# Patient Record
Sex: Female | Born: 1950 | Race: White | Hispanic: No | Smoking: Never smoker
Health system: Southern US, Community
[De-identification: ages and names within clinical notes are randomized; demographics above are authoritative.]

## PROBLEM LIST (undated history)

## (undated) DIAGNOSIS — J45909 Unspecified asthma, uncomplicated: Secondary | ICD-10-CM

## (undated) DIAGNOSIS — M069 Rheumatoid arthritis, unspecified: Secondary | ICD-10-CM

## (undated) DIAGNOSIS — G4733 Obstructive sleep apnea (adult) (pediatric): Secondary | ICD-10-CM

## (undated) DIAGNOSIS — H409 Unspecified glaucoma: Secondary | ICD-10-CM

## (undated) DIAGNOSIS — M797 Fibromyalgia: Secondary | ICD-10-CM

## (undated) DIAGNOSIS — F419 Anxiety disorder, unspecified: Secondary | ICD-10-CM

## (undated) DIAGNOSIS — I10 Essential (primary) hypertension: Secondary | ICD-10-CM

## (undated) HISTORY — PX: CHOLECYSTECTOMY: SHX55

## (undated) HISTORY — PX: BACK SURGERY: SHX140

## (undated) HISTORY — PX: CATARACT EXTRACTION: SUR2

## (undated) HISTORY — PX: KNEE ARTHROSCOPY: SUR90

## (undated) HISTORY — PX: OTHER SURGICAL HISTORY: SHX169

## (undated) HISTORY — PX: CERVICAL LAMINECTOMY: SHX94

## (undated) HISTORY — PX: TUBAL LIGATION: SHX77

## (undated) HISTORY — PX: ECTOPIC PREGNANCY SURGERY: SHX613

---

## 1998-10-20 ENCOUNTER — Encounter: Payer: Self-pay | Admitting: Neurological Surgery

## 1998-10-22 ENCOUNTER — Encounter: Payer: Self-pay | Admitting: Neurological Surgery

## 1998-10-22 ENCOUNTER — Inpatient Hospital Stay (HOSPITAL_COMMUNITY): Admission: RE | Admit: 1998-10-22 | Discharge: 1998-10-23 | Payer: Self-pay | Admitting: Neurological Surgery

## 2001-07-26 ENCOUNTER — Encounter: Payer: Self-pay | Admitting: Internal Medicine

## 2001-07-26 ENCOUNTER — Ambulatory Visit (HOSPITAL_COMMUNITY): Admission: RE | Admit: 2001-07-26 | Discharge: 2001-07-26 | Payer: Self-pay

## 2001-08-03 ENCOUNTER — Ambulatory Visit (HOSPITAL_COMMUNITY): Admission: RE | Admit: 2001-08-03 | Discharge: 2001-08-03 | Payer: Self-pay | Admitting: Family Medicine

## 2001-08-03 ENCOUNTER — Encounter: Payer: Self-pay | Admitting: Internal Medicine

## 2001-08-27 ENCOUNTER — Encounter (HOSPITAL_COMMUNITY): Admission: RE | Admit: 2001-08-27 | Discharge: 2001-09-26 | Payer: Self-pay | Admitting: Orthopedic Surgery

## 2002-12-10 ENCOUNTER — Ambulatory Visit (HOSPITAL_COMMUNITY): Admission: RE | Admit: 2002-12-10 | Discharge: 2002-12-10 | Payer: Self-pay | Admitting: Internal Medicine

## 2003-06-13 ENCOUNTER — Encounter (HOSPITAL_COMMUNITY): Admission: RE | Admit: 2003-06-13 | Discharge: 2003-07-13 | Payer: Self-pay | Admitting: Orthopedic Surgery

## 2004-03-03 ENCOUNTER — Encounter (HOSPITAL_COMMUNITY): Admission: RE | Admit: 2004-03-03 | Discharge: 2004-03-03 | Payer: Self-pay | Admitting: Internal Medicine

## 2004-05-11 ENCOUNTER — Encounter
Admission: RE | Admit: 2004-05-11 | Discharge: 2004-08-09 | Payer: Self-pay | Admitting: Physical Medicine and Rehabilitation

## 2004-11-26 ENCOUNTER — Ambulatory Visit (HOSPITAL_COMMUNITY): Admission: RE | Admit: 2004-11-26 | Discharge: 2004-11-26 | Payer: Self-pay | Admitting: Family Medicine

## 2005-05-25 ENCOUNTER — Ambulatory Visit (HOSPITAL_COMMUNITY): Admission: RE | Admit: 2005-05-25 | Discharge: 2005-05-25 | Payer: Self-pay | Admitting: Internal Medicine

## 2006-08-20 ENCOUNTER — Ambulatory Visit (HOSPITAL_COMMUNITY): Admission: RE | Admit: 2006-08-20 | Discharge: 2006-08-20 | Payer: Self-pay | Admitting: Internal Medicine

## 2007-02-04 ENCOUNTER — Emergency Department (HOSPITAL_COMMUNITY): Admission: EM | Admit: 2007-02-04 | Discharge: 2007-02-04 | Payer: Self-pay | Admitting: Emergency Medicine

## 2008-08-15 ENCOUNTER — Ambulatory Visit (HOSPITAL_COMMUNITY): Admission: RE | Admit: 2008-08-15 | Discharge: 2008-08-15 | Payer: Self-pay | Admitting: Internal Medicine

## 2008-10-13 ENCOUNTER — Ambulatory Visit (HOSPITAL_COMMUNITY): Admission: RE | Admit: 2008-10-13 | Discharge: 2008-10-13 | Payer: Self-pay | Admitting: Ophthalmology

## 2009-03-28 ENCOUNTER — Encounter: Admission: RE | Admit: 2009-03-28 | Discharge: 2009-03-28 | Payer: Self-pay | Admitting: Internal Medicine

## 2009-07-10 ENCOUNTER — Ambulatory Visit (HOSPITAL_COMMUNITY): Admission: RE | Admit: 2009-07-10 | Discharge: 2009-07-10 | Payer: Self-pay | Admitting: Surgery

## 2010-03-07 ENCOUNTER — Encounter: Payer: Self-pay | Admitting: Internal Medicine

## 2010-03-08 ENCOUNTER — Encounter: Payer: Self-pay | Admitting: Internal Medicine

## 2010-03-17 ENCOUNTER — Encounter: Payer: Self-pay | Admitting: Orthopaedic Surgery

## 2010-05-22 LAB — BASIC METABOLIC PANEL
BUN: 6 mg/dL (ref 6–23)
CO2: 27 mEq/L (ref 19–32)
Calcium: 9.2 mg/dL (ref 8.4–10.5)
Chloride: 106 mEq/L (ref 96–112)
Creatinine, Ser: 0.63 mg/dL (ref 0.4–1.2)
GFR calc Af Amer: >60 mL/min (ref >60)

## 2010-06-01 ENCOUNTER — Other Ambulatory Visit: Payer: Self-pay | Admitting: Orthopaedic Surgery

## 2010-06-01 DIAGNOSIS — M545 Low back pain: Secondary | ICD-10-CM

## 2010-06-04 ENCOUNTER — Ambulatory Visit
Admission: RE | Admit: 2010-06-04 | Discharge: 2010-06-04 | Disposition: A | Discharge status: Home / Self Care | Payer: Medicare Other | Source: Ambulatory Visit | Attending: Orthopaedic Surgery | Admitting: Orthopaedic Surgery

## 2010-06-04 DIAGNOSIS — M545 Low back pain: Secondary | ICD-10-CM

## 2010-06-11 ENCOUNTER — Encounter (HOSPITAL_COMMUNITY)
Admission: RE | Admit: 2010-06-11 | Discharge: 2010-06-11 | Disposition: A | Discharge status: Home / Self Care | Payer: Medicare Other | Source: Ambulatory Visit | Attending: Orthopaedic Surgery | Admitting: Orthopaedic Surgery

## 2010-06-11 ENCOUNTER — Ambulatory Visit (HOSPITAL_COMMUNITY)
Admission: RE | Admit: 2010-06-11 | Discharge: 2010-06-11 | Disposition: A | Discharge status: Home / Self Care | Payer: Medicare Other | Source: Ambulatory Visit | Attending: Orthopaedic Surgery | Admitting: Orthopaedic Surgery

## 2010-06-11 ENCOUNTER — Other Ambulatory Visit (HOSPITAL_COMMUNITY): Payer: Self-pay | Admitting: Orthopaedic Surgery

## 2010-06-11 DIAGNOSIS — Z01812 Encounter for preprocedural laboratory examination: Secondary | ICD-10-CM | POA: Insufficient documentation

## 2010-06-11 DIAGNOSIS — M48061 Spinal stenosis, lumbar region without neurogenic claudication: Secondary | ICD-10-CM | POA: Insufficient documentation

## 2010-06-11 DIAGNOSIS — Z01810 Encounter for preprocedural cardiovascular examination: Secondary | ICD-10-CM | POA: Insufficient documentation

## 2010-06-11 DIAGNOSIS — Z01818 Encounter for other preprocedural examination: Secondary | ICD-10-CM | POA: Insufficient documentation

## 2010-06-11 LAB — CBC
HCT: 41 % (ref 36.0–46.0)
Hemoglobin: 14 g/dL (ref 12.0–15.0)
MCH: 30.8 pg (ref 26.0–34.0)
MCHC: 34.1 g/dL (ref 30.0–36.0)
RDW: 13.3 % (ref 11.5–15.5)

## 2010-06-11 LAB — URINALYSIS, ROUTINE W REFLEX MICROSCOPIC
Bilirubin Urine: NEGATIVE
Glucose, UA: NEGATIVE mg/dL
Hgb urine dipstick: NEGATIVE
Ketones, ur: NEGATIVE mg/dL
Protein, ur: NEGATIVE mg/dL
Urobilinogen, UA: 0.2 mg/dL (ref 0.0–1.0)

## 2010-06-11 LAB — COMPREHENSIVE METABOLIC PANEL
AST: 18 U/L (ref 0–37)
BUN: 6 mg/dL (ref 6–23)
CO2: 27 mEq/L (ref 19–32)
Chloride: 103 mEq/L (ref 96–112)
Creatinine, Ser: 0.6 mg/dL (ref 0.4–1.2)
GFR calc non Af Amer: >60 mL/min (ref >60)
Glucose, Bld: 97 mg/dL (ref 70–99)
Total Bilirubin: 0.4 mg/dL (ref 0.3–1.2)

## 2010-06-11 LAB — SURGICAL PCR SCREEN: Staphylococcus aureus: NEGATIVE

## 2010-06-14 ENCOUNTER — Ambulatory Visit (HOSPITAL_COMMUNITY)
Admission: RE | Admit: 2010-06-14 | Discharge: 2010-06-15 | Disposition: A | Discharge status: Home / Self Care | Payer: Medicare Other | Source: Ambulatory Visit | Attending: Orthopaedic Surgery | Admitting: Orthopaedic Surgery

## 2010-06-14 ENCOUNTER — Ambulatory Visit (HOSPITAL_COMMUNITY): Payer: Medicare Other

## 2010-06-14 DIAGNOSIS — J45909 Unspecified asthma, uncomplicated: Secondary | ICD-10-CM | POA: Insufficient documentation

## 2010-06-14 DIAGNOSIS — IMO0001 Reserved for inherently not codable concepts without codable children: Secondary | ICD-10-CM | POA: Insufficient documentation

## 2010-06-14 DIAGNOSIS — M5126 Other intervertebral disc displacement, lumbar region: Secondary | ICD-10-CM | POA: Insufficient documentation

## 2010-06-14 DIAGNOSIS — M48061 Spinal stenosis, lumbar region without neurogenic claudication: Secondary | ICD-10-CM | POA: Insufficient documentation

## 2010-06-14 DIAGNOSIS — E669 Obesity, unspecified: Secondary | ICD-10-CM | POA: Insufficient documentation

## 2010-06-14 DIAGNOSIS — I10 Essential (primary) hypertension: Secondary | ICD-10-CM | POA: Insufficient documentation

## 2010-07-15 NOTE — Op Note (Signed)
NAME:  [name] [name], [name]                ACCOUNT NO.:  [id number]  MEDICAL RECORD NO.:  [id number]           PATIENT TYPE:  I  LOCATION:  5015                         FACILITY:  [facility]  PHYSICIAN:  Temprance Wyre C. Ophelia Charter, M.D.    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  PROCEDURE NOTE:  L2-3 and L3-4 decompression, L2-3 microdiskectomy.  ANESTHESIA:  GOT plus Marcaine local.  ESTIMATED BLOOD LOSS:  700 mL.  A 60 year old female with progressive increased pain with the progression since 2007 with severe stenosis at L2-3 and neurogenic claudication symptoms.  MRI scan showed 5-mm canal at the 2-3 level and moderate-to-severe stenosis of the level below L3-4.  SURGEON:  Rielle Schlauch C. Ophelia Charter, MD  ASSISTANT:  Wende Neighbors, PA, present for the entire case and medically necessary.  DRAINS:  One Hemovac.  After induction of general anesthesia orotracheal intubation, the patient placed prone on chest rolls on the flat [place] table.  Back was prepped with DuraPrep.  The head was raised slightly with the patient's blood pressure being more than adequate.  Back was prepped with DuraPrep.  The area was squared with towels, sterile skin marker, Betadine, Steri-Strips, laminectomy sheets and drapes.  Spinal needle localization showed that the lower needle was little bit below the 3-4 level.  Upper level was directly over the disk at 2-3.  Midline incision was made based on spinal needle localization.  Subperiosteal dissection on the lamina.  Deep self-retaining retractors were placed.  The patient had pulsatile arterial bleeders in numerous areas with venous oozing. Slow  meticulous hemostasis was performed and combination of bone wax where bone was removed.  Thrombin-soaked Gelfoam and patties were used. Decompression was started proximally in spinal.  Lateral lumbar x-ray was taken with Kocher placed on top of the bony decompression.  This was slightly high and the lamina was thinned in L2 removing bone until the ventral aspect the lamina was thinned.  Thick chunks of ligament were left.  Bone was removed using 2- and 3-mm Kerrisons going out laterally in line with the fall way of the dura on the on the right and left side. Bone was thinned next level down for the 3-4 decompression and there were all large overhanging spurs off the facets and thick chunks of ligament.  Gelfoam was used in the gutters and then continued to be oozing throughout the case despite attempts at hemostasis.  Bone was removal.  Pedicles were palpated, and central decompression was performed at the 3-4 level.  At 2-3, there was disk herniation with some portion of the fragment extruded caudally.  Due to the tightness, initially a Therapist, nutritional was used for [employer] or [employer] #4. Annulus was incised and immediately disk came out through the defect and was suctioned up with 8- or 9-mm sucker tip.  Hockey stick was not able  to reach down into the area.  Small ball-tip right-angle probe was used as a dural separator.  Nerve root was used to tease some disk fragments and suction them up until finally 9-degree hockey stick could be passed anterior to the dura without pressure.  No spurs were removed.  Nerve root was free.  No fragment out the foramina.  Some remaining spurs were removed from the gutter running up each side.  No areas remaining compression proximally.  Decompression was performed at both 2-3 and 3-4 level and the annulus was incised on the disk on the right side. Hemovac was placed through in-and-out technique through separate stab incision left above the fascia.   Fascia was loosely approximated with 0 Vicryl interrupted, 2-0 Vicryl in the subcutaneous tissue, and then skin staple closure in order to allow for any egress of fluid to prevent hematoma formation and extradural compression.  Postop dressing and ABDs were applied and tape.  The patient tolerated the procedure well, was transferred to recovery room in stable condition.     [name] [name] [name] C. Ophelia Charter, M.D.     MCY/MEDQ  D:  06/14/2010  T:  06/15/2010  Job:  [id number]  Electronically Signed by Annell Greening M.D. on 07/15/2010 06:26:41 PM

## 2010-11-19 LAB — COMPREHENSIVE METABOLIC PANEL
AST: 14
Albumin: 2.7 — ABNORMAL LOW
Calcium: 7.9 — ABNORMAL LOW
Chloride: 106
Creatinine, Ser: 0.56
GFR calc Af Amer: >60
Sodium: 139
Total Bilirubin: 0.4

## 2010-11-19 LAB — CBC
MCV: 93.4
Platelets: 329
WBC: 10.9 — ABNORMAL HIGH

## 2010-11-19 LAB — DIFFERENTIAL
Basophils Absolute: 0
Lymphocytes Relative: 11 — ABNORMAL LOW
Lymphs Abs: 1.2
Neutro Abs: 9 — ABNORMAL HIGH

## 2010-12-21 ENCOUNTER — Other Ambulatory Visit (HOSPITAL_COMMUNITY): Payer: Self-pay | Admitting: Internal Medicine

## 2010-12-21 ENCOUNTER — Other Ambulatory Visit: Payer: Self-pay | Admitting: Internal Medicine

## 2010-12-21 DIAGNOSIS — M25559 Pain in unspecified hip: Secondary | ICD-10-CM

## 2010-12-28 ENCOUNTER — Other Ambulatory Visit: Payer: Medicare Other

## 2010-12-30 ENCOUNTER — Other Ambulatory Visit: Payer: Medicare Other

## 2011-01-05 ENCOUNTER — Other Ambulatory Visit: Payer: Medicare Other

## 2011-01-10 ENCOUNTER — Other Ambulatory Visit: Payer: Medicare Other

## 2011-01-18 ENCOUNTER — Other Ambulatory Visit: Payer: Medicare Other

## 2011-01-30 ENCOUNTER — Encounter: Payer: Self-pay | Admitting: Emergency Medicine

## 2011-01-30 ENCOUNTER — Emergency Department (HOSPITAL_COMMUNITY)
Admission: EM | Admit: 2011-01-30 | Discharge: 2011-01-30 | Disposition: A | Discharge status: Home / Self Care | Payer: Medicare Other | Attending: Emergency Medicine | Admitting: Emergency Medicine

## 2011-01-30 DIAGNOSIS — M545 Low back pain, unspecified: Secondary | ICD-10-CM | POA: Insufficient documentation

## 2011-01-30 DIAGNOSIS — M25559 Pain in unspecified hip: Secondary | ICD-10-CM | POA: Insufficient documentation

## 2011-01-30 DIAGNOSIS — G8929 Other chronic pain: Secondary | ICD-10-CM | POA: Insufficient documentation

## 2011-01-30 DIAGNOSIS — M069 Rheumatoid arthritis, unspecified: Secondary | ICD-10-CM | POA: Insufficient documentation

## 2011-01-30 DIAGNOSIS — IMO0001 Reserved for inherently not codable concepts without codable children: Secondary | ICD-10-CM | POA: Insufficient documentation

## 2011-01-30 DIAGNOSIS — M5431 Sciatica, right side: Secondary | ICD-10-CM

## 2011-01-30 DIAGNOSIS — Z79899 Other long term (current) drug therapy: Secondary | ICD-10-CM | POA: Insufficient documentation

## 2011-01-30 DIAGNOSIS — I10 Essential (primary) hypertension: Secondary | ICD-10-CM | POA: Insufficient documentation

## 2011-01-30 HISTORY — DX: Fibromyalgia: M79.7

## 2011-01-30 HISTORY — DX: Obstructive sleep apnea (adult) (pediatric): G47.33

## 2011-01-30 HISTORY — DX: Essential (primary) hypertension: I10

## 2011-01-30 HISTORY — DX: Rheumatoid arthritis, unspecified: M06.9

## 2011-01-30 MED ORDER — NAPROXEN 500 MG PO TABS: 500.0000 mg | ORAL_TABLET | Freq: Two times a day (BID) | ORAL | Status: DC

## 2011-01-30 MED ORDER — HYDROMORPHONE HCL PF 2 MG/ML IJ SOLN
2.0000 mg | Freq: Once | INTRAMUSCULAR | Status: AC
  Administered 2011-01-30: 2 mg via INTRAMUSCULAR
  Filled 2011-01-30: qty 1

## 2011-01-30 MED ORDER — PREDNISONE 50 MG PO TABS: 50.0000 mg | ORAL_TABLET | Freq: Every day | ORAL | Status: AC

## 2011-01-30 MED ORDER — OXYCODONE-ACETAMINOPHEN 5-325 MG PO TABS: 2.0000 | ORAL_TABLET | ORAL | Status: AC | PRN

## 2011-01-30 NOTE — ED Notes (Signed)
Patient with c/o lower back pain, neck pain, and bilateral hip pain. Patient with history of back surgery in April. Patient did some work yesterday and woke up today with pain.

## 2011-01-30 NOTE — ED Provider Notes (Signed)
History    Scribed for Donnetta Hutching, MD, the patient was seen in room APAH1/APAH1. This chart was scribed by Katha Cabal.   CSN: [id number] Arrival date & time: 01/30/2011  6:32 PM   First MD Initiated Contact with Patient 01/30/11 1933      Chief Complaint  Patient presents with  . Back Pain  . Hip Pain    (Consider location/radiation/quality/duration/timing/severity/associated sxs/prior treatment) Patient is a 60 y.o. female presenting with back pain and hip pain. The history is provided by the patient. No language interpreter was used.  Back Pain  This is a chronic problem. The current episode started 12 to 24 hours ago. The problem occurs constantly. The problem has not changed since onset.The pain is present in the lumbar spine and gluteal region. The quality of the pain is described as aching. The pain is moderate. Exacerbated by: walking  She has tried nothing for the symptoms.  Hip Pain This is a chronic problem. The problem occurs constantly. The problem has not changed since onset.The symptoms are aggravated by walking.   Dr. Ophelia Charter performed back surgery in April.  Patient reports right buttocks pain.  Patient received shot from Dr. Ophelia Charter for pain about a month and a half ago with mild relief.  Patient states she took nothing for pain.    Past Medical History  Diagnosis Date  . Fibromyalgia   . Rheumatoid arthritis   . Hypertension   . OSA (obstructive sleep apnea)   . Ectopic pregnancy     Past Surgical History  Procedure Date  . Back surgery   . Tubal ligation   . Ectopic pregnancy surgery   . Vericose vein surgery   . Knee arthroscopy   . Cataract extraction   . Cholecystectomy     No family history on file.  History  Substance Use Topics  . Smoking status: Former Games developer  . Smokeless tobacco: Not on file  . Alcohol Use: No    OB History    Grav Para Term Preterm Abortions TAB SAB Ect Mult Living                  Review of Systems    Musculoskeletal: Positive for back pain.  All other systems reviewed and are negative.    Allergies  Review of patient's allergies indicates no known allergies.  Home Medications   Current Outpatient Rx  Name Route Sig Dispense Refill  . NAPROXEN 500 MG PO TABS Oral Take 1 tablet (500 mg total) by mouth 2 (two) times daily. 20 tablet 0  . OXYCODONE-ACETAMINOPHEN 5-325 MG PO TABS Oral Take 2 tablets by mouth every 4 (four) hours as needed for pain. 20 tablet 0  . PREDNISONE 50 MG PO TABS Oral Take 1 tablet (50 mg total) by mouth daily. 7 tablet 1    BP 142/87  Pulse 79  Temp(Src) 97.9 F (36.6 C) (Oral)  Resp 18  Ht 5' 3.5" (1.613 m)  Wt 190 lb (86.183 kg)  BMI 33.13 kg/m2  SpO2 97%  Physical Exam  Nursing note and vitals reviewed. Constitutional: She is oriented to person, place, and time. She appears well-developed and well-nourished. No distress.  HENT:  Head: Normocephalic and atraumatic.  Eyes: Conjunctivae and EOM are normal.  Neck: Neck supple.  Cardiovascular: Normal rate and regular rhythm.   No murmur heard. Pulmonary/Chest: Effort normal. No respiratory distress.  Abdominal: Soft. There is no tenderness.  Musculoskeletal: Normal range of motion. She exhibits no edema.  Right buttocks pain   Neurological: She is alert and oriented to person, place, and time. No sensory deficit.  Skin: Skin is warm and dry. No rash noted.  Psychiatric: She has a normal mood and affect. Her behavior is normal.    ED Course  Procedures (including critical care time)   DIAGNOSTIC STUDIES: Oxygen Saturation is 97% on room air, normal by my interpretation.     COORDINATION OF CARE: 8:11 PM  Physical exam complete.  Plan to discharge patient home.  Advised patient to follow up with Dr. Ophelia Charter.  Patient agrees with plan.      LABS / RADIOLOGY:   Labs Reviewed - No data to display No results found.   1. Sciatica of right side       MDM  Status post back  surgery April 30 by Dr Ophelia Charter.    Now with right sciatic pain. No bowel or bladder incontinence. Ambulatory. Will followup with orthopedic doctor this week       I personally performed the services described in this documentation, which was scribed in my presence. The recorded information has been reviewed and considered.    Donnetta Hutching, MD 01/30/11 2034

## 2011-01-30 NOTE — ED Notes (Signed)
Pt is alert and oriented x 4 with respirations even and unlabored.  NAD at this time.  Discharge instructions and Rx x 3 reviewed with pt and pt verbalized understanding.  Pt ambulated to POV and husband to transport pt home.

## 2011-02-16 DIAGNOSIS — Z79899 Other long term (current) drug therapy: Secondary | ICD-10-CM | POA: Diagnosis not present

## 2011-02-18 ENCOUNTER — Ambulatory Visit
Admission: RE | Admit: 2011-02-18 | Discharge: 2011-02-18 | Disposition: A | Discharge status: Home / Self Care | Payer: Medicare Other | Source: Ambulatory Visit | Attending: Internal Medicine | Admitting: Internal Medicine

## 2011-02-18 DIAGNOSIS — M25559 Pain in unspecified hip: Secondary | ICD-10-CM | POA: Diagnosis not present

## 2011-02-18 DIAGNOSIS — M25459 Effusion, unspecified hip: Secondary | ICD-10-CM | POA: Diagnosis not present

## 2011-02-22 DIAGNOSIS — Z6841 Body Mass Index (BMI) 40.0 and over, adult: Secondary | ICD-10-CM | POA: Diagnosis not present

## 2011-02-22 DIAGNOSIS — G4700 Insomnia, unspecified: Secondary | ICD-10-CM | POA: Diagnosis not present

## 2011-02-22 DIAGNOSIS — I10 Essential (primary) hypertension: Secondary | ICD-10-CM | POA: Diagnosis not present

## 2011-02-22 DIAGNOSIS — E669 Obesity, unspecified: Secondary | ICD-10-CM | POA: Diagnosis not present

## 2011-02-22 DIAGNOSIS — M25559 Pain in unspecified hip: Secondary | ICD-10-CM | POA: Diagnosis not present

## 2011-02-22 DIAGNOSIS — M169 Osteoarthritis of hip, unspecified: Secondary | ICD-10-CM | POA: Diagnosis not present

## 2011-02-22 DIAGNOSIS — J019 Acute sinusitis, unspecified: Secondary | ICD-10-CM | POA: Diagnosis not present

## 2011-03-08 DIAGNOSIS — Z79899 Other long term (current) drug therapy: Secondary | ICD-10-CM | POA: Diagnosis not present

## 2011-03-14 ENCOUNTER — Ambulatory Visit (HOSPITAL_COMMUNITY)
Admission: RE | Admit: 2011-03-14 | Discharge: 2011-03-14 | Disposition: A | Discharge status: Home / Self Care | Payer: Medicare Other | Source: Ambulatory Visit | Attending: Internal Medicine | Admitting: Internal Medicine

## 2011-03-14 ENCOUNTER — Other Ambulatory Visit (HOSPITAL_COMMUNITY): Payer: Self-pay | Admitting: Internal Medicine

## 2011-03-14 DIAGNOSIS — R609 Edema, unspecified: Secondary | ICD-10-CM | POA: Diagnosis not present

## 2011-03-14 DIAGNOSIS — M79609 Pain in unspecified limb: Secondary | ICD-10-CM | POA: Diagnosis not present

## 2011-03-14 DIAGNOSIS — I82819 Embolism and thrombosis of superficial veins of unspecified lower extremities: Secondary | ICD-10-CM | POA: Insufficient documentation

## 2011-03-14 DIAGNOSIS — M7989 Other specified soft tissue disorders: Secondary | ICD-10-CM | POA: Insufficient documentation

## 2011-03-21 DIAGNOSIS — Z79899 Other long term (current) drug therapy: Secondary | ICD-10-CM | POA: Diagnosis not present

## 2011-03-30 DIAGNOSIS — Z79899 Other long term (current) drug therapy: Secondary | ICD-10-CM | POA: Diagnosis not present

## 2011-03-31 DIAGNOSIS — M169 Osteoarthritis of hip, unspecified: Secondary | ICD-10-CM | POA: Diagnosis not present

## 2011-03-31 DIAGNOSIS — M25559 Pain in unspecified hip: Secondary | ICD-10-CM | POA: Diagnosis not present

## 2011-04-06 DIAGNOSIS — Z79899 Other long term (current) drug therapy: Secondary | ICD-10-CM | POA: Diagnosis not present

## 2011-05-12 DIAGNOSIS — M169 Osteoarthritis of hip, unspecified: Secondary | ICD-10-CM | POA: Diagnosis not present

## 2011-06-23 DIAGNOSIS — M169 Osteoarthritis of hip, unspecified: Secondary | ICD-10-CM | POA: Diagnosis not present

## 2011-06-23 DIAGNOSIS — M1610 Unilateral primary osteoarthritis, unspecified hip: Secondary | ICD-10-CM | POA: Diagnosis not present

## 2011-06-23 DIAGNOSIS — I10 Essential (primary) hypertension: Secondary | ICD-10-CM | POA: Diagnosis not present

## 2011-06-23 DIAGNOSIS — M25559 Pain in unspecified hip: Secondary | ICD-10-CM | POA: Diagnosis not present

## 2011-08-05 DIAGNOSIS — M76899 Other specified enthesopathies of unspecified lower limb, excluding foot: Secondary | ICD-10-CM | POA: Diagnosis not present

## 2011-09-14 DIAGNOSIS — M169 Osteoarthritis of hip, unspecified: Secondary | ICD-10-CM | POA: Diagnosis not present

## 2011-09-14 DIAGNOSIS — M25559 Pain in unspecified hip: Secondary | ICD-10-CM | POA: Diagnosis not present

## 2011-11-08 DIAGNOSIS — M25559 Pain in unspecified hip: Secondary | ICD-10-CM | POA: Diagnosis not present

## 2011-11-08 DIAGNOSIS — M169 Osteoarthritis of hip, unspecified: Secondary | ICD-10-CM | POA: Diagnosis not present

## 2011-12-28 DIAGNOSIS — H538 Other visual disturbances: Secondary | ICD-10-CM | POA: Diagnosis not present

## 2011-12-28 DIAGNOSIS — H2510 Age-related nuclear cataract, unspecified eye: Secondary | ICD-10-CM | POA: Diagnosis not present

## 2011-12-29 DIAGNOSIS — M25559 Pain in unspecified hip: Secondary | ICD-10-CM | POA: Diagnosis not present

## 2011-12-29 DIAGNOSIS — M545 Low back pain: Secondary | ICD-10-CM | POA: Diagnosis not present

## 2011-12-29 DIAGNOSIS — M169 Osteoarthritis of hip, unspecified: Secondary | ICD-10-CM | POA: Diagnosis not present

## 2011-12-29 DIAGNOSIS — M76899 Other specified enthesopathies of unspecified lower limb, excluding foot: Secondary | ICD-10-CM | POA: Diagnosis not present

## 2011-12-29 DIAGNOSIS — M25569 Pain in unspecified knee: Secondary | ICD-10-CM | POA: Diagnosis not present

## 2012-01-02 ENCOUNTER — Encounter (HOSPITAL_COMMUNITY): Payer: Self-pay | Admitting: Pharmacy Technician

## 2012-01-09 NOTE — Patient Instructions (Addendum)
Your procedure is scheduled on: 01/16/2012  Report to [facility] at  615       AM.  Call this number if you have problems the morning of surgery: [phone]   Do not eat food or drink liquids :After Midnight.      Take these medicines the morning of surgery with A SIP OF WATER: proventil,fioricet, cardiazem,allegra   Do not wear jewelry, make-up or nail polish.  Do not wear lotions, powders, or perfumes. You may wear deodorant.  Do not shave 48 hours prior to surgery.  Do not bring valuables to the hospital.  Contacts, dentures or bridgework may not be worn into surgery.  Leave suitcase in the car. After surgery it may be brought to your room.  For patients admitted to the hospital, checkout time is 11:00 AM the day of discharge.   Patients discharged the day of surgery will not be allowed to drive home.  :     Please read over the following fact sheets that you were given: Coughing and Deep Breathing, Surgical Site Infection Prevention, Anesthesia Post-op Instructions and Care and Recovery After Surgery    Cataract A cataract is a clouding of the lens of the eye. When a lens becomes cloudy, vision is reduced based on the degree and nature of the clouding. Many cataracts reduce vision to some degree. Some cataracts make people more near-sighted as they develop. Other cataracts increase glare. Cataracts that are ignored and become worse can sometimes look white. The white color can be seen through the pupil. CAUSES   Aging. However, cataracts may occur at any age, even in newborns.   Certain drugs.   Trauma to the eye.   Certain diseases such as diabetes.   Specific eye diseases such as chronic inflammation inside the eye or a sudden attack of a rare form of glaucoma.   Inherited or acquired medical problems.  SYMPTOMS   Gradual, progressive drop in vision in the affected eye.   Severe, rapid visual loss. This most often happens when trauma is the cause.  DIAGNOSIS  To detect  a cataract, an eye doctor examines the lens. Cataracts are best diagnosed with an exam of the eyes with the pupils enlarged (dilated) by drops.  TREATMENT  For an early cataract, vision may improve by using different eyeglasses or stronger lighting. If that does not help your vision, surgery is the only effective treatment. A cataract needs to be surgically removed when vision loss interferes with your everyday activities, such as driving, reading, or watching TV. A cataract may also have to be removed if it prevents examination or treatment of another eye problem. Surgery removes the cloudy lens and usually replaces it with a substitute lens (intraocular lens, IOL).  At a time when both you and your doctor agree, the cataract will be surgically removed. If you have cataracts in both eyes, only one is usually removed at a time. This allows the operated eye to heal and be out of danger from any possible problems after surgery (such as infection or poor wound healing). In rare cases, a cataract may be doing damage to your eye. In these cases, your caregiver may advise surgical removal right away. The vast majority of people who have cataract surgery have better vision afterward. HOME CARE INSTRUCTIONS  If you are not planning surgery, you may be asked to do the following:  Use different eyeglasses.   Use stronger or brighter lighting.   Ask your eye doctor  about reducing your medicine dose or changing medicines if it is thought that a medicine caused your cataract. Changing medicines does not make the cataract go away on its own.   Become familiar with your surroundings. Poor vision can lead to injury. Avoid bumping into things on the affected side. You are at a higher risk for tripping or falling.   Exercise extreme care when driving or operating machinery.   Wear sunglasses if you are sensitive to bright light or experiencing problems with glare.  SEEK IMMEDIATE MEDICAL CARE IF:   You have a  worsening or sudden vision loss.   You notice redness, swelling, or increasing pain in the eye.   You have a fever.  Document Released: 01/31/2005 Document Revised: 01/20/2011 Document Reviewed: 09/24/2010 [facility] Patient Information 2012 [place], [place].PATIENT INSTRUCTIONS POST-ANESTHESIA  IMMEDIATELY FOLLOWING SURGERY:  Do not drive or operate machinery for the first twenty four hours after surgery.  Do not make any important decisions for twenty four hours after surgery or while taking narcotic pain medications or sedatives.  If you develop intractable nausea and vomiting or a severe headache please notify your doctor immediately.  FOLLOW-UP:  Please make an appointment with your surgeon as instructed. You do not need to follow up with anesthesia unless specifically instructed to do so.  WOUND CARE INSTRUCTIONS (if applicable):  Keep a dry clean dressing on the anesthesia/puncture wound site if there is drainage.  Once the wound has quit draining you may leave it open to air.  Generally you should leave the bandage intact for twenty four hours unless there is drainage.  If the epidural site drains for more than 36-48 hours please call the anesthesia department.  QUESTIONS?:  Please feel free to call your physician or the hospital operator if you have any questions, and they will be happy to assist you.

## 2012-01-10 ENCOUNTER — Other Ambulatory Visit: Payer: Self-pay

## 2012-01-10 ENCOUNTER — Encounter (HOSPITAL_COMMUNITY): Payer: Self-pay

## 2012-01-10 ENCOUNTER — Encounter (HOSPITAL_COMMUNITY)
Admission: RE | Admit: 2012-01-10 | Discharge: 2012-01-10 | Disposition: A | Discharge status: Home / Self Care | Payer: Medicare Other | Source: Ambulatory Visit | Attending: Ophthalmology | Admitting: Ophthalmology

## 2012-01-10 DIAGNOSIS — J45909 Unspecified asthma, uncomplicated: Secondary | ICD-10-CM | POA: Diagnosis not present

## 2012-01-10 DIAGNOSIS — Z01812 Encounter for preprocedural laboratory examination: Secondary | ICD-10-CM | POA: Diagnosis not present

## 2012-01-10 DIAGNOSIS — I10 Essential (primary) hypertension: Secondary | ICD-10-CM | POA: Diagnosis not present

## 2012-01-10 DIAGNOSIS — Z01810 Encounter for preprocedural cardiovascular examination: Secondary | ICD-10-CM | POA: Diagnosis not present

## 2012-01-10 DIAGNOSIS — H2510 Age-related nuclear cataract, unspecified eye: Secondary | ICD-10-CM | POA: Diagnosis not present

## 2012-01-10 HISTORY — DX: Anxiety disorder, unspecified: F41.9

## 2012-01-10 HISTORY — DX: Unspecified glaucoma: H40.9

## 2012-01-10 HISTORY — DX: Unspecified asthma, uncomplicated: J45.909

## 2012-01-10 LAB — HEMOGLOBIN AND HEMATOCRIT, BLOOD
HCT: 35.4 % — ABNORMAL LOW (ref 36.0–46.0)
Hemoglobin: 11.1 g/dL — ABNORMAL LOW (ref 12.0–15.0)

## 2012-01-10 LAB — BASIC METABOLIC PANEL
CO2: 30 mEq/L (ref 19–32)
Chloride: 101 mEq/L (ref 96–112)
Glucose, Bld: 102 mg/dL — ABNORMAL HIGH (ref 70–99)
Sodium: 143 mEq/L (ref 135–145)

## 2012-01-10 MED ORDER — CYCLOPENTOLATE-PHENYLEPHRINE 0.2-1 % OP SOLN: 1.0000 [drp] | OPHTHALMIC | Status: DC

## 2012-01-10 MED ORDER — LIDOCAINE HCL 3.5 % OP GEL: 1.0000 "application " | Freq: Once | OPHTHALMIC | Status: DC

## 2012-01-10 MED ORDER — CYCLOPENTOLATE-PHENYLEPHRINE 0.2-1 % OP SOLN
OPHTHALMIC | Status: AC
  Filled 2012-01-10: qty 2

## 2012-01-10 MED ORDER — PHENYLEPHRINE HCL 2.5 % OP SOLN: 1.0000 [drp] | OPHTHALMIC | Status: DC

## 2012-01-10 MED ORDER — TETRACAINE HCL 0.5 % OP SOLN: 1.0000 [drp] | OPHTHALMIC | Status: DC

## 2012-01-16 ENCOUNTER — Encounter (HOSPITAL_COMMUNITY): Payer: Self-pay | Admitting: Anesthesiology

## 2012-01-16 ENCOUNTER — Ambulatory Visit (HOSPITAL_COMMUNITY)
Admission: RE | Admit: 2012-01-16 | Discharge: 2012-01-16 | Disposition: A | Discharge status: Home / Self Care | Payer: Medicare Other | Source: Ambulatory Visit | Attending: Ophthalmology | Admitting: Ophthalmology

## 2012-01-16 ENCOUNTER — Encounter (HOSPITAL_COMMUNITY): Payer: Self-pay | Admitting: *Deleted

## 2012-01-16 ENCOUNTER — Encounter (HOSPITAL_COMMUNITY)
Admission: RE | Disposition: A | Discharge status: Home / Self Care | Payer: Self-pay | Source: Ambulatory Visit | Attending: Ophthalmology

## 2012-01-16 ENCOUNTER — Ambulatory Visit (HOSPITAL_COMMUNITY): Payer: Medicare Other | Admitting: Anesthesiology

## 2012-01-16 DIAGNOSIS — I10 Essential (primary) hypertension: Secondary | ICD-10-CM | POA: Insufficient documentation

## 2012-01-16 DIAGNOSIS — Z01810 Encounter for preprocedural cardiovascular examination: Secondary | ICD-10-CM | POA: Insufficient documentation

## 2012-01-16 DIAGNOSIS — H538 Other visual disturbances: Secondary | ICD-10-CM | POA: Diagnosis not present

## 2012-01-16 DIAGNOSIS — Z01812 Encounter for preprocedural laboratory examination: Secondary | ICD-10-CM | POA: Diagnosis not present

## 2012-01-16 DIAGNOSIS — H2510 Age-related nuclear cataract, unspecified eye: Secondary | ICD-10-CM | POA: Diagnosis not present

## 2012-01-16 DIAGNOSIS — H269 Unspecified cataract: Secondary | ICD-10-CM | POA: Diagnosis not present

## 2012-01-16 DIAGNOSIS — J45909 Unspecified asthma, uncomplicated: Secondary | ICD-10-CM | POA: Diagnosis not present

## 2012-01-16 HISTORY — PX: CATARACT EXTRACTION W/PHACO: SHX586

## 2012-01-16 SURGERY — PHACOEMULSIFICATION, CATARACT, WITH IOL INSERTION
Anesthesia: Monitor Anesthesia Care | Site: Eye | Laterality: Right | Wound class: Clean

## 2012-01-16 MED ORDER — LIDOCAINE 3.5 % OP GEL OPTIME - NO CHARGE
OPHTHALMIC | Status: DC | PRN
  Administered 2012-01-16: 1 [drp] via OPHTHALMIC

## 2012-01-16 MED ORDER — LIDOCAINE HCL 3.5 % OP GEL: 1.0000 "application " | Freq: Once | OPHTHALMIC | Status: DC

## 2012-01-16 MED ORDER — KETOROLAC TROMETHAMINE 0.5 % OP SOLN
1.0000 [drp] | Freq: Once | OPHTHALMIC | Status: AC
  Administered 2012-01-16: 1 [drp] via OPHTHALMIC

## 2012-01-16 MED ORDER — TETRACAINE HCL 0.5 % OP SOLN: 1.0000 [drp] | Freq: Once | OPHTHALMIC | Status: DC

## 2012-01-16 MED ORDER — PROPOFOL 10 MG/ML IV EMUL
INTRAVENOUS | Status: AC
  Filled 2012-01-16: qty 20

## 2012-01-16 MED ORDER — MIDAZOLAM HCL 2 MG/2ML IJ SOLN
INTRAMUSCULAR | Status: AC
  Filled 2012-01-16: qty 2

## 2012-01-16 MED ORDER — MIDAZOLAM HCL 2 MG/2ML IJ SOLN
INTRAMUSCULAR | Status: AC
  Filled 2012-01-16: qty 4

## 2012-01-16 MED ORDER — GATIFLOXACIN 0.5 % OP SOLN
OPHTHALMIC | Status: AC
  Filled 2012-01-16: qty 2.5

## 2012-01-16 MED ORDER — GATIFLOXACIN 0.5 % OP SOLN OPTIME - NO CHARGE
OPHTHALMIC | Status: DC | PRN
  Administered 2012-01-16: 2 [drp] via OPHTHALMIC

## 2012-01-16 MED ORDER — KETOROLAC TROMETHAMINE 0.5 % OP SOLN
OPHTHALMIC | Status: AC
  Filled 2012-01-16: qty 5

## 2012-01-16 MED ORDER — GATIFLOXACIN 0.5 % OP SOLN
1.0000 [drp] | Freq: Once | OPHTHALMIC | Status: AC
  Administered 2012-01-16: 1 [drp] via OPHTHALMIC

## 2012-01-16 MED ORDER — EPINEPHRINE HCL 1 MG/ML IJ SOLN
INTRAOCULAR | Status: DC | PRN
  Administered 2012-01-16: 08:00:00

## 2012-01-16 MED ORDER — MIDAZOLAM HCL 2 MG/2ML IJ SOLN
1.0000 mg | INTRAMUSCULAR | Status: DC | PRN
  Administered 2012-01-16 (×2): 2 mg via INTRAVENOUS

## 2012-01-16 MED ORDER — LIDOCAINE HCL 3.5 % OP GEL
OPHTHALMIC | Status: AC
  Filled 2012-01-16: qty 5

## 2012-01-16 MED ORDER — NA HYALUR & NA CHOND-NA HYALUR 0.55-0.5 ML IO KIT
PACK | INTRAOCULAR | Status: DC | PRN
  Administered 2012-01-16: 1 via OPHTHALMIC

## 2012-01-16 MED ORDER — CYCLOPENTOLATE-PHENYLEPHRINE 0.2-1 % OP SOLN
1.0000 [drp] | Freq: Once | OPHTHALMIC | Status: AC
  Administered 2012-01-16: 1 [drp] via OPHTHALMIC

## 2012-01-16 MED ORDER — PROPOFOL 10 MG/ML IV BOLUS
INTRAVENOUS | Status: DC | PRN
  Administered 2012-01-16 (×6): 10 mg via INTRAVENOUS

## 2012-01-16 MED ORDER — MIDAZOLAM HCL 5 MG/5ML IJ SOLN
INTRAMUSCULAR | Status: DC | PRN
  Administered 2012-01-16: 2 mg via INTRAVENOUS

## 2012-01-16 MED ORDER — TETRACAINE 0.5 % OP SOLN OPTIME - NO CHARGE
OPHTHALMIC | Status: DC | PRN
  Administered 2012-01-16: 2 [drp] via OPHTHALMIC

## 2012-01-16 MED ORDER — EPINEPHRINE HCL 1 MG/ML IJ SOLN
INTRAMUSCULAR | Status: AC
  Filled 2012-01-16: qty 1

## 2012-01-16 MED ORDER — TETRACAINE HCL 0.5 % OP SOLN
OPHTHALMIC | Status: AC
  Filled 2012-01-16: qty 2

## 2012-01-16 MED ORDER — BSS IO SOLN
INTRAOCULAR | Status: DC | PRN
  Administered 2012-01-16: 15 mL via INTRAOCULAR

## 2012-01-16 MED ORDER — LIDOCAINE HCL (PF) 1 % IJ SOLN
INTRAMUSCULAR | Status: AC
  Filled 2012-01-16: qty 5

## 2012-01-16 MED ORDER — LIDOCAINE HCL 1 % IJ SOLN
INTRAMUSCULAR | Status: DC | PRN
  Administered 2012-01-16: 20 mg via INTRADERMAL

## 2012-01-16 MED ORDER — LACTATED RINGERS IV SOLN
INTRAVENOUS | Status: DC
  Administered 2012-01-16: 1000 mL via INTRAVENOUS

## 2012-01-16 MED ORDER — CYCLOPENTOLATE-PHENYLEPHRINE 0.2-1 % OP SOLN
OPHTHALMIC | Status: AC
  Filled 2012-01-16: qty 2

## 2012-01-16 SURGICAL SUPPLY — 29 items
CAPSULAR TENSION RING-AMO (OPHTHALMIC RELATED) IMPLANT
CLOTH BEACON ORANGE TIMEOUT ST (SAFETY) ×1 IMPLANT
GLOVE BIO SURGEON STRL SZ7.5 (GLOVE) IMPLANT
GLOVE BIOGEL M 6.5 STRL (GLOVE) IMPLANT
GLOVE BIOGEL PI IND STRL 6.5 (GLOVE) IMPLANT
GLOVE BIOGEL PI IND STRL 7.0 (GLOVE) IMPLANT
GLOVE BIOGEL PI INDICATOR 6.5 (GLOVE) ×2
GLOVE BIOGEL PI INDICATOR 7.0 (GLOVE)
GLOVE ECLIPSE 6.5 STRL STRAW (GLOVE) IMPLANT
GLOVE ECLIPSE 7.5 STRL STRAW (GLOVE) IMPLANT
GLOVE EXAM NITRILE LRG STRL (GLOVE) IMPLANT
GLOVE EXAM NITRILE MD LF STRL (GLOVE) ×1 IMPLANT
GLOVE SKINSENSE NS SZ6.5 (GLOVE)
GLOVE SKINSENSE NS SZ7.0 (GLOVE)
GLOVE SKINSENSE STRL SZ6.5 (GLOVE) IMPLANT
GLOVE SKINSENSE STRL SZ7.0 (GLOVE) IMPLANT
GOWN SRG XL XLNG 56XLVL 4 (GOWN DISPOSABLE) IMPLANT
GOWN STRL NON-REIN XL XLG LVL4 (GOWN DISPOSABLE) ×2
INST SET CATARACT ~~LOC~~ (KITS) ×2 IMPLANT
KIT VITRECTOMY (OPHTHALMIC RELATED) IMPLANT
PAD ARMBOARD 7.5X6 YLW CONV (MISCELLANEOUS) ×1 IMPLANT
PROC W NO LENS (INTRAOCULAR LENS)
PROC W SPEC LENS (INTRAOCULAR LENS)
PROCESS W NO LENS (INTRAOCULAR LENS) IMPLANT
PROCESS W SPEC LENS (INTRAOCULAR LENS) IMPLANT
RING MALYGIN (MISCELLANEOUS) IMPLANT
SIGHTPATH CAT PROC W REG LENS (Ophthalmic Related) ×2 IMPLANT
VISCOELASTIC ADDITIONAL (OPHTHALMIC RELATED) IMPLANT
WATER STERILE IRR 250ML POUR (IV SOLUTION) ×1 IMPLANT

## 2012-01-16 NOTE — Anesthesia Procedure Notes (Signed)
Procedure Name: MAC Date/Time: 01/16/2012 7:44 AM Performed by: Franco Nones Pre-anesthesia Checklist: Patient identified, Emergency Drugs available, Suction available, Timeout performed and Patient being monitored Patient Re-evaluated:Patient Re-evaluated prior to inductionOxygen Delivery Method: Nasal Cannula

## 2012-01-16 NOTE — Anesthesia Postprocedure Evaluation (Signed)
  Anesthesia Post-op Note  Patient: [name] [name]  Procedure(s) Performed: Procedure(s) (LRB): CATARACT EXTRACTION PHACO AND INTRAOCULAR LENS PLACEMENT (IOC) (Right)  Patient Location:  Short Stay  Anesthesia Type: MAC  Level of Consciousness: awake  Airway and Oxygen Therapy: Patient Spontanous Breathing  Post-op Pain: none  Post-op Assessment: Post-op Vital signs reviewed, Patient's Cardiovascular Status Stable, Respiratory Function Stable, Patent Airway, No signs of Nausea or vomiting and Pain level controlled  Post-op Vital Signs: Reviewed and stable  Complications: No apparent anesthesia complications

## 2012-01-16 NOTE — H&P (Signed)
I have reviewed the pre printed H&P, the patient was re-examined, and I have identified no significant interval changes in the patient's medical condition.  There is no change in the plan of care since the history and physical of record. 

## 2012-01-16 NOTE — Anesthesia Preprocedure Evaluation (Signed)
Anesthesia Evaluation  Patient identified by MRN, date of birth, ID band Patient awake    Reviewed: Allergy & Precautions, H&P , NPO status , Patient's Chart, lab work & pertinent test results  History of Anesthesia Complications Negative for: history of anesthetic complications  Airway Mallampati: II      Dental   Pulmonary asthma , sleep apnea ,  breath sounds clear to auscultation        Cardiovascular hypertension, Pt. on medications Rhythm:Regular     Neuro/Psych Anxiety    GI/Hepatic negative GI ROS,   Endo/Other    Renal/GU      Musculoskeletal   Abdominal   Peds  Hematology   Anesthesia Other Findings   Reproductive/Obstetrics                           Anesthesia Physical Anesthesia Plan  ASA: III  Anesthesia Plan: MAC   Post-op Pain Management:    Induction: Intravenous  Airway Management Planned: Nasal Cannula  Additional Equipment:   Intra-op Plan:   Post-operative Plan:   Informed Consent: I have reviewed the patients History and Physical, chart, labs and discussed the procedure including the risks, benefits and alternatives for the proposed anesthesia with the patient or authorized representative who has indicated his/her understanding and acceptance.     Plan Discussed with:   Anesthesia Plan Comments:         Anesthesia Quick Evaluation

## 2012-01-16 NOTE — Transfer of Care (Signed)
Immediate Anesthesia Transfer of Care Note  Patient: [name] [name]  Procedure(s) Performed: Procedure(s) (LRB): CATARACT EXTRACTION PHACO AND INTRAOCULAR LENS PLACEMENT (IOC) (Right)  Patient Location: Shortstay  Anesthesia Type: MAC  Level of Consciousness: awake  Airway & Oxygen Therapy: Patient Spontanous Breathing   Post-op Assessment: Report given to PACU RN, Post -op Vital signs reviewed and stable and Patient moving all extremities  Post vital signs: Reviewed and stable  Complications: No apparent anesthesia complications

## 2012-01-16 NOTE — Op Note (Signed)
See scanned note done today 

## 2012-01-16 NOTE — Brief Op Note (Signed)
01/16/2012  9:20 AM  PATIENT:  [name] [name]  61 y.o. female  PRE-OPERATIVE DIAGNOSIS:  nuclear cataract right eye  POST-OPERATIVE DIAGNOSIS:  nuclear cataract right eye  PROCEDURE:  Procedure(s): CATARACT EXTRACTION PHACO AND INTRAOCULAR LENS PLACEMENT (IOC)  SURGEON:  Surgeon(s): Susa Simmonds, MD  ASSISTANTS:  Laurena Bering, CST   ANESTHESIA STAFF: Franco Nones, CRNA - CRNA Laurene Footman, MD - Anesthesiologist  ANESTHESIA:   topical and MAC  REQUESTED LENS POWER: 13.0  LENS IMPLANT INFORMATION:  [employer] [id number]  S/n [id number]  Exp  01/2015  CUMULATIVE DISSIPATED ENERGY:14.05  INDICATIONS:see H&P  OP FINDINGS:moderately dense cataract OD  COMPLICATIONS:None  DICTATION #: see scanned op note done today  PLAN OF CARE: see H&P  PATIENT DISPOSITION:  Short Stay

## 2012-01-17 ENCOUNTER — Encounter (HOSPITAL_COMMUNITY): Payer: Self-pay | Admitting: Ophthalmology

## 2012-04-06 DIAGNOSIS — M76899 Other specified enthesopathies of unspecified lower limb, excluding foot: Secondary | ICD-10-CM | POA: Diagnosis not present

## 2012-06-18 ENCOUNTER — Other Ambulatory Visit (HOSPITAL_COMMUNITY): Payer: Self-pay | Admitting: Internal Medicine

## 2012-06-18 DIAGNOSIS — L02219 Cutaneous abscess of trunk, unspecified: Secondary | ICD-10-CM | POA: Diagnosis not present

## 2012-06-18 DIAGNOSIS — Z6841 Body Mass Index (BMI) 40.0 and over, adult: Secondary | ICD-10-CM | POA: Diagnosis not present

## 2012-06-18 DIAGNOSIS — I10 Essential (primary) hypertension: Secondary | ICD-10-CM | POA: Diagnosis not present

## 2012-06-18 DIAGNOSIS — G43909 Migraine, unspecified, not intractable, without status migrainosus: Secondary | ICD-10-CM | POA: Diagnosis not present

## 2012-06-18 DIAGNOSIS — L03319 Cellulitis of trunk, unspecified: Secondary | ICD-10-CM | POA: Diagnosis not present

## 2012-06-18 DIAGNOSIS — Z0000 Encounter for general adult medical examination without abnormal findings: Secondary | ICD-10-CM

## 2012-06-28 ENCOUNTER — Telehealth: Payer: Self-pay

## 2012-06-29 ENCOUNTER — Ambulatory Visit (HOSPITAL_COMMUNITY): Payer: Medicare Other

## 2012-07-05 NOTE — Telephone Encounter (Signed)
Pt was referred by Dr. Sherwood Gambler for screening colonoscopy. OV with Gerrit Halls, NP on 08/09/2012 at 2:30 PM. ( Pt requested this far out).

## 2012-08-09 ENCOUNTER — Ambulatory Visit: Payer: Medicare Other | Admitting: Gastroenterology

## 2012-08-27 ENCOUNTER — Ambulatory Visit: Payer: Medicare Other | Admitting: Gastroenterology

## 2012-08-27 ENCOUNTER — Telehealth: Payer: Self-pay | Admitting: Gastroenterology

## 2012-08-27 NOTE — Telephone Encounter (Signed)
Please send note to reschedule.  

## 2012-08-27 NOTE — Telephone Encounter (Signed)
Pt was a no show

## 2012-08-29 ENCOUNTER — Encounter: Payer: Self-pay | Admitting: General Practice

## 2012-08-29 NOTE — Telephone Encounter (Signed)
Letter mailed

## 2012-11-23 DIAGNOSIS — Z23 Encounter for immunization: Secondary | ICD-10-CM | POA: Diagnosis not present

## 2012-12-28 ENCOUNTER — Telehealth (HOSPITAL_COMMUNITY): Payer: Self-pay | Admitting: Dietician

## 2012-12-28 NOTE — Telephone Encounter (Signed)
Called back at 1010. Registered to attend DM class on 01/15/13 at 1000.

## 2012-12-28 NOTE — Telephone Encounter (Signed)
Received voicemail left on 12/27/12 at 1059. 

## 2013-01-02 ENCOUNTER — Telehealth (HOSPITAL_COMMUNITY): Payer: Self-pay | Admitting: Dietician

## 2013-01-02 NOTE — Telephone Encounter (Signed)
Received voicemail left at 1101. Desires to reschedule DM class appointment.

## 2013-01-03 NOTE — Telephone Encounter (Signed)
Called at 1006. Scheduled for 02/19/13 DM Class at 1000.

## 2013-01-08 ENCOUNTER — Emergency Department (HOSPITAL_COMMUNITY)
Admission: EM | Admit: 2013-01-08 | Discharge: 2013-01-08 | Disposition: A | Discharge status: Home / Self Care | Payer: Medicare Other | Attending: Emergency Medicine | Admitting: Emergency Medicine

## 2013-01-08 ENCOUNTER — Emergency Department (HOSPITAL_COMMUNITY): Payer: Medicare Other

## 2013-01-08 ENCOUNTER — Encounter (HOSPITAL_COMMUNITY): Payer: Self-pay | Admitting: Emergency Medicine

## 2013-01-08 DIAGNOSIS — R05 Cough: Secondary | ICD-10-CM | POA: Diagnosis not present

## 2013-01-08 DIAGNOSIS — Z9981 Dependence on supplemental oxygen: Secondary | ICD-10-CM | POA: Diagnosis not present

## 2013-01-08 DIAGNOSIS — Z79899 Other long term (current) drug therapy: Secondary | ICD-10-CM | POA: Insufficient documentation

## 2013-01-08 DIAGNOSIS — J45901 Unspecified asthma with (acute) exacerbation: Secondary | ICD-10-CM | POA: Diagnosis not present

## 2013-01-08 DIAGNOSIS — I10 Essential (primary) hypertension: Secondary | ICD-10-CM | POA: Insufficient documentation

## 2013-01-08 DIAGNOSIS — J209 Acute bronchitis, unspecified: Secondary | ICD-10-CM | POA: Diagnosis not present

## 2013-01-08 DIAGNOSIS — G4733 Obstructive sleep apnea (adult) (pediatric): Secondary | ICD-10-CM | POA: Diagnosis not present

## 2013-01-08 DIAGNOSIS — F411 Generalized anxiety disorder: Secondary | ICD-10-CM | POA: Insufficient documentation

## 2013-01-08 DIAGNOSIS — Z8739 Personal history of other diseases of the musculoskeletal system and connective tissue: Secondary | ICD-10-CM | POA: Insufficient documentation

## 2013-01-08 DIAGNOSIS — R062 Wheezing: Secondary | ICD-10-CM | POA: Diagnosis not present

## 2013-01-08 MED ORDER — PREDNISONE 50 MG PO TABS
60.0000 mg | ORAL_TABLET | Freq: Once | ORAL | Status: AC
  Administered 2013-01-08: 60 mg via ORAL
  Filled 2013-01-08 (×2): qty 1

## 2013-01-08 MED ORDER — PREDNISONE 10 MG PO TABS: ORAL_TABLET | ORAL | Status: DC

## 2013-01-08 MED ORDER — HYDROCOD POLST-CHLORPHEN POLST 10-8 MG/5ML PO LQCR: 5.0000 mL | Freq: Two times a day (BID) | ORAL | Status: DC

## 2013-01-08 MED ORDER — BENZONATATE 200 MG PO CAPS: 200.0000 mg | ORAL_CAPSULE | Freq: Three times a day (TID) | ORAL | Status: DC | PRN

## 2013-01-08 MED ORDER — AZITHROMYCIN 250 MG PO TABS: ORAL_TABLET | ORAL | Status: DC

## 2013-01-08 MED ORDER — ALBUTEROL SULFATE HFA 108 (90 BASE) MCG/ACT IN AERS
2.0000 | INHALATION_SPRAY | Freq: Once | RESPIRATORY_TRACT | Status: AC
  Administered 2013-01-08: 2 via RESPIRATORY_TRACT
  Filled 2013-01-08: qty 6.7

## 2013-01-08 NOTE — ED Notes (Signed)
Pt c/o nonproductive cough x 3 days and low grade fever.  Denies pain or SOB.

## 2013-01-08 NOTE — ED Provider Notes (Signed)
CSN: [id number]     Arrival date & time 01/08/13  1306 History   First MD Initiated Contact with Patient 01/08/13 1448     Chief Complaint  Patient presents with  . Cough   (Consider location/radiation/quality/duration/timing/severity/associated sxs/prior Treatment) HPI Comments: TASHANNA DOLIN is a 62 y.o. Female with a history significant for asthma presenting with a 3 day history of nonproductive cough, wheezing, hoarse voice, subjective fever, nasal congestion with thick  Yellow discharge and mild sore throat, which she blames on the cough.  She takes albuterol tablets tid prn wheezing but states they never seem to help with her symptoms.  She denies shortness of breath and chest pain, also has had on nausea, vomiting or abdominal pain.       The history is provided by the patient.    Past Medical History  Diagnosis Date  . Fibromyalgia   . Rheumatoid arthritis(714.0)   . Hypertension   . Ectopic pregnancy   . Glaucoma   . Anxiety   . Asthma   . OSA (obstructive sleep apnea)     o2 at 2.5 liters at night   Past Surgical History  Procedure Laterality Date  . Back surgery    . Tubal ligation    . Ectopic pregnancy surgery    . Vericose vein surgery    . Knee arthroscopy    . Cataract extraction    . Cholecystectomy    . Cervical laminectomy      with plating  . Cataract extraction w/phaco  01/16/2012    Procedure: CATARACT EXTRACTION PHACO AND INTRAOCULAR LENS PLACEMENT (IOC);  Surgeon: Susa Simmonds, MD;  Location: [facility];  Service: Ophthalmology;  Laterality: Right;  CDE: 14.05   No family history on file. History  Substance Use Topics  . Smoking status: Never Smoker   . Smokeless tobacco: Not on file  . Alcohol Use: No   OB History   Grav Para Term Preterm Abortions TAB SAB Ect Mult Living                 Review of Systems  Constitutional: Negative for fever.  HENT: Positive for rhinorrhea. Negative for congestion, postnasal drip and sore throat.    Eyes: Negative.   Respiratory: Positive for cough and wheezing. Negative for chest tightness and shortness of breath.   Cardiovascular: Negative for chest pain.  Gastrointestinal: Negative for nausea and abdominal pain.  Genitourinary: Negative.   Musculoskeletal: Negative for arthralgias, joint swelling and neck pain.  Skin: Negative.  Negative for rash and wound.  Neurological: Negative for dizziness, weakness, light-headedness, numbness and headaches.  Psychiatric/Behavioral: Negative.     Allergies  Review of patient's allergies indicates no known allergies.  Home Medications   Current Outpatient Rx  Name  Route  Sig  Dispense  Refill  . albuterol (PROVENTIL) 2 MG tablet   Oral   Take 2 mg by mouth 3 (three) times daily.         Marland Kitchen azithromycin (ZITHROMAX) 250 MG tablet      Take 2 tablets by mouth on day one followed by one tablet daily for 4 days.   6 tablet   0   . benzonatate (TESSALON) 200 MG capsule   Oral   Take 1 capsule (200 mg total) by mouth 3 (three) times daily as needed for cough.   30 capsule   0   . buprenorphine-naloxone (SUBOXONE) 8-2 MG SUBL   Sublingual   Place 1 tablet under the  tongue 4 (four) times daily as needed. Pain.         . butalbital-acetaminophen-caffeine (FIORICET, ESGIC) 50-325-40 MG per tablet   Oral   Take 1 tablet by mouth 4 (four) times daily.         Marland Kitchen diltiazem (CARDIZEM) 120 MG tablet   Oral   Take 180 mg by mouth daily.         . fexofenadine (ALLEGRA) 180 MG tablet   Oral   Take 180 mg by mouth daily.         . Menthol-Methyl Salicylate (GNP MUSCLE RUB EX)   Apply externally   Apply 1 application topically daily as needed. Pain.         . predniSONE (DELTASONE) 10 MG tablet      6, 5, 4, 3, 2 then 1 tablet by mouth daily for 6 days total.   21 tablet   0   . Tetrahydrozoline HCl (VISINE OP)   Ophthalmic   Apply 1 drop to eye every morning.          BP 129/71  Pulse 99  Temp(Src) 99.9 F  (37.7 C) (Oral)  Resp 24  Ht 5\' 3"  (1.6 m)  Wt 190 lb (86.183 kg)  BMI 33.67 kg/m2  SpO2 90% Physical Exam  Nursing note and vitals reviewed. Constitutional: She appears well-developed and well-nourished.  HENT:  Head: Normocephalic and atraumatic.  Mouth/Throat: Oropharynx is clear and moist.  Eyes: Conjunctivae are normal.  Neck: Normal range of motion.  Cardiovascular: Normal rate, regular rhythm, normal heart sounds and intact distal pulses.   Pulmonary/Chest: Effort normal. She has wheezes in the right lower field, the left upper field, the left middle field and the left lower field. She has no rhonchi. She has no rales.  Abdominal: Soft. Bowel sounds are normal. There is no tenderness.  Musculoskeletal: Normal range of motion. She exhibits no edema.  Neurological: She is alert.  Skin: Skin is warm and dry.  Psychiatric: She has a normal mood and affect.    ED Course  Procedures (including critical care time) Labs Review Labs Reviewed - No data to display Imaging Review Dg Chest 2 View  01/08/2013   CLINICAL DATA:  Cough and congestion.  EXAM: CHEST  2 VIEW  COMPARISON:  PA and lateral chest 06/11/2010.  FINDINGS: Mild subsegmental atelectasis is seen in the lung bases. Lungs are otherwise clear. Heart size is upper normal. No pneumothorax or pleural fluid.  IMPRESSION: No acute disease.   Electronically Signed   By: Drusilla Kanner M.D.   On: 01/08/2013 13:25    EKG Interpretation   None       MDM   1. Bronchitis with bronchospasm    Pt was given prednisone and a dose with an albuterol MDI with significant improvement in wheezing and improved lung aeration.  She felt a better after receiving this medicine.  She was prescribed a six-day prednisone taper, given that albuterol MDI and also placed on Zithromax.  She was encouraged rest, increase fluid intake, prescribed Tessalon to help her with a coughing.  Follow up with her PCP or return here for worsen  symptoms.    Burgess Amor, PA-C 01/08/13 1600

## 2013-01-09 NOTE — ED Provider Notes (Signed)
Medical screening examination/treatment/procedure(s) were performed by non-physician practitioner and as supervising physician I was immediately available for consultation/collaboration.  EKG Interpretation   None         Altus Zaino M Barbarita Hutmacher, DO 01/09/13 2156 

## 2013-03-07 DIAGNOSIS — M545 Low back pain, unspecified: Secondary | ICD-10-CM | POA: Diagnosis not present

## 2013-03-07 DIAGNOSIS — M47817 Spondylosis without myelopathy or radiculopathy, lumbosacral region: Secondary | ICD-10-CM | POA: Diagnosis not present

## 2013-03-07 DIAGNOSIS — M76899 Other specified enthesopathies of unspecified lower limb, excluding foot: Secondary | ICD-10-CM | POA: Diagnosis not present

## 2013-04-25 DIAGNOSIS — G43909 Migraine, unspecified, not intractable, without status migrainosus: Secondary | ICD-10-CM | POA: Diagnosis not present

## 2013-04-25 DIAGNOSIS — I10 Essential (primary) hypertension: Secondary | ICD-10-CM | POA: Diagnosis not present

## 2013-04-25 DIAGNOSIS — Z6841 Body Mass Index (BMI) 40.0 and over, adult: Secondary | ICD-10-CM | POA: Diagnosis not present

## 2013-04-25 DIAGNOSIS — K219 Gastro-esophageal reflux disease without esophagitis: Secondary | ICD-10-CM | POA: Diagnosis not present

## 2013-12-02 DIAGNOSIS — Z23 Encounter for immunization: Secondary | ICD-10-CM | POA: Diagnosis not present

## 2013-12-18 DIAGNOSIS — I10 Essential (primary) hypertension: Secondary | ICD-10-CM | POA: Diagnosis not present

## 2013-12-18 DIAGNOSIS — Z23 Encounter for immunization: Secondary | ICD-10-CM | POA: Diagnosis not present

## 2013-12-18 DIAGNOSIS — K219 Gastro-esophageal reflux disease without esophagitis: Secondary | ICD-10-CM | POA: Diagnosis not present

## 2013-12-18 DIAGNOSIS — Z6841 Body Mass Index (BMI) 40.0 and over, adult: Secondary | ICD-10-CM | POA: Diagnosis not present

## 2013-12-18 DIAGNOSIS — J0100 Acute maxillary sinusitis, unspecified: Secondary | ICD-10-CM | POA: Diagnosis not present

## 2013-12-18 DIAGNOSIS — G43909 Migraine, unspecified, not intractable, without status migrainosus: Secondary | ICD-10-CM | POA: Diagnosis not present

## 2014-03-28 DIAGNOSIS — Z6841 Body Mass Index (BMI) 40.0 and over, adult: Secondary | ICD-10-CM | POA: Diagnosis not present

## 2014-03-28 DIAGNOSIS — I10 Essential (primary) hypertension: Secondary | ICD-10-CM | POA: Diagnosis not present

## 2014-03-28 DIAGNOSIS — G894 Chronic pain syndrome: Secondary | ICD-10-CM | POA: Diagnosis not present

## 2014-04-27 ENCOUNTER — Emergency Department (HOSPITAL_COMMUNITY)
Admission: EM | Admit: 2014-04-27 | Discharge: 2014-04-27 | Discharge status: Absent without leave | Payer: BC Managed Care – PPO

## 2014-08-28 DIAGNOSIS — G43709 Chronic migraine without aura, not intractable, without status migrainosus: Secondary | ICD-10-CM | POA: Diagnosis not present

## 2014-08-28 DIAGNOSIS — K219 Gastro-esophageal reflux disease without esophagitis: Secondary | ICD-10-CM | POA: Diagnosis not present

## 2014-08-28 DIAGNOSIS — J0100 Acute maxillary sinusitis, unspecified: Secondary | ICD-10-CM | POA: Diagnosis not present

## 2014-08-28 DIAGNOSIS — Z6841 Body Mass Index (BMI) 40.0 and over, adult: Secondary | ICD-10-CM | POA: Diagnosis not present

## 2014-08-28 DIAGNOSIS — Z1389 Encounter for screening for other disorder: Secondary | ICD-10-CM | POA: Diagnosis not present

## 2014-08-28 DIAGNOSIS — I10 Essential (primary) hypertension: Secondary | ICD-10-CM | POA: Diagnosis not present

## 2014-12-09 DIAGNOSIS — Z23 Encounter for immunization: Secondary | ICD-10-CM | POA: Diagnosis not present

## 2015-04-27 DIAGNOSIS — E669 Obesity, unspecified: Secondary | ICD-10-CM | POA: Diagnosis not present

## 2015-04-27 DIAGNOSIS — F419 Anxiety disorder, unspecified: Secondary | ICD-10-CM | POA: Diagnosis not present

## 2015-04-27 DIAGNOSIS — K219 Gastro-esophageal reflux disease without esophagitis: Secondary | ICD-10-CM | POA: Diagnosis not present

## 2015-04-27 DIAGNOSIS — Z1389 Encounter for screening for other disorder: Secondary | ICD-10-CM | POA: Diagnosis not present

## 2015-04-27 DIAGNOSIS — I10 Essential (primary) hypertension: Secondary | ICD-10-CM | POA: Diagnosis not present

## 2015-04-27 DIAGNOSIS — G894 Chronic pain syndrome: Secondary | ICD-10-CM | POA: Diagnosis not present

## 2015-04-27 DIAGNOSIS — Z6841 Body Mass Index (BMI) 40.0 and over, adult: Secondary | ICD-10-CM | POA: Diagnosis not present

## 2015-04-27 DIAGNOSIS — J209 Acute bronchitis, unspecified: Secondary | ICD-10-CM | POA: Diagnosis not present

## 2015-04-27 DIAGNOSIS — J019 Acute sinusitis, unspecified: Secondary | ICD-10-CM | POA: Diagnosis not present

## 2015-04-28 ENCOUNTER — Other Ambulatory Visit (HOSPITAL_COMMUNITY): Payer: Self-pay | Admitting: Internal Medicine

## 2015-04-28 DIAGNOSIS — Z13820 Encounter for screening for osteoporosis: Secondary | ICD-10-CM

## 2015-05-07 ENCOUNTER — Other Ambulatory Visit (HOSPITAL_COMMUNITY): Payer: BC Managed Care – PPO

## 2015-06-03 ENCOUNTER — Other Ambulatory Visit (HOSPITAL_COMMUNITY): Payer: BC Managed Care – PPO

## 2015-06-25 DIAGNOSIS — J069 Acute upper respiratory infection, unspecified: Secondary | ICD-10-CM | POA: Diagnosis not present

## 2015-06-25 DIAGNOSIS — Z1389 Encounter for screening for other disorder: Secondary | ICD-10-CM | POA: Diagnosis not present

## 2015-06-25 DIAGNOSIS — Z6841 Body Mass Index (BMI) 40.0 and over, adult: Secondary | ICD-10-CM | POA: Diagnosis not present

## 2015-11-04 DIAGNOSIS — K219 Gastro-esophageal reflux disease without esophagitis: Secondary | ICD-10-CM | POA: Diagnosis not present

## 2015-11-04 DIAGNOSIS — I10 Essential (primary) hypertension: Secondary | ICD-10-CM | POA: Diagnosis not present

## 2015-11-04 DIAGNOSIS — Z6841 Body Mass Index (BMI) 40.0 and over, adult: Secondary | ICD-10-CM | POA: Diagnosis not present

## 2015-11-04 DIAGNOSIS — G894 Chronic pain syndrome: Secondary | ICD-10-CM | POA: Diagnosis not present

## 2015-11-04 DIAGNOSIS — E669 Obesity, unspecified: Secondary | ICD-10-CM | POA: Diagnosis not present

## 2015-11-04 DIAGNOSIS — F419 Anxiety disorder, unspecified: Secondary | ICD-10-CM | POA: Diagnosis not present

## 2015-11-29 DIAGNOSIS — Z23 Encounter for immunization: Secondary | ICD-10-CM | POA: Diagnosis not present

## 2016-05-31 DIAGNOSIS — Z1389 Encounter for screening for other disorder: Secondary | ICD-10-CM | POA: Diagnosis not present

## 2016-05-31 DIAGNOSIS — G43909 Migraine, unspecified, not intractable, without status migrainosus: Secondary | ICD-10-CM | POA: Diagnosis not present

## 2016-05-31 DIAGNOSIS — Z6841 Body Mass Index (BMI) 40.0 and over, adult: Secondary | ICD-10-CM | POA: Diagnosis not present

## 2016-05-31 DIAGNOSIS — J4520 Mild intermittent asthma, uncomplicated: Secondary | ICD-10-CM | POA: Diagnosis not present

## 2016-07-15 DIAGNOSIS — M5412 Radiculopathy, cervical region: Secondary | ICD-10-CM | POA: Diagnosis not present

## 2016-07-15 DIAGNOSIS — E669 Obesity, unspecified: Secondary | ICD-10-CM | POA: Diagnosis not present

## 2016-07-15 DIAGNOSIS — G43909 Migraine, unspecified, not intractable, without status migrainosus: Secondary | ICD-10-CM | POA: Diagnosis not present

## 2016-07-15 DIAGNOSIS — M1991 Primary osteoarthritis, unspecified site: Secondary | ICD-10-CM | POA: Diagnosis not present

## 2016-07-15 DIAGNOSIS — Z1389 Encounter for screening for other disorder: Secondary | ICD-10-CM | POA: Diagnosis not present

## 2016-07-15 DIAGNOSIS — M2550 Pain in unspecified joint: Secondary | ICD-10-CM | POA: Diagnosis not present

## 2016-07-15 DIAGNOSIS — J329 Chronic sinusitis, unspecified: Secondary | ICD-10-CM | POA: Diagnosis not present

## 2016-07-15 DIAGNOSIS — Z6841 Body Mass Index (BMI) 40.0 and over, adult: Secondary | ICD-10-CM | POA: Diagnosis not present

## 2016-12-12 DIAGNOSIS — Z23 Encounter for immunization: Secondary | ICD-10-CM | POA: Diagnosis not present

## 2016-12-31 ENCOUNTER — Inpatient Hospital Stay (HOSPITAL_COMMUNITY)
Admission: EM | Admit: 2016-12-31 | Discharge: 2017-01-02 | DRG: 871 | Disposition: A | Discharge status: Home / Self Care | Payer: Medicare Other | Attending: Internal Medicine | Admitting: Internal Medicine

## 2016-12-31 ENCOUNTER — Emergency Department (HOSPITAL_COMMUNITY): Payer: Medicare Other

## 2016-12-31 ENCOUNTER — Other Ambulatory Visit: Payer: Self-pay

## 2016-12-31 ENCOUNTER — Encounter (HOSPITAL_COMMUNITY): Payer: Self-pay | Admitting: Emergency Medicine

## 2016-12-31 DIAGNOSIS — R4182 Altered mental status, unspecified: Secondary | ICD-10-CM | POA: Diagnosis not present

## 2016-12-31 DIAGNOSIS — Y939 Activity, unspecified: Secondary | ICD-10-CM

## 2016-12-31 DIAGNOSIS — F4489 Other dissociative and conversion disorders: Secondary | ICD-10-CM | POA: Diagnosis not present

## 2016-12-31 DIAGNOSIS — Z961 Presence of intraocular lens: Secondary | ICD-10-CM | POA: Diagnosis present

## 2016-12-31 DIAGNOSIS — A419 Sepsis, unspecified organism: Principal | ICD-10-CM | POA: Diagnosis present

## 2016-12-31 DIAGNOSIS — M069 Rheumatoid arthritis, unspecified: Secondary | ICD-10-CM | POA: Diagnosis present

## 2016-12-31 DIAGNOSIS — Z6841 Body Mass Index (BMI) 40.0 and over, adult: Secondary | ICD-10-CM

## 2016-12-31 DIAGNOSIS — Z9841 Cataract extraction status, right eye: Secondary | ICD-10-CM

## 2016-12-31 DIAGNOSIS — H409 Unspecified glaucoma: Secondary | ICD-10-CM | POA: Diagnosis present

## 2016-12-31 DIAGNOSIS — Z79891 Long term (current) use of opiate analgesic: Secondary | ICD-10-CM

## 2016-12-31 DIAGNOSIS — W5503XA Scratched by cat, initial encounter: Secondary | ICD-10-CM

## 2016-12-31 DIAGNOSIS — F419 Anxiety disorder, unspecified: Secondary | ICD-10-CM | POA: Diagnosis present

## 2016-12-31 DIAGNOSIS — G8929 Other chronic pain: Secondary | ICD-10-CM

## 2016-12-31 DIAGNOSIS — J9811 Atelectasis: Secondary | ICD-10-CM | POA: Diagnosis present

## 2016-12-31 DIAGNOSIS — I119 Hypertensive heart disease without heart failure: Secondary | ICD-10-CM | POA: Diagnosis present

## 2016-12-31 DIAGNOSIS — I6789 Other cerebrovascular disease: Secondary | ICD-10-CM | POA: Diagnosis not present

## 2016-12-31 DIAGNOSIS — L03115 Cellulitis of right lower limb: Secondary | ICD-10-CM | POA: Diagnosis present

## 2016-12-31 DIAGNOSIS — G92 Toxic encephalopathy: Secondary | ICD-10-CM | POA: Diagnosis not present

## 2016-12-31 DIAGNOSIS — E876 Hypokalemia: Secondary | ICD-10-CM | POA: Diagnosis not present

## 2016-12-31 DIAGNOSIS — Y92019 Unspecified place in single-family (private) house as the place of occurrence of the external cause: Secondary | ICD-10-CM

## 2016-12-31 DIAGNOSIS — R404 Transient alteration of awareness: Secondary | ICD-10-CM | POA: Diagnosis not present

## 2016-12-31 DIAGNOSIS — G4733 Obstructive sleep apnea (adult) (pediatric): Secondary | ICD-10-CM | POA: Diagnosis present

## 2016-12-31 DIAGNOSIS — M797 Fibromyalgia: Secondary | ICD-10-CM | POA: Diagnosis present

## 2016-12-31 DIAGNOSIS — I482 Chronic atrial fibrillation: Secondary | ICD-10-CM | POA: Diagnosis not present

## 2016-12-31 DIAGNOSIS — J45909 Unspecified asthma, uncomplicated: Secondary | ICD-10-CM | POA: Diagnosis present

## 2016-12-31 LAB — COMPREHENSIVE METABOLIC PANEL
ALT: 16 U/L (ref 14–54)
AST: 21 U/L (ref 15–41)
Albumin: 4 g/dL (ref 3.5–5.0)
Alkaline Phosphatase: 136 U/L — ABNORMAL HIGH (ref 38–126)
Anion gap: 9 (ref 5–15)
BILIRUBIN TOTAL: 0.8 mg/dL (ref 0.3–1.2)
BUN: 9 mg/dL (ref 6–20)
CHLORIDE: 99 mmol/L — AB (ref 101–111)
CO2: 29 mmol/L (ref 22–32)
Calcium: 8.9 mg/dL (ref 8.9–10.3)
Creatinine, Ser: 0.62 mg/dL (ref 0.44–1.00)
GFR calc non Af Amer: >60 mL/min (ref >60)
Glucose, Bld: 138 mg/dL — ABNORMAL HIGH (ref 65–99)
POTASSIUM: 2.9 mmol/L — AB (ref 3.5–5.1)
Sodium: 137 mmol/L (ref 135–145)
TOTAL PROTEIN: 7.5 g/dL (ref 6.5–8.1)

## 2016-12-31 LAB — CBC WITH DIFFERENTIAL/PLATELET
BASOS ABS: 0 10*3/uL (ref 0.0–0.1)
BASOS PCT: 0 %
EOS PCT: 0 %
Eosinophils Absolute: 0 10*3/uL (ref 0.0–0.7)
HEMATOCRIT: 39.8 % (ref 36.0–46.0)
Hemoglobin: 12.8 g/dL (ref 12.0–15.0)
LYMPHS PCT: 5 %
Lymphs Abs: 0.6 10*3/uL — ABNORMAL LOW (ref 0.7–4.0)
MCH: 28.9 pg (ref 26.0–34.0)
MCHC: 32.2 g/dL (ref 30.0–36.0)
MCV: 89.8 fL (ref 78.0–100.0)
Monocytes Absolute: 0.5 10*3/uL (ref 0.1–1.0)
Monocytes Relative: 4 %
NEUTROS ABS: 10.7 10*3/uL — AB (ref 1.7–7.7)
Neutrophils Relative %: 91 %
PLATELETS: 168 10*3/uL (ref 150–400)
RBC: 4.43 MIL/uL (ref 3.87–5.11)
RDW: 13.4 % (ref 11.5–15.5)
WBC: 11.9 10*3/uL — AB (ref 4.0–10.5)

## 2016-12-31 LAB — URINALYSIS, ROUTINE W REFLEX MICROSCOPIC
Bacteria, UA: NONE SEEN
Bilirubin Urine: NEGATIVE
Glucose, UA: NEGATIVE mg/dL
Hgb urine dipstick: NEGATIVE
Ketones, ur: NEGATIVE mg/dL
Leukocytes, UA: NEGATIVE
Nitrite: NEGATIVE
Protein, ur: 30 mg/dL — AB
SPECIFIC GRAVITY, URINE: 1.013 (ref 1.005–1.030)
pH: 8 (ref 5.0–8.0)

## 2016-12-31 LAB — PROTIME-INR
INR: 0.9
Prothrombin Time: 12.1 seconds (ref 11.4–15.2)

## 2016-12-31 LAB — I-STAT CG4 LACTIC ACID, ED
LACTIC ACID, VENOUS: 2.23 mmol/L — AB (ref 0.5–1.9)
LACTIC ACID, VENOUS: 1.76 mmol/L (ref 0.5–1.9)

## 2016-12-31 MED ORDER — ACETAMINOPHEN 500 MG PO TABS
ORAL_TABLET | ORAL | Status: AC
  Filled 2016-12-31: qty 1

## 2016-12-31 MED ORDER — POTASSIUM CHLORIDE 10 MEQ/100ML IV SOLN
10.0000 meq | Freq: Once | INTRAVENOUS | Status: AC
  Administered 2016-12-31: 10 meq via INTRAVENOUS
  Filled 2016-12-31: qty 100

## 2016-12-31 MED ORDER — DILTIAZEM HCL 60 MG PO TABS
180.0000 mg | ORAL_TABLET | Freq: Every day | ORAL | Status: DC
  Administered 2016-12-31 – 2017-01-02 (×3): 180 mg via ORAL
  Filled 2016-12-31 (×2): qty 6
  Filled 2016-12-31: qty 3

## 2016-12-31 MED ORDER — ONDANSETRON HCL 4 MG/2ML IJ SOLN
4.0000 mg | Freq: Four times a day (QID) | INTRAMUSCULAR | Status: DC | PRN
  Administered 2017-01-02: 4 mg via INTRAVENOUS
  Filled 2016-12-31: qty 2

## 2016-12-31 MED ORDER — SODIUM CHLORIDE 0.9 % IV SOLN
INTRAVENOUS | Status: DC
  Administered 2016-12-31 – 2017-01-02 (×3): via INTRAVENOUS

## 2016-12-31 MED ORDER — SALINE SPRAY 0.65 % NA SOLN
1.0000 | NASAL | Status: DC | PRN
  Administered 2017-01-01: 1 via NASAL
  Filled 2016-12-31: qty 44

## 2016-12-31 MED ORDER — POTASSIUM CHLORIDE 10 MEQ/100ML IV SOLN: 10.0000 meq | INTRAVENOUS | Status: DC

## 2016-12-31 MED ORDER — VANCOMYCIN HCL IN DEXTROSE 1-5 GM/200ML-% IV SOLN
1000.0000 mg | Freq: Once | INTRAVENOUS | Status: DC
Start: 2016-12-31 — End: 2016-12-31
  Filled 2016-12-31: qty 200

## 2016-12-31 MED ORDER — KETOROLAC TROMETHAMINE 30 MG/ML IJ SOLN
30.0000 mg | Freq: Once | INTRAMUSCULAR | Status: AC
  Administered 2016-12-31: 30 mg via INTRAVENOUS
  Filled 2016-12-31: qty 1

## 2016-12-31 MED ORDER — ALBUTEROL SULFATE 2 MG PO TABS
2.0000 mg | ORAL_TABLET | Freq: Three times a day (TID) | ORAL | Status: DC
  Administered 2016-12-31 – 2017-01-02 (×5): 2 mg via ORAL
  Filled 2016-12-31 (×13): qty 1

## 2016-12-31 MED ORDER — ACETAMINOPHEN 650 MG RE SUPP: 650.0000 mg | Freq: Four times a day (QID) | RECTAL | Status: DC | PRN

## 2016-12-31 MED ORDER — ENOXAPARIN SODIUM 40 MG/0.4ML ~~LOC~~ SOLN
40.0000 mg | SUBCUTANEOUS | Status: DC
  Administered 2016-12-31 – 2017-01-01 (×2): 40 mg via SUBCUTANEOUS
  Filled 2016-12-31 (×2): qty 0.4

## 2016-12-31 MED ORDER — PIPERACILLIN-TAZOBACTAM 3.375 G IVPB 30 MIN
3.3750 g | Freq: Once | INTRAVENOUS | Status: AC
Start: 2016-12-31 — End: 2016-12-31
  Administered 2016-12-31: 3.375 g via INTRAVENOUS
  Filled 2016-12-31: qty 50

## 2016-12-31 MED ORDER — ONDANSETRON HCL 4 MG PO TABS: 4.0000 mg | ORAL_TABLET | Freq: Four times a day (QID) | ORAL | Status: DC | PRN

## 2016-12-31 MED ORDER — SODIUM CHLORIDE 0.9 % IV BOLUS (SEPSIS)
1000.0000 mL | Freq: Once | INTRAVENOUS | Status: AC
Start: 2016-12-31 — End: 2016-12-31
  Administered 2016-12-31: 1000 mL via INTRAVENOUS

## 2016-12-31 MED ORDER — METHOCARBAMOL 500 MG PO TABS
500.0000 mg | ORAL_TABLET | Freq: Every day | ORAL | Status: DC
  Administered 2016-12-31 – 2017-01-02 (×3): 500 mg via ORAL
  Filled 2016-12-31 (×3): qty 1

## 2016-12-31 MED ORDER — TETRAHYDROZOLINE HCL 0.05 % OP SOLN
1.0000 [drp] | Freq: Every morning | OPHTHALMIC | Status: DC
  Administered 2017-01-01 – 2017-01-02 (×2): 1 [drp] via OPHTHALMIC
  Filled 2016-12-31: qty 15

## 2016-12-31 MED ORDER — SODIUM CHLORIDE 0.9 % IV BOLUS (SEPSIS)
1000.0000 mL | Freq: Once | INTRAVENOUS | Status: AC
  Administered 2016-12-31: 1000 mL via INTRAVENOUS

## 2016-12-31 MED ORDER — VANCOMYCIN HCL 10 G IV SOLR
1250.0000 mg | INTRAVENOUS | Status: DC
  Administered 2017-01-01: 1250 mg via INTRAVENOUS
  Filled 2016-12-31 (×2): qty 1250

## 2016-12-31 MED ORDER — PIPERACILLIN-TAZOBACTAM 3.375 G IVPB
3.3750 g | Freq: Three times a day (TID) | INTRAVENOUS | Status: DC
  Administered 2016-12-31 – 2017-01-02 (×6): 3.375 g via INTRAVENOUS
  Filled 2016-12-31 (×6): qty 50

## 2016-12-31 MED ORDER — ACETAMINOPHEN 500 MG PO TABS
1000.0000 mg | ORAL_TABLET | Freq: Once | ORAL | Status: AC
  Administered 2016-12-31: 1000 mg via ORAL

## 2016-12-31 MED ORDER — VANCOMYCIN HCL IN DEXTROSE 1-5 GM/200ML-% IV SOLN
1000.0000 mg | INTRAVENOUS | Status: AC
  Administered 2016-12-31 (×2): 1000 mg via INTRAVENOUS
  Filled 2016-12-31: qty 200

## 2016-12-31 MED ORDER — BUPRENORPHINE HCL-NALOXONE HCL 8-2 MG SL SUBL
1.0000 | SUBLINGUAL_TABLET | Freq: Three times a day (TID) | SUBLINGUAL | Status: DC
  Administered 2016-12-31 – 2017-01-02 (×6): 1 via SUBLINGUAL
  Filled 2016-12-31 (×6): qty 1

## 2016-12-31 MED ORDER — ALPRAZOLAM 0.5 MG PO TABS: 0.5000 mg | ORAL_TABLET | Freq: Four times a day (QID) | ORAL | Status: DC | PRN

## 2016-12-31 MED ORDER — ACETAMINOPHEN 650 MG RE SUPP
RECTAL | Status: AC
Start: 2016-12-31 — End: 2016-12-31
  Administered 2016-12-31: 650 mg
  Filled 2016-12-31: qty 1

## 2016-12-31 MED ORDER — ACETAMINOPHEN 325 MG PO TABS
650.0000 mg | ORAL_TABLET | Freq: Four times a day (QID) | ORAL | Status: DC | PRN
  Administered 2016-12-31 – 2017-01-01 (×3): 650 mg via ORAL
  Filled 2016-12-31 (×3): qty 2

## 2016-12-31 MED ORDER — POTASSIUM CHLORIDE 10 MEQ/100ML IV SOLN
10.0000 meq | INTRAVENOUS | Status: AC
  Administered 2016-12-31 (×3): 10 meq via INTRAVENOUS
  Filled 2016-12-31 (×3): qty 100

## 2016-12-31 NOTE — ED Notes (Signed)
Pt awake and alert talking on phone

## 2016-12-31 NOTE — Plan of Care (Signed)
  Skin Integrity: Risk for impaired skin integrity will decrease 12/31/2016 2359 - Progressing by Pamala Hurry, RN  Patient receiving antibiotic for cellulitis. Will continue to monitor RLE

## 2016-12-31 NOTE — H&P (Signed)
[facility] H&P    Patient Demographics:    [name] [name], is a 66 y.o. female  MRN: [id number]  DOB - [birth year]  Admit Date - 12/31/2016  Referring MD/NP/PA: Dr. Gustavus Messing  Outpatient Primary MD for the patient is [name], MD  Patient coming from: Home  Chief Complaint  Patient presents with  . Altered Mental Status      HPI:    [name] [name]  is a 66 y.o. female,.. With history of asthma, fibromyalgia, hypertension, glaucoma was brought to hospital for altered mental status.  As per family patient was bit by her cat on the right leg 2 days ago and yesterday she has been complaining of right lower extremity pain.  This morning patient was confused and was mumbling not able to answer questions appropriately.  She was brought to ED and was found to have temperature 105.  Lactic acid was elevated 2.23.  Patient was started on sepsis protocol and started on vancomycin and Zosyn per pharmacy. She was given IV fluid bolus, though her blood pressure remained elevated in the hospital. Patient's mental status has significantly improved and she is back to her baseline. She is answering all questions appropriately at this time. UA is clear, chest x-ray shows no pneumonia.  She denies chest pain or shortness of breath. No nausea, vomiting or diarrhea. She does complain of right lower extremity pain She denies dysuria urgency or frequency of urination. She denies abdominal pain No previous history of stroke or seizures. Denies history of diabetes mellitus    Review of systems:      All other systems reviewed and are negative.   With Past History of the following :    Past Medical History:  Diagnosis Date  . Anxiety   . Asthma   . Ectopic pregnancy   . Fibromyalgia   . Glaucoma   . Hypertension   . OSA (obstructive sleep apnea)    o2 at 2.5 liters at night  . Rheumatoid arthritis(714.0)        Past Surgical History:  Procedure Laterality Date  . BACK SURGERY    . CATARACT EXTRACTION    . CATARACT EXTRACTION PHACO AND INTRAOCULAR LENS PLACEMENT (IOC) Right 01/16/2012   Performed by Susa Simmonds, MD at [facility]  . CERVICAL LAMINECTOMY     with plating  . CHOLECYSTECTOMY    . ECTOPIC PREGNANCY SURGERY    . KNEE ARTHROSCOPY    . TUBAL LIGATION    . vericose vein surgery        Social History:      Social History   Tobacco Use  . Smoking status: Never Smoker  Substance Use Topics  . Alcohol use: No       Family History :   Not pertinent   Home Medications:   Prior to Admission medications   Medication Sig Start Date End Date Taking? Authorizing Provider  albuterol (PROVENTIL) 2 MG tablet Take 2 mg by mouth 3 (three) times daily.   Yes [provider]  ALPRAZolam Prudy Feeler) 0.5  MG tablet Take 0.5 mg 4 (four) times daily as needed by mouth for anxiety. 12/29/16  Yes [provider]  buprenorphine-naloxone (SUBOXONE) 8-2 MG SUBL Place 1 tablet under the tongue 4 (four) times daily as needed. Pain.   Yes [provider]  diltiazem (CARDIZEM) 120 MG tablet Take 180 mg by mouth daily.   Yes [provider]  fexofenadine (ALLEGRA) 180 MG tablet Take 180 mg by mouth daily.   Yes [provider]  methocarbamol (ROBAXIN) 500 MG tablet Take 500 mg daily by mouth. 12/04/16  Yes [provider]  Tetrahydrozoline HCl (VISINE OP) Apply 1 drop to eye every morning.   Yes [provider]  azithromycin (ZITHROMAX) 250 MG tablet Take 2 tablets by mouth on day one followed by one tablet daily for 4 days. Patient not taking: Reported on 12/31/2016 01/08/13   Burgess Amor, PA-C  benzonatate (TESSALON) 200 MG capsule Take 1 capsule (200 mg total) by mouth 3 (three) times daily as needed for cough. Patient not taking: Reported on 12/31/2016 01/08/13   Burgess Amor, PA-C  butalbital-acetaminophen-caffeine (FIORICET, ESGIC)  50-325-40 MG per tablet Take 1 tablet by mouth 4 (four) times daily.    [provider]  Menthol-Methyl Salicylate (GNP MUSCLE RUB EX) Apply 1 application topically daily as needed. Pain.    [provider]  predniSONE (DELTASONE) 10 MG tablet 6, 5, 4, 3, 2 then 1 tablet by mouth daily for 6 days total. Patient not taking: Reported on 12/31/2016 01/08/13   Burgess Amor, PA-C     Allergies:    No Known Allergies   Physical Exam:   Vitals  Blood pressure 116/65, pulse (!) 104, temperature (!) 102.7 F (39.3 C), resp. rate 17, height 5\' 5"  (1.651 m), weight 98.9 kg (218 lb), SpO2 98 %.  1.  General: Appears in no acute distress  2. Psychiatric:  Intact judgement and  insight, awake alert, oriented x 3.  3. Neurologic: No focal neurological deficits, all cranial nerves intact.Strength 5/5 all 4 extremities, sensation intact all 4 extremities, plantars down going.  4. Eyes :  anicteric sclerae, moist conjunctivae with no lid lag. PERRLA.  5. ENMT:  Oropharynx clear with moist mucous membranes and good dentition  6. Neck:  supple, no cervical lymphadenopathy appriciated, No thyromegaly  7. Respiratory : Normal respiratory effort, good air movement bilaterally,clear to  auscultation bilaterally  8. Cardiovascular : RRR, no gallops, rubs or murmurs, no leg edema  9. Gastrointestinal:  Positive bowel sounds, abdomen soft, non-tender to palpation,no hepatosplenomegaly, no rigidity or guarding       10. Skin:  Right lower extremity is erythematous, warm to touch.  Tender to palpation  11.Musculoskeletal:  Good muscle tone,  joints appear normal , no effusions,  normal range of motion    Data Review:    CBC Recent Labs  Lab 12/31/16 1051  WBC 11.9*  HGB 12.8  HCT 39.8  PLT 168  MCV 89.8  MCH 28.9  MCHC 32.2  RDW 13.4  LYMPHSABS 0.6*  MONOABS 0.5  EOSABS 0.0  BASOSABS 0.0    ------------------------------------------------------------------------------------------------------------------  Chemistries  Recent Labs  Lab 12/31/16 1051  NA 137  K 2.9*  CL 99*  CO2 29  GLUCOSE 138*  BUN 9  CREATININE 0.62  CALCIUM 8.9  AST 21  ALT 16  ALKPHOS 136*  BILITOT 0.8   ------------------------------------------------------------------------------------------------------------------  ------------------------------------------------------------------------------------------------------------------ GFR: Estimated Creatinine Clearance: 79.5 mL/min (by C-G formula based on SCr of 0.62 mg/dL). Liver  Function Tests: Recent Labs  Lab 12/31/16 1051  AST 21  ALT 16  ALKPHOS 136*  BILITOT 0.8  PROT 7.5  ALBUMIN 4.0   No results for input(s): LIPASE, AMYLASE in the last 168 hours. No results for input(s): AMMONIA in the last 168 hours. Coagulation Profile: Recent Labs  Lab 12/31/16 1051  INR 0.90    --------------------------------------------------------------------------------------------------------------- Urine analysis:    Component Value Date/Time   COLORURINE YELLOW 12/31/2016 1051   APPEARANCEUR CLEAR 12/31/2016 1051   LABSPEC 1.013 12/31/2016 1051   PHURINE 8.0 12/31/2016 1051   GLUCOSEU NEGATIVE 12/31/2016 1051   HGBUR NEGATIVE 12/31/2016 1051   BILIRUBINUR NEGATIVE 12/31/2016 1051   KETONESUR NEGATIVE 12/31/2016 1051   PROTEINUR 30 (A) 12/31/2016 1051   UROBILINOGEN 0.2 06/11/2010 1149   NITRITE NEGATIVE 12/31/2016 1051   LEUKOCYTESUR NEGATIVE 12/31/2016 1051      Imaging Results:    Ct Head Wo Contrast  Result Date: 12/31/2016 CLINICAL DATA:  Altered mental status. EXAM: CT HEAD WITHOUT CONTRAST TECHNIQUE: Contiguous axial images were obtained from the base of the skull through the vertex without intravenous contrast. COMPARISON:  CT sinuses dated August 15, 2008. FINDINGS: Brain: No evidence of acute infarction, hemorrhage,  hydrocephalus, extra-axial collection or mass lesion/mass effect. Vascular: No hyperdense vessel or unexpected calcification. Skull: Normal. Negative for fracture or focal lesion. Sinuses/Orbits: No acute finding. Other: None. IMPRESSION: 1.  No acute intracranial abnormality. Electronically Signed   By: Obie Dredge M.D.   On: 12/31/2016 15:27   Dg Chest Port 1 View  Result Date: 12/31/2016 CLINICAL DATA:  Code sepsis EXAM: PORTABLE CHEST 1 VIEW COMPARISON:  01/08/2013 FINDINGS: Cardiomegaly with vascular congestion. Bibasilar atelectasis. Mild interstitial prominence could reflect early interstitial edema. No effusions or acute bony abnormality. IMPRESSION: Cardiomegaly, question mild interstitial edema. Bibasilar atelectasis. Electronically Signed   By: Charlett Nose M.D.   On: 12/31/2016 11:51      Assessment & Plan:    Active Problems:   Sepsis (HCC)   1. Sepsis due to right lower extremity cellulitis-secondary to cat bite, continue vancomycin and Zosyn.  Blood cultures x2 have been obtained.  Once patient's condition improves she can be switched to oral antibiotics and discharged home. 2. Hypokalemia-potassium is 2.9, she received 1 dose of IV KCl 10 mEq in the ED.  We will start 10 mg IV KCl x3.  Follow BMP in a.m. 3. History of opioid addiction-continue Suboxone 4. Hypertension-blood pressure is stable, continue Cardizem 5. Anxiety-continue Xanax as needed   DVT Prophylaxis-   Lovenox  AM Labs Ordered, also please review Full Orders  Family Communication: Admission, patients condition and plan of care including tests being ordered have been discussed with the patient and her husband and son at bedside who indicate understanding and agree with the plan and Code Status.  Code Status: Full code   Admission status:  observation   Time spent in minutes : 60 minutes   Meredeth Ide M.D on 12/31/2016 at 3:47 PM  Between 7am to 7pm - Pager - [phone]. After 7pm go to  [email or web address] - password [facility]  [facility] - Office  [phone]

## 2016-12-31 NOTE — Progress Notes (Signed)
Pharmacy Antibiotic Note  [name] [name] is a 66 y.o. female admitted on 12/31/2016 with sepsis/ cellulitis.  Pharmacy has been consulted for Vancomycin and zosyn dosing.  Plan: Vancomycin 2000mg  loading dose, then 1250mg  IV every 12 hours.  Goal trough 15-20 mcg/mL.  Zosyn 3.375g IV q8h, EID over 4 hours F/U cxs and clinical progress Monitor V/S, labs and levels as indicated  Height: 5\' 5"  (165.1 cm) Weight: 218 lb (98.9 kg) IBW/kg (Calculated) : 57  Temp (24hrs), Avg:103.6 F (39.8 C), Min:101.7 F (38.7 C), Max:105.8 F (41 C)  Recent Labs  Lab 12/31/16 1051 12/31/16 1107 12/31/16 1602  WBC 11.9*  --   --   CREATININE 0.62  --   --   LATICACIDVEN  --  2.23* 1.76    Estimated Creatinine Clearance: 79.5 mL/min (by C-G formula based on SCr of 0.62 mg/dL).    No Known Allergies  Antimicrobials this admission: Vancomycin 11/17 >>  Zosyn 11/17 >>   Dose adjustments this admission: N/A  Microbiology results: 11/17 BCx: pending 11/17 UCx: pending  Thank you for allowing pharmacy to be a part of this patient's care.  12/17, BS 12/17, 12/17 Clinical Pharmacist Pager [phone] 12/31/2016 5:24 PM

## 2016-12-31 NOTE — ED Notes (Signed)
Pt is more awake and alert than earlier, answering questions appropriately.

## 2016-12-31 NOTE — ED Triage Notes (Signed)
Pt's husband called ems for pt being altered since last night.  Pt states her name, but otherwise incomprehensible speech.

## 2016-12-31 NOTE — ED Provider Notes (Addendum)
[facility] EMERGENCY DEPARTMENT Provider Note   CSN: [id number] Arrival date & time: 12/31/16  1035     History   Chief Complaint Chief Complaint  Patient presents with  . Altered Mental Status    HPI [name] [name] is a 66 y.o. female.  Patient brought in by EMS.  Patient husband called EMS for patient having altered mental status started last night was worse today.  Patient able to state her name but otherwise speech is incomprehensible.  Patient noted to have significant fever and tachycardia upon arrival.  Temp was 105.8 heart rate was 123.  Respiratory rate was 42.  Oxygen saturations were 93%.  Patient apparently was fine earlier in the day.  Patient met sepsis criteria upon arrival.  Sepsis protocol ordered.      Past Medical History:  Diagnosis Date  . Anxiety   . Asthma   . Ectopic pregnancy   . Fibromyalgia   . Glaucoma   . Hypertension   . OSA (obstructive sleep apnea)    o2 at 2.5 liters at night  . Rheumatoid arthritis(714.0)     There are no active problems to display for this patient.   Past Surgical History:  Procedure Laterality Date  . BACK SURGERY    . CATARACT EXTRACTION    . CATARACT EXTRACTION PHACO AND INTRAOCULAR LENS PLACEMENT ([place]) Right 01/16/2012   Performed by Williams Che, MD at [facility]  . CERVICAL LAMINECTOMY     with plating  . CHOLECYSTECTOMY    . ECTOPIC PREGNANCY SURGERY    . KNEE ARTHROSCOPY    . TUBAL LIGATION    . vericose vein surgery      OB History    No data available       Home Medications    Prior to Admission medications   Medication Sig Start Date End Date Taking? Authorizing Provider  albuterol (PROVENTIL) 2 MG tablet Take 2 mg by mouth 3 (three) times daily.   Yes [provider]  ALPRAZolam Duanne Moron) 0.5 MG tablet Take 0.5 mg 4 (four) times daily as needed by mouth for anxiety. 12/29/16  Yes [provider]  buprenorphine-naloxone (SUBOXONE) 8-2 MG SUBL Place 1 tablet under the  tongue 4 (four) times daily as needed. Pain.   Yes [provider]  diltiazem (CARDIZEM) 120 MG tablet Take 180 mg by mouth daily.   Yes [provider]  fexofenadine (ALLEGRA) 180 MG tablet Take 180 mg by mouth daily.   Yes [provider]  methocarbamol (ROBAXIN) 500 MG tablet Take 500 mg daily by mouth. 12/04/16  Yes [provider]  Tetrahydrozoline HCl (VISINE OP) Apply 1 drop to eye every morning.   Yes [provider]  azithromycin (ZITHROMAX) 250 MG tablet Take 2 tablets by mouth on day one followed by one tablet daily for 4 days. Patient not taking: Reported on 12/31/2016 01/08/13   Evalee Jefferson, PA-C  benzonatate (TESSALON) 200 MG capsule Take 1 capsule (200 mg total) by mouth 3 (three) times daily as needed for cough. Patient not taking: Reported on 12/31/2016 01/08/13   Evalee Jefferson, PA-C  butalbital-acetaminophen-caffeine (FIORICET, ESGIC) 50-325-40 MG per tablet Take 1 tablet by mouth 4 (four) times daily.    [provider]  Menthol-Methyl Salicylate (GNP MUSCLE RUB EX) Apply 1 application topically daily as needed. Pain.    [provider]  predniSONE (DELTASONE) 10 MG tablet 6, 5, 4, 3, 2 then 1 tablet by mouth daily for 6 days  total. Patient not taking: Reported on 12/31/2016 01/08/13   Evalee Jefferson, PA-C    Family History History reviewed. No pertinent family history.  Social History Social History   Tobacco Use  . Smoking status: Never Smoker  Substance Use Topics  . Alcohol use: No  . Drug use: No     Allergies   Patient has no known allergies.   Review of Systems Review of Systems  Unable to perform ROS: Mental status change     Physical Exam Updated Vital Signs BP 125/70   Pulse (!) 112   Temp (!) 103.3 F (39.6 C)   Resp (!) 25   Ht 1.651 m ('5\' 5"'$ )   Wt 98.9 kg (218 lb)   SpO2 91%   BMI 36.28 kg/m   Physical Exam  Constitutional: She appears well-developed and well-nourished. She  appears distressed.  HENT:  Head: Normocephalic and atraumatic.  Patient would not open mouth to assess mucous membranes but they appear dry.  Eyes: Conjunctivae are normal. Pupils are equal, round, and reactive to light.  Neck: Neck supple.  Cardiovascular: Regular rhythm and normal heart sounds.  Tachycardic  Pulmonary/Chest: Effort normal and breath sounds normal. No respiratory distress.  Abdominal: Soft. Bowel sounds are normal. There is no tenderness.  Musculoskeletal: Normal range of motion. She exhibits edema.  Neurological:  Patient with altered mental status.  Is awake minimal verbalization will not follow commands well.  Skin: Skin is warm.  Skin very hot to touch.  Nursing note and vitals reviewed.    ED Treatments / Results  Labs (all labs ordered are listed, but only abnormal results are displayed) Labs Reviewed  COMPREHENSIVE METABOLIC PANEL - Abnormal; Notable for the following components:      Result Value   Potassium 2.9 (*)    Chloride 99 (*)    Glucose, Bld 138 (*)    Alkaline Phosphatase 136 (*)    All other components within normal limits  CBC WITH DIFFERENTIAL/PLATELET - Abnormal; Notable for the following components:   WBC 11.9 (*)    Neutro Abs 10.7 (*)    Lymphs Abs 0.6 (*)    All other components within normal limits  URINALYSIS, ROUTINE W REFLEX MICROSCOPIC - Abnormal; Notable for the following components:   Protein, ur 30 (*)    Squamous Epithelial / LPF 0-5 (*)    All other components within normal limits  I-STAT CG4 LACTIC ACID, ED - Abnormal; Notable for the following components:   Lactic Acid, Venous 2.23 (*)    All other components within normal limits  CULTURE, BLOOD (ROUTINE X 2)  CULTURE, BLOOD (ROUTINE X 2)  URINE CULTURE  PROTIME-INR  I-STAT CG4 LACTIC ACID, ED  I-STAT CG4 LACTIC ACID, ED  I-STAT CG4 LACTIC ACID, ED    EKG  EKG Interpretation None       Radiology Dg Chest Port 1 View  Result Date: 12/31/2016 CLINICAL  DATA:  Code sepsis EXAM: PORTABLE CHEST 1 VIEW COMPARISON:  01/08/2013 FINDINGS: Cardiomegaly with vascular congestion. Bibasilar atelectasis. Mild interstitial prominence could reflect early interstitial edema. No effusions or acute bony abnormality. IMPRESSION: Cardiomegaly, question mild interstitial edema. Bibasilar atelectasis. Electronically Signed   By: Rolm Baptise M.D.   On: 12/31/2016 11:51    Procedures Procedures (including critical care time)  CRITICAL CARE Performed by: Fredia Sorrow Total critical care time: 60 minutes Critical care time was exclusive of separately billable procedures and treating other patients. Critical care was necessary to treat or prevent imminent  or life-threatening deterioration. Critical care was time spent personally by me on the following activities: development of treatment plan with patient and/or surrogate as well as nursing, discussions with consultants, evaluation of patient's response to treatment, examination of patient, obtaining history from patient or surrogate, ordering and performing treatments and interventions, ordering and review of laboratory studies, ordering and review of radiographic studies, pulse oximetry and re-evaluation of patient's condition.   Medications Ordered in ED Medications  acetaminophen (TYLENOL) 650 MG suppository (650 mg  Given 12/31/16 1118)  sodium chloride 0.9 % bolus 1,000 mL (0 mLs Intravenous Stopped 12/31/16 1304)    And  sodium chloride 0.9 % bolus 1,000 mL (0 mLs Intravenous Stopped 12/31/16 1304)    And  sodium chloride 0.9 % bolus 1,000 mL (0 mLs Intravenous Stopped 12/31/16 1442)  piperacillin-tazobactam (ZOSYN) IVPB 3.375 g (0 g Intravenous Stopped 12/31/16 1158)  vancomycin (VANCOCIN) IVPB 1000 mg/200 mL premix (0 mg Intravenous Stopped 12/31/16 1441)  potassium chloride 10 mEq in 100 mL IVPB (10 mEq Intravenous New Bag/Given 12/31/16 1230)  potassium chloride 10 mEq in 100 mL IVPB (0 mEq  Intravenous Stopped 12/31/16 1442)  ketorolac (TORADOL) 30 MG/ML injection 30 mg (30 mg Intravenous Given 12/31/16 1335)  acetaminophen (TYLENOL) tablet 1,000 mg (1,000 mg Oral Given 12/31/16 1450)     Initial Impression / Assessment and Plan / ED Course  I have reviewed the triage vital signs and the nursing notes.  Pertinent labs & imaging results that were available during my care of the patient were reviewed by me and considered in my medical decision making (see chart for details).     Sepsis protocol was ordered broad-spectrum antibiotics received.  Patient put on cooling blanket.  For the hyperthermia.  Temperature slowly came down but does not come back down to normal.  With antibiotics and IV hydration.  Patient received 30/kg full fluid challenge.  Patient's mental status started to improve.  Head CT is pending but patient's mental status improved significantly she will now follow commands.  She is much more animated and alert.  Workup for the source of the infection is not clear.  Lactic acid was elevated at 2.23.  Potassium was low at 2.9.  Patient received 10 mCi of potassium IV x2 for this.  White blood cell count was elevated at 11.9.  No other significant lab abnormalities.  Chest x-ray negative for pneumonia.  Head CT pending as stated above.  Urinalysis negative for urinary tract infection.  Exact source of the fever is not clear.  Patient did receive a cat scratch to her right leg in the past few days.  Possible source.  Patient never hypotensive.  Tachycardia persists but is improved.  Fever somewhat improved but not resolved.  Final Clinical Impressions(s) / ED Diagnoses   Final diagnoses:  Sepsis, due to unspecified organism [facility])  Hypokalemia    ED Discharge Orders    None       Fredia Sorrow, MD 12/31/16 1517    Fredia Sorrow, MD 12/31/16 1518  Addendum:  Patient now mental status is almost back to normal patient talking in sentences.  Patient  following all commands.  Patient skin is also less red he can now appreciate the area where we thought there was a cat scratch on the back of her right leg.  There is now erythema to the front part of the leg and some to the back.  And also family member stated it must of been a bite because  the cat does not have its front claws.  So they are assuming the cat bit her.  This visit a potential source of the infection.  But patient doing much much better.    Fredia Sorrow, MD 12/31/16 [phone]

## 2017-01-01 ENCOUNTER — Encounter (HOSPITAL_COMMUNITY): Payer: Self-pay | Admitting: Emergency Medicine

## 2017-01-01 DIAGNOSIS — Z79891 Long term (current) use of opiate analgesic: Secondary | ICD-10-CM | POA: Diagnosis not present

## 2017-01-01 DIAGNOSIS — J45909 Unspecified asthma, uncomplicated: Secondary | ICD-10-CM | POA: Diagnosis not present

## 2017-01-01 DIAGNOSIS — Z961 Presence of intraocular lens: Secondary | ICD-10-CM | POA: Diagnosis present

## 2017-01-01 DIAGNOSIS — I119 Hypertensive heart disease without heart failure: Secondary | ICD-10-CM | POA: Diagnosis present

## 2017-01-01 DIAGNOSIS — F419 Anxiety disorder, unspecified: Secondary | ICD-10-CM | POA: Diagnosis present

## 2017-01-01 DIAGNOSIS — W5503XA Scratched by cat, initial encounter: Secondary | ICD-10-CM | POA: Diagnosis not present

## 2017-01-01 DIAGNOSIS — M069 Rheumatoid arthritis, unspecified: Secondary | ICD-10-CM | POA: Diagnosis present

## 2017-01-01 DIAGNOSIS — Z9841 Cataract extraction status, right eye: Secondary | ICD-10-CM | POA: Diagnosis not present

## 2017-01-01 DIAGNOSIS — Y92019 Unspecified place in single-family (private) house as the place of occurrence of the external cause: Secondary | ICD-10-CM | POA: Diagnosis not present

## 2017-01-01 DIAGNOSIS — L03115 Cellulitis of right lower limb: Secondary | ICD-10-CM

## 2017-01-01 DIAGNOSIS — G8929 Other chronic pain: Secondary | ICD-10-CM

## 2017-01-01 DIAGNOSIS — E876 Hypokalemia: Secondary | ICD-10-CM | POA: Diagnosis not present

## 2017-01-01 DIAGNOSIS — J9811 Atelectasis: Secondary | ICD-10-CM | POA: Diagnosis present

## 2017-01-01 DIAGNOSIS — Y939 Activity, unspecified: Secondary | ICD-10-CM | POA: Diagnosis not present

## 2017-01-01 DIAGNOSIS — G92 Toxic encephalopathy: Secondary | ICD-10-CM | POA: Diagnosis present

## 2017-01-01 DIAGNOSIS — M797 Fibromyalgia: Secondary | ICD-10-CM | POA: Diagnosis not present

## 2017-01-01 DIAGNOSIS — A419 Sepsis, unspecified organism: Secondary | ICD-10-CM | POA: Diagnosis not present

## 2017-01-01 DIAGNOSIS — G4733 Obstructive sleep apnea (adult) (pediatric): Secondary | ICD-10-CM | POA: Diagnosis present

## 2017-01-01 DIAGNOSIS — G894 Chronic pain syndrome: Secondary | ICD-10-CM | POA: Diagnosis not present

## 2017-01-01 DIAGNOSIS — Z6841 Body Mass Index (BMI) 40.0 and over, adult: Secondary | ICD-10-CM | POA: Diagnosis not present

## 2017-01-01 DIAGNOSIS — H409 Unspecified glaucoma: Secondary | ICD-10-CM | POA: Diagnosis present

## 2017-01-01 LAB — COMPREHENSIVE METABOLIC PANEL
ALBUMIN: 3.1 g/dL — AB (ref 3.5–5.0)
ALK PHOS: 116 U/L (ref 38–126)
ALT: 15 U/L (ref 14–54)
AST: 23 U/L (ref 15–41)
Anion gap: 7 (ref 5–15)
BILIRUBIN TOTAL: 0.7 mg/dL (ref 0.3–1.2)
BUN: 13 mg/dL (ref 6–20)
CALCIUM: 8.1 mg/dL — AB (ref 8.9–10.3)
CO2: 29 mmol/L (ref 22–32)
CREATININE: 0.6 mg/dL (ref 0.44–1.00)
Chloride: 105 mmol/L (ref 101–111)
GFR calc Af Amer: >60 mL/min (ref >60)
GLUCOSE: 123 mg/dL — AB (ref 65–99)
POTASSIUM: 3.5 mmol/L (ref 3.5–5.1)
Sodium: 141 mmol/L (ref 135–145)
TOTAL PROTEIN: 6.3 g/dL — AB (ref 6.5–8.1)

## 2017-01-01 LAB — CBC
HEMATOCRIT: 34.5 % — AB (ref 36.0–46.0)
HEMOGLOBIN: 10.9 g/dL — AB (ref 12.0–15.0)
MCH: 28.5 pg (ref 26.0–34.0)
MCHC: 31.6 g/dL (ref 30.0–36.0)
MCV: 90.3 fL (ref 78.0–100.0)
Platelets: 153 10*3/uL (ref 150–400)
RBC: 3.82 MIL/uL — AB (ref 3.87–5.11)
RDW: 13.6 % (ref 11.5–15.5)
WBC: 14.4 10*3/uL — AB (ref 4.0–10.5)

## 2017-01-01 LAB — MRSA PCR SCREENING: MRSA BY PCR: NEGATIVE

## 2017-01-01 MED ORDER — BUTALBITAL-APAP-CAFFEINE 50-325-40 MG PO TABS
1.0000 | ORAL_TABLET | ORAL | Status: DC | PRN
  Administered 2017-01-01 – 2017-01-02 (×2): 1 via ORAL
  Filled 2017-01-01 (×2): qty 1

## 2017-01-01 MED ORDER — POTASSIUM CHLORIDE CRYS ER 20 MEQ PO TBCR
40.0000 meq | EXTENDED_RELEASE_TABLET | Freq: Once | ORAL | Status: AC
  Administered 2017-01-01: 40 meq via ORAL
  Filled 2017-01-01: qty 2

## 2017-01-01 MED ORDER — IBUPROFEN 400 MG PO TABS: 400.0000 mg | ORAL_TABLET | ORAL | Status: DC | PRN

## 2017-01-01 MED ORDER — LORATADINE 10 MG PO TABS
10.0000 mg | ORAL_TABLET | Freq: Every day | ORAL | Status: DC
  Administered 2017-01-01 – 2017-01-02 (×2): 10 mg via ORAL
  Filled 2017-01-01 (×2): qty 1

## 2017-01-01 NOTE — Progress Notes (Addendum)
PROGRESS NOTE    ANALYAH STUTTS   TOI:[id number]  DOB: 1950-02-22  DOA: 12/31/2016 PCP: [name], MD   Brief Narrative:  Blanch Media 66 y/o F with asthma, fibromyalgia, HTN, glaucoma on chronic narcotics for pain who was accidentally scracted by her cat a couple of days ago and comes in for redness and swelling of the right leg. She was found to be confused with a temp of 105 at home. Lactic acid was 2.23 in the ER and she was admitted for cellulitis. She was given Vanc and Zosyn for sepsis and by the time she was seen by the admitting doctor, her mental status had improved to normal.    Subjective: No complaints to me today other than feeling she was stubborn to wait too long to come to the hospital. Leg appears to be unchanged.  ROS: no complaints of nausea, vomiting, constipation diarrhea, cough, dyspnea or dysuria. No other complaints.   Assessment & Plan:   Principal Problem:   Cellulitis of right leg/ severe sepsis   - cont Zosyn-  - running a constant fever today- will place on PRN Ibuprofen - WBC up but Lactic acid improved - blood cultures pending  Active Problems:  Acute encephalopathy - due to fever and sepsis- better today  Hypokalemia - replacing     Chronic pain/   Fibromyalgia - she states she is not an addict- she was previously placed on narcotics for fibromyalgia with symptoms of diffuse pain and became dependant (per patient, she does not always use Suboxone QID but uses it PRN - cont Robaxin daily - uses Fioricet only for headaches    Asthma - stable - cont home meds> albuterol tabs and allegra  anxiety - cont Xanax   DVT prophylaxis: Lovenox Code Status: Full code Family Communication:  Disposition Plan: transfer to med/surg Consultants:   none Procedures:   none Antimicrobials:  Anti-infectives (From admission, onward)   Start     Dose/Rate Route Frequency Ordered Stop   12/31/16 2300  vancomycin (VANCOCIN) 1,250 mg in  sodium chloride 0.9 % 250 mL IVPB  Status:  Discontinued     1,250 mg 166.7 mL/hr over 90 Minutes Intravenous Every 24 hours 12/31/16 1728 01/01/17 0820   12/31/16 1800  piperacillin-tazobactam (ZOSYN) IVPB 3.375 g     3.375 g 12.5 mL/hr over 240 Minutes Intravenous Every 8 hours 12/31/16 1728     12/31/16 1145  vancomycin (VANCOCIN) IVPB 1000 mg/200 mL premix     1,000 mg 200 mL/hr over 60 Minutes Intravenous Every 1 hr x 2 12/31/16 1135 12/31/16 1441   12/31/16 1130  piperacillin-tazobactam (ZOSYN) IVPB 3.375 g     3.375 g 100 mL/hr over 30 Minutes Intravenous  Once 12/31/16 1126 12/31/16 1158   12/31/16 1130  vancomycin (VANCOCIN) IVPB 1000 mg/200 mL premix  Status:  Discontinued     1,000 mg 200 mL/hr over 60 Minutes Intravenous  Once 12/31/16 1126 12/31/16 1135       Objective: Vitals:   01/01/17 1100 01/01/17 1200 01/01/17 1300 01/01/17 1400  BP: 115/71 103/75 112/69 112/70  Pulse: 90 88 80 79  Resp: (!) 22 18 17 19   Temp: (!) 101 F (38.3 C) (!) 101.2 F (38.4 C) (!) 100.9 F (38.3 C) (!) 100.7 F (38.2 C)  TempSrc:      SpO2: 90% 95% 94% 95%  Weight:      Height:        Intake/Output Summary (Last 24 hours) at  01/01/2017 1428 Last data filed at 01/01/2017 1426 Gross per 24 hour  Intake 2900 ml  Output 4900 ml  Net -2000 ml   Filed Weights   12/31/16 1048  Weight: 98.9 kg (218 lb)    Examination: General exam: Appears comfortable  HEENT: PERRLA, oral mucosa moist, no sclera icterus or thrush Respiratory system: Clear to auscultation. Respiratory effort normal. Cardiovascular system: S1 & S2 heard, RRR.  No murmurs  Gastrointestinal system: Abdomen soft, non-tender, nondistended. Normal bowel sound. No organomegaly Central nervous system: Alert and oriented. No focal neurological deficits. Extremities: No cyanosis, clubbing or edema Skin: erythema, warmth and tenderness of right leg noted Psychiatry:  Mood & affect appropriate.     Data Reviewed: I  have personally reviewed following labs and imaging studies  CBC: Recent Labs  Lab 12/31/16 1051 01/01/17 0514  WBC 11.9* 14.4*  NEUTROABS 10.7*  --   HGB 12.8 10.9*  HCT 39.8 34.5*  MCV 89.8 90.3  PLT 168 153   Basic Metabolic Panel: Recent Labs  Lab 12/31/16 1051 01/01/17 0514  NA 137 141  K 2.9* 3.5  CL 99* 105  CO2 29 29  GLUCOSE 138* 123*  BUN 9 13  CREATININE 0.62 0.60  CALCIUM 8.9 8.1*   GFR: Estimated Creatinine Clearance: 79.5 mL/min (by C-G formula based on SCr of 0.6 mg/dL). Liver Function Tests: Recent Labs  Lab 12/31/16 1051 01/01/17 0514  AST 21 23  ALT 16 15  ALKPHOS 136* 116  BILITOT 0.8 0.7  PROT 7.5 6.3*  ALBUMIN 4.0 3.1*   No results for input(s): LIPASE, AMYLASE in the last 168 hours. No results for input(s): AMMONIA in the last 168 hours. Coagulation Profile: Recent Labs  Lab 12/31/16 1051  INR 0.90   Cardiac Enzymes: No results for input(s): CKTOTAL, CKMB, CKMBINDEX, TROPONINI in the last 168 hours. BNP (last 3 results) No results for input(s): PROBNP in the last 8760 hours. HbA1C: No results for input(s): HGBA1C in the last 72 hours. CBG: No results for input(s): GLUCAP in the last 168 hours. Lipid Profile: No results for input(s): CHOL, HDL, LDLCALC, TRIG, CHOLHDL, LDLDIRECT in the last 72 hours. Thyroid Function Tests: No results for input(s): TSH, T4TOTAL, FREET4, T3FREE, THYROIDAB in the last 72 hours. Anemia Panel: No results for input(s): VITAMINB12, FOLATE, FERRITIN, TIBC, IRON, RETICCTPCT in the last 72 hours. Urine analysis:    Component Value Date/Time   COLORURINE YELLOW 12/31/2016 1051   APPEARANCEUR CLEAR 12/31/2016 1051   LABSPEC 1.013 12/31/2016 1051   PHURINE 8.0 12/31/2016 1051   GLUCOSEU NEGATIVE 12/31/2016 1051   HGBUR NEGATIVE 12/31/2016 1051   BILIRUBINUR NEGATIVE 12/31/2016 1051   KETONESUR NEGATIVE 12/31/2016 1051   PROTEINUR 30 (A) 12/31/2016 1051   UROBILINOGEN 0.2 06/11/2010 1149   NITRITE  NEGATIVE 12/31/2016 1051   LEUKOCYTESUR NEGATIVE 12/31/2016 1051   Sepsis Labs: @LABRCNTIP (procalcitonin:4,lacticidven:4) ) Recent Results (from the past 240 hour(s))  Culture, blood (Routine x 2)     Status: None (Preliminary result)   Collection Time: 12/31/16 10:51 AM  Result Value Ref Range Status   Specimen Description BLOOD  Final   Special Requests NONE  Final   Culture NO GROWTH < 24 HOURS  Final   Report Status PENDING  Incomplete  Culture, blood (Routine x 2)     Status: None (Preliminary result)   Collection Time: 12/31/16 11:01 AM  Result Value Ref Range Status   Specimen Description RIGHT ANTECUBITAL  Final   Special Requests   Final  BOTTLES DRAWN AEROBIC AND ANAEROBIC Blood Culture adequate volume   Culture PENDING  Incomplete   Report Status PENDING  Incomplete  MRSA PCR Screening     Status: None   Collection Time: 12/31/16  6:30 PM  Result Value Ref Range Status   MRSA by PCR NEGATIVE NEGATIVE Final    Comment:        The GeneXpert MRSA Assay (FDA approved for NASAL specimens only), is one component of a comprehensive MRSA colonization surveillance program. It is not intended to diagnose MRSA infection nor to guide or monitor treatment for MRSA infections.          Radiology Studies: Ct Head Wo Contrast  Result Date: 12/31/2016 CLINICAL DATA:  Altered mental status. EXAM: CT HEAD WITHOUT CONTRAST TECHNIQUE: Contiguous axial images were obtained from the base of the skull through the vertex without intravenous contrast. COMPARISON:  CT sinuses dated August 15, 2008. FINDINGS: Brain: No evidence of acute infarction, hemorrhage, hydrocephalus, extra-axial collection or mass lesion/mass effect. Vascular: No hyperdense vessel or unexpected calcification. Skull: Normal. Negative for fracture or focal lesion. Sinuses/Orbits: No acute finding. Other: None. IMPRESSION: 1.  No acute intracranial abnormality. Electronically Signed   By: Obie Dredge M.D.   On:  12/31/2016 15:27   Dg Chest Port 1 View  Result Date: 12/31/2016 CLINICAL DATA:  Code sepsis EXAM: PORTABLE CHEST 1 VIEW COMPARISON:  01/08/2013 FINDINGS: Cardiomegaly with vascular congestion. Bibasilar atelectasis. Mild interstitial prominence could reflect early interstitial edema. No effusions or acute bony abnormality. IMPRESSION: Cardiomegaly, question mild interstitial edema. Bibasilar atelectasis. Electronically Signed   By: Charlett Nose M.D.   On: 12/31/2016 11:51      Scheduled Meds: . albuterol  2 mg Oral TID  . buprenorphine-naloxone  1 tablet Sublingual TID  . diltiazem  180 mg Oral Daily  . enoxaparin (LOVENOX) injection  40 mg Subcutaneous Q24H  . loratadine  10 mg Oral Daily  . methocarbamol  500 mg Oral Daily  . tetrahydrozoline  1 drop Both Eyes q morning - 10a   Continuous Infusions: . sodium chloride 75 mL/hr at 01/01/17 0900  . piperacillin-tazobactam (ZOSYN)  IV 3.375 g (01/01/17 1119)     LOS: 0 days    Time spent in minutes: 40    Calvert Cantor, MD [facility] Pager: [email or web address] Password [email or web address] 01/01/2017, 2:28 PM

## 2017-01-01 NOTE — Plan of Care (Signed)
  Pain Managment: General experience of comfort will improve 12/31/2016 2359 - Progressing by Pamala Hurry, RN Note Pain controlled with medication and rest

## 2017-01-02 DIAGNOSIS — G894 Chronic pain syndrome: Secondary | ICD-10-CM

## 2017-01-02 DIAGNOSIS — A419 Sepsis, unspecified organism: Principal | ICD-10-CM

## 2017-01-02 DIAGNOSIS — M797 Fibromyalgia: Secondary | ICD-10-CM

## 2017-01-02 DIAGNOSIS — E876 Hypokalemia: Secondary | ICD-10-CM

## 2017-01-02 DIAGNOSIS — L03115 Cellulitis of right lower limb: Secondary | ICD-10-CM

## 2017-01-02 LAB — CBC
HEMATOCRIT: 32.9 % — AB (ref 36.0–46.0)
HEMOGLOBIN: 10.5 g/dL — AB (ref 12.0–15.0)
MCH: 29.2 pg (ref 26.0–34.0)
MCHC: 31.9 g/dL (ref 30.0–36.0)
MCV: 91.4 fL (ref 78.0–100.0)
Platelets: 158 10*3/uL (ref 150–400)
RBC: 3.6 MIL/uL — AB (ref 3.87–5.11)
RDW: 13.8 % (ref 11.5–15.5)
WBC: 9.7 10*3/uL (ref 4.0–10.5)

## 2017-01-02 LAB — BASIC METABOLIC PANEL
Anion gap: 8 (ref 5–15)
BUN: 10 mg/dL (ref 6–20)
CHLORIDE: 106 mmol/L (ref 101–111)
CO2: 28 mmol/L (ref 22–32)
Calcium: 8.4 mg/dL — ABNORMAL LOW (ref 8.9–10.3)
Creatinine, Ser: 0.53 mg/dL (ref 0.44–1.00)
GFR calc non Af Amer: >60 mL/min (ref >60)
Glucose, Bld: 116 mg/dL — ABNORMAL HIGH (ref 65–99)
POTASSIUM: 3.2 mmol/L — AB (ref 3.5–5.1)
SODIUM: 142 mmol/L (ref 135–145)

## 2017-01-02 LAB — URINE CULTURE: CULTURE: NO GROWTH

## 2017-01-02 LAB — MAGNESIUM: Magnesium: 1.9 mg/dL (ref 1.7–2.4)

## 2017-01-02 MED ORDER — SACCHAROMYCES BOULARDII 250 MG PO CAPS: 250.0000 mg | ORAL_CAPSULE | Freq: Two times a day (BID) | ORAL | 0 refills | Status: DC

## 2017-01-02 MED ORDER — POTASSIUM CHLORIDE CRYS ER 20 MEQ PO TBCR
40.0000 meq | EXTENDED_RELEASE_TABLET | ORAL | Status: AC
  Administered 2017-01-02 (×2): 40 meq via ORAL
  Filled 2017-01-02 (×2): qty 2

## 2017-01-02 MED ORDER — PANTOPRAZOLE SODIUM 40 MG PO TBEC: 40.0000 mg | DELAYED_RELEASE_TABLET | Freq: Every day | ORAL | 1 refills | Status: DC

## 2017-01-02 MED ORDER — AMOXICILLIN-POT CLAVULANATE 875-125 MG PO TABS: 1.0000 | ORAL_TABLET | Freq: Two times a day (BID) | ORAL | 0 refills | Status: DC

## 2017-01-02 MED ORDER — AMOXICILLIN-POT CLAVULANATE 875-125 MG PO TABS: 1.0000 | ORAL_TABLET | Freq: Two times a day (BID) | ORAL | 0 refills | Status: AC

## 2017-01-02 MED ORDER — BUTALBITAL-APAP-CAFFEINE 50-325-40 MG PO TABS: 1.0000 | ORAL_TABLET | ORAL | Status: DC | PRN

## 2017-01-02 NOTE — Discharge Summary (Signed)
Physician Discharge Summary  [name] [name] BLT:[id number] DOB: [birth year] DOA: 12/31/2016  PCP: [name], MD  Admit date: 12/31/2016 Discharge date: 01/02/2017  Time spent: 35 minutes  Recommendations for Outpatient Follow-up:  1. Repeat basic metabolic panel to follow electrolytes and renal function 2. Reassess right lower extremity to assure resolution of cellulitic process.   Discharge Diagnoses:  Principal Problem:   Cellulitis of right leg Active Problems:   Sepsis (HCC)   Chronic pain   Asthma   Fibromyalgia   Hypokalemia   Obesity, Class III, BMI 40-49.9 (morbid obesity) (HCC) Toxic metabolic encephalopathy   Discharge Condition: Stable and improved.  Patient discharged back home with instruction to follow-up with PCP in 10 days.  Diet recommendation: Heart healthy diet  Filed Weights   12/31/16 1048  Weight: 98.9 kg (218 lb)    History of present illness:  66 y/o F with asthma, fibromyalgia, HTN, glaucoma on chronic narcotics for pain who was accidentally scracted by her cat a couple of days ago and comes in for redness and swelling of the right leg. She was found to be confused with a temp of 105 at home. Lactic acid was 2.23 in the ER and she was admitted for cellulitis. She was given Vanc and Zosyn for sepsis.   Hospital Course:  1-sepsis secondary to right lower extremity cellulitis -Patient with history of been scratched by a cat with subsequent development of redness, swelling and pain on her right lower extremity. -Excellent response to IV antibiotics (patient was on Zosyn). -At discharge no longer experiencing any fever, normal WBCs and with improvement in the cellulitic process.  All sepsis features resolve prior to discharge. -Patient will go home on 8 days of Augmentin to complete antibiotic therapy. -Advised to keep her leg elevated, use cold compresses, PRN analgesics/antipyretics and to follow-up with her PCP in 10 days.  2-acute toxic  encephalopathy -Associated with infection -Resolved prior to discharge -CT head demonstrated no acute intracranial abnormalities on admission.  3-hypokalemia -repleted  4-history of asthma -Stable -Continue home medications  5-anxiety -continue xanax  6-chronic pain/fibromyalgia -continue Suboxone   7-hx of Migraine -Stable and well-controlled currently -Continue as needed Fioricet  8-morbid obesity -Body mass index is 36.28 kg/m. -low calorie diet and increase exercise discussed with patient   Procedures:  See below for x-ray report  Consultations:  None  Discharge Exam: Vitals:   01/02/17 0552 01/02/17 1335  BP: (!) 160/88 116/61  Pulse: 78 77  Resp: 16 18  Temp: 98.2 F (36.8 C) 99 F (37.2 C)  SpO2: 100% 94%    General: Afebrile, no chest pain, reports breathing is stable and at baseline.  Patient denies nausea, vomiting, abdominal pain or dysuria.  Reported improvement in the swelling and pain of her right lower extremity. Cardiovascular: S1 and S2, no rubs, no gallops, no JVD appreciated (difficult to assess secondary to body habitus). Respiratory: Good air movement bilaterally, no wheezing, no crackles. Abdomen: Soft, nontender, nondistended, positive bowel sounds Extremities: Right lower extremity still with erythema, warm sensation and swelling (even improved overall); there was no drainage or suppuration  Appreciated. Neurological: no focal motor or neurologic deficit appreciated.  Cranial nerves II through XII grossly intact.  Discharge Instructions   Discharge Instructions    Diet - low sodium heart healthy   Complete by:  As directed    Discharge instructions   Complete by:  As directed    Keep yourself well-hydrated Take medications as prescribed Arrange follow-up with PCP  in 10 days Make sure to take antibiotics around food to minimize gastrointestinal side effects. Apply cool compresses and maintain leg elevated as much as possible to  assist with swelling and improvement in blood flow.     Current Discharge Medication List    START taking these medications   Details  amoxicillin-clavulanate (AUGMENTIN) 875-125 MG tablet Take 1 tablet 2 (two) times daily for 8 days by mouth. Qty: 16 tablet, Refills: 0    pantoprazole (PROTONIX) 40 MG tablet Take 1 tablet (40 mg total) daily by mouth. Qty: 30 tablet, Refills: 1    saccharomyces boulardii (FLORASTOR) 250 MG capsule Take 1 capsule (250 mg total) 2 (two) times daily by mouth. Qty: 30 capsule, Refills: 0      CONTINUE these medications which have NOT CHANGED   Details  albuterol (PROVENTIL) 2 MG tablet Take 2 mg by mouth 3 (three) times daily.    ALPRAZolam (XANAX) 0.5 MG tablet Take 0.5 mg 4 (four) times daily as needed by mouth for anxiety.    buprenorphine-naloxone (SUBOXONE) 8-2 MG SUBL Place 1 tablet under the tongue 4 (four) times daily as needed. Pain.    diltiazem (CARDIZEM) 120 MG tablet Take 180 mg by mouth daily.    fexofenadine (ALLEGRA) 180 MG tablet Take 180 mg by mouth daily.    methocarbamol (ROBAXIN) 500 MG tablet Take 500 mg daily by mouth.    Tetrahydrozoline HCl (VISINE OP) Apply 1 drop to eye every morning.    butalbital-acetaminophen-caffeine (FIORICET, ESGIC) 50-325-40 MG per tablet Take 1 tablet by mouth 4 (four) times daily.    Menthol-Methyl Salicylate (GNP MUSCLE RUB EX) Apply 1 application topically daily as needed. Pain.       No Known Allergies Follow-up Information    [name], MD. Schedule an appointment as soon as possible for a visit in 10 day(s).   Specialty:  Internal Medicine Contact information: [address] [place] [address] [phone]           The results of significant diagnostics from this hospitalization (including imaging, microbiology, ancillary and laboratory) are listed below for reference.    Significant Diagnostic Studies: Ct Head Wo Contrast  Result Date:  12/31/2016 CLINICAL DATA:  Altered mental status. EXAM: CT HEAD WITHOUT CONTRAST TECHNIQUE: Contiguous axial images were obtained from the base of the skull through the vertex without intravenous contrast. COMPARISON:  CT sinuses dated August 15, 2008. FINDINGS: Brain: No evidence of acute infarction, hemorrhage, hydrocephalus, extra-axial collection or mass lesion/mass effect. Vascular: No hyperdense vessel or unexpected calcification. Skull: Normal. Negative for fracture or focal lesion. Sinuses/Orbits: No acute finding. Other: None. IMPRESSION: 1.  No acute intracranial abnormality. Electronically Signed   By: Obie Dredge M.D.   On: 12/31/2016 15:27   Dg Chest Port 1 View  Result Date: 12/31/2016 CLINICAL DATA:  Code sepsis EXAM: PORTABLE CHEST 1 VIEW COMPARISON:  01/08/2013 FINDINGS: Cardiomegaly with vascular congestion. Bibasilar atelectasis. Mild interstitial prominence could reflect early interstitial edema. No effusions or acute bony abnormality. IMPRESSION: Cardiomegaly, question mild interstitial edema. Bibasilar atelectasis. Electronically Signed   By: Charlett Nose M.D.   On: 12/31/2016 11:51    Microbiology: Recent Results (from the past 240 hour(s))  Culture, blood (Routine x 2)     Status: None (Preliminary result)   Collection Time: 12/31/16 10:51 AM  Result Value Ref Range Status   Specimen Description BLOOD LEFT ARM DRAWN BY RN  Final   Special Requests   Final    BOTTLES  DRAWN AEROBIC AND ANAEROBIC Blood Culture adequate volume   Culture NO GROWTH 2 DAYS  Final   Report Status PENDING  Incomplete  Culture, blood (Routine x 2)     Status: None (Preliminary result)   Collection Time: 12/31/16 11:01 AM  Result Value Ref Range Status   Specimen Description RIGHT ANTECUBITAL  Final   Special Requests   Final    BOTTLES DRAWN AEROBIC AND ANAEROBIC Blood Culture adequate volume   Culture PENDING  Incomplete   Report Status PENDING  Incomplete  Urine culture     Status: None    Collection Time: 12/31/16 11:26 AM  Result Value Ref Range Status   Specimen Description URINE, CLEAN CATCH  Final   Special Requests NONE  Final   Culture   Final    NO GROWTH Performed at [facility], [address] [place], [place] [address]    Report Status 01/02/2017 FINAL  Final  MRSA PCR Screening     Status: None   Collection Time: 12/31/16  6:30 PM  Result Value Ref Range Status   MRSA by PCR NEGATIVE NEGATIVE Final    Comment:        The GeneXpert MRSA Assay (FDA approved for NASAL specimens only), is one component of a comprehensive MRSA colonization surveillance program. It is not intended to diagnose MRSA infection nor to guide or monitor treatment for MRSA infections.      Labs: Basic Metabolic Panel: Recent Labs  Lab 12/31/16 1051 01/01/17 0514 01/02/17 0533  NA 137 141 142  K 2.9* 3.5 3.2*  CL 99* 105 106  CO2 29 29 28   GLUCOSE 138* 123* 116*  BUN 9 13 10   CREATININE 0.62 0.60 0.53  CALCIUM 8.9 8.1* 8.4*  MG  --   --  1.9   Liver Function Tests: Recent Labs  Lab 12/31/16 1051 01/01/17 0514  AST 21 23  ALT 16 15  ALKPHOS 136* 116  BILITOT 0.8 0.7  PROT 7.5 6.3*  ALBUMIN 4.0 3.1*   CBC: Recent Labs  Lab 12/31/16 1051 01/01/17 0514 01/02/17 0533  WBC 11.9* 14.4* 9.7  NEUTROABS 10.7*  --   --   HGB 12.8 10.9* 10.5*  HCT 39.8 34.5* 32.9*  MCV 89.8 90.3 91.4  PLT 168 153 158    Signed:  01/03/17 MD.  [facility] 01/02/2017, 2:42 PM

## 2017-01-02 NOTE — Care Management Note (Signed)
Case Management Note  Patient Details  Name: [name] [name] MRN: [id number] Date of Birth: December 07, 1950  Subjective/Objective:           Admitted with cellulitis from cat bite. Chart reviewed for CM needs. Pt is from home, lives with spouse. She has PCP, transportation and insurance with drug coverage. She has no HH or DME needs pta. She is on the [facility] registry and not active. This is her only admission in the last 6 months.          Action/Plan: DC home today with self care. No CM needs noted at DC.   Expected Discharge Date:  01/02/17               Expected Discharge Plan:  Home/Self Care  In-House Referral:  NA  Discharge planning Services  NA  Post Acute Care Choice:  NA Choice offered to:  NA  Status of Service:  Completed, signed off  Malcolm Metro, RN 01/02/2017, 3:15 PM

## 2017-01-02 NOTE — Progress Notes (Signed)
Pt d/c home in stable condition with spouse at bedside.  All medications reviewed.  Encouraged to f/u with pcp in two weeks.  Verbal understanding.  All questions/concerns reviewed.  D/C'd in stable condition.

## 2017-01-04 LAB — BLOOD CULTURE ID PANEL (REFLEXED)
Acinetobacter baumannii: NOT DETECTED
Candida albicans: NOT DETECTED
Candida glabrata: NOT DETECTED
Candida krusei: NOT DETECTED
Candida parapsilosis: NOT DETECTED
Candida tropicalis: NOT DETECTED
ENTEROCOCCUS SPECIES: NOT DETECTED
ESCHERICHIA COLI: NOT DETECTED
Enterobacter cloacae complex: NOT DETECTED
Enterobacteriaceae species: NOT DETECTED
HAEMOPHILUS INFLUENZAE: NOT DETECTED
Klebsiella oxytoca: NOT DETECTED
Klebsiella pneumoniae: NOT DETECTED
LISTERIA MONOCYTOGENES: NOT DETECTED
Neisseria meningitidis: NOT DETECTED
PSEUDOMONAS AERUGINOSA: NOT DETECTED
Proteus species: NOT DETECTED
SERRATIA MARCESCENS: NOT DETECTED
STAPHYLOCOCCUS AUREUS BCID: NOT DETECTED
STREPTOCOCCUS PNEUMONIAE: NOT DETECTED
STREPTOCOCCUS PYOGENES: NOT DETECTED
STREPTOCOCCUS SPECIES: NOT DETECTED
Staphylococcus species: NOT DETECTED
Streptococcus agalactiae: NOT DETECTED

## 2017-01-04 NOTE — ED Notes (Signed)
Lab called and notified Charge RN in ER of positive blood culture that was drawn on pt during admission,  Blood culture was anaerobic with gram negative rods. Dr Sharl Ma notified, no additional orders given,

## 2017-01-05 LAB — CULTURE, BLOOD (ROUTINE X 2)
Culture: NO GROWTH
Special Requests: ADEQUATE

## 2017-01-09 LAB — CULTURE, BLOOD (ROUTINE X 2): SPECIAL REQUESTS: ADEQUATE

## 2017-01-16 DIAGNOSIS — W5503XA Scratched by cat, initial encounter: Secondary | ICD-10-CM | POA: Diagnosis not present

## 2017-01-16 DIAGNOSIS — A419 Sepsis, unspecified organism: Secondary | ICD-10-CM | POA: Diagnosis not present

## 2017-01-16 DIAGNOSIS — Z6841 Body Mass Index (BMI) 40.0 and over, adult: Secondary | ICD-10-CM | POA: Diagnosis not present

## 2017-01-16 DIAGNOSIS — G92 Toxic encephalopathy: Secondary | ICD-10-CM | POA: Diagnosis not present

## 2017-01-16 DIAGNOSIS — I872 Venous insufficiency (chronic) (peripheral): Secondary | ICD-10-CM | POA: Diagnosis not present

## 2017-01-16 DIAGNOSIS — Z1389 Encounter for screening for other disorder: Secondary | ICD-10-CM | POA: Diagnosis not present

## 2017-01-16 DIAGNOSIS — I10 Essential (primary) hypertension: Secondary | ICD-10-CM | POA: Diagnosis not present

## 2017-01-16 DIAGNOSIS — R600 Localized edema: Secondary | ICD-10-CM | POA: Diagnosis not present

## 2017-01-16 DIAGNOSIS — E669 Obesity, unspecified: Secondary | ICD-10-CM | POA: Diagnosis not present

## 2017-05-26 DIAGNOSIS — M1991 Primary osteoarthritis, unspecified site: Secondary | ICD-10-CM | POA: Diagnosis not present

## 2017-05-26 DIAGNOSIS — I10 Essential (primary) hypertension: Secondary | ICD-10-CM | POA: Diagnosis not present

## 2017-05-26 DIAGNOSIS — J329 Chronic sinusitis, unspecified: Secondary | ICD-10-CM | POA: Diagnosis not present

## 2017-05-26 DIAGNOSIS — Z6841 Body Mass Index (BMI) 40.0 and over, adult: Secondary | ICD-10-CM | POA: Diagnosis not present

## 2017-09-04 DIAGNOSIS — M2011 Hallux valgus (acquired), right foot: Secondary | ICD-10-CM | POA: Diagnosis not present

## 2017-09-04 DIAGNOSIS — B070 Plantar wart: Secondary | ICD-10-CM | POA: Diagnosis not present

## 2017-09-04 DIAGNOSIS — M7741 Metatarsalgia, right foot: Secondary | ICD-10-CM | POA: Diagnosis not present

## 2017-09-04 DIAGNOSIS — M2012 Hallux valgus (acquired), left foot: Secondary | ICD-10-CM | POA: Diagnosis not present

## 2017-09-04 DIAGNOSIS — M7742 Metatarsalgia, left foot: Secondary | ICD-10-CM | POA: Diagnosis not present

## 2017-09-04 DIAGNOSIS — R52 Pain, unspecified: Secondary | ICD-10-CM | POA: Diagnosis not present

## 2017-11-08 DIAGNOSIS — Z6841 Body Mass Index (BMI) 40.0 and over, adult: Secondary | ICD-10-CM | POA: Diagnosis not present

## 2017-11-08 DIAGNOSIS — M1991 Primary osteoarthritis, unspecified site: Secondary | ICD-10-CM | POA: Diagnosis not present

## 2017-11-08 DIAGNOSIS — Z1389 Encounter for screening for other disorder: Secondary | ICD-10-CM | POA: Diagnosis not present

## 2017-11-08 DIAGNOSIS — I10 Essential (primary) hypertension: Secondary | ICD-10-CM | POA: Diagnosis not present

## 2018-04-09 DIAGNOSIS — J9801 Acute bronchospasm: Secondary | ICD-10-CM | POA: Diagnosis not present

## 2018-04-09 DIAGNOSIS — Z1389 Encounter for screening for other disorder: Secondary | ICD-10-CM | POA: Diagnosis not present

## 2018-04-09 DIAGNOSIS — J329 Chronic sinusitis, unspecified: Secondary | ICD-10-CM | POA: Diagnosis not present

## 2018-04-09 DIAGNOSIS — I10 Essential (primary) hypertension: Secondary | ICD-10-CM | POA: Diagnosis not present

## 2018-04-30 DIAGNOSIS — G894 Chronic pain syndrome: Secondary | ICD-10-CM | POA: Diagnosis not present

## 2018-04-30 DIAGNOSIS — I10 Essential (primary) hypertension: Secondary | ICD-10-CM | POA: Diagnosis not present

## 2018-04-30 DIAGNOSIS — F419 Anxiety disorder, unspecified: Secondary | ICD-10-CM | POA: Diagnosis not present

## 2018-04-30 DIAGNOSIS — Z6841 Body Mass Index (BMI) 40.0 and over, adult: Secondary | ICD-10-CM | POA: Diagnosis not present

## 2018-06-27 DIAGNOSIS — R7301 Impaired fasting glucose: Secondary | ICD-10-CM | POA: Diagnosis not present

## 2018-06-27 DIAGNOSIS — F419 Anxiety disorder, unspecified: Secondary | ICD-10-CM | POA: Diagnosis not present

## 2018-06-27 DIAGNOSIS — M1991 Primary osteoarthritis, unspecified site: Secondary | ICD-10-CM | POA: Diagnosis not present

## 2018-06-27 DIAGNOSIS — G43909 Migraine, unspecified, not intractable, without status migrainosus: Secondary | ICD-10-CM | POA: Diagnosis not present

## 2018-06-27 DIAGNOSIS — E669 Obesity, unspecified: Secondary | ICD-10-CM | POA: Diagnosis not present

## 2018-06-27 DIAGNOSIS — Z1389 Encounter for screening for other disorder: Secondary | ICD-10-CM | POA: Diagnosis not present

## 2018-06-27 DIAGNOSIS — R7309 Other abnormal glucose: Secondary | ICD-10-CM | POA: Diagnosis not present

## 2018-06-27 DIAGNOSIS — Z6841 Body Mass Index (BMI) 40.0 and over, adult: Secondary | ICD-10-CM | POA: Diagnosis not present

## 2018-06-27 DIAGNOSIS — G894 Chronic pain syndrome: Secondary | ICD-10-CM | POA: Diagnosis not present

## 2018-06-27 DIAGNOSIS — Z0000 Encounter for general adult medical examination without abnormal findings: Secondary | ICD-10-CM | POA: Diagnosis not present

## 2018-06-27 DIAGNOSIS — J4520 Mild intermittent asthma, uncomplicated: Secondary | ICD-10-CM | POA: Diagnosis not present

## 2018-06-27 DIAGNOSIS — I10 Essential (primary) hypertension: Secondary | ICD-10-CM | POA: Diagnosis not present

## 2018-08-16 DIAGNOSIS — M1991 Primary osteoarthritis, unspecified site: Secondary | ICD-10-CM | POA: Diagnosis not present

## 2018-08-16 DIAGNOSIS — Z6841 Body Mass Index (BMI) 40.0 and over, adult: Secondary | ICD-10-CM | POA: Diagnosis not present

## 2018-08-16 DIAGNOSIS — F419 Anxiety disorder, unspecified: Secondary | ICD-10-CM | POA: Diagnosis not present

## 2018-08-16 DIAGNOSIS — M249 Joint derangement, unspecified: Secondary | ICD-10-CM | POA: Diagnosis not present

## 2018-08-16 DIAGNOSIS — G894 Chronic pain syndrome: Secondary | ICD-10-CM | POA: Diagnosis not present

## 2018-08-24 DIAGNOSIS — Z6841 Body Mass Index (BMI) 40.0 and over, adult: Secondary | ICD-10-CM | POA: Diagnosis not present

## 2018-08-24 DIAGNOSIS — G894 Chronic pain syndrome: Secondary | ICD-10-CM | POA: Diagnosis not present

## 2018-10-25 DIAGNOSIS — Z79891 Long term (current) use of opiate analgesic: Secondary | ICD-10-CM | POA: Diagnosis not present

## 2018-11-19 DIAGNOSIS — Z79891 Long term (current) use of opiate analgesic: Secondary | ICD-10-CM | POA: Diagnosis not present

## 2018-12-06 DIAGNOSIS — Z23 Encounter for immunization: Secondary | ICD-10-CM | POA: Diagnosis not present

## 2018-12-17 DIAGNOSIS — Z79891 Long term (current) use of opiate analgesic: Secondary | ICD-10-CM | POA: Diagnosis not present

## 2019-01-02 DIAGNOSIS — Z6841 Body Mass Index (BMI) 40.0 and over, adult: Secondary | ICD-10-CM | POA: Diagnosis not present

## 2019-01-02 DIAGNOSIS — G43909 Migraine, unspecified, not intractable, without status migrainosus: Secondary | ICD-10-CM | POA: Diagnosis not present

## 2019-01-02 DIAGNOSIS — I10 Essential (primary) hypertension: Secondary | ICD-10-CM | POA: Diagnosis not present

## 2019-01-02 DIAGNOSIS — M1991 Primary osteoarthritis, unspecified site: Secondary | ICD-10-CM | POA: Diagnosis not present

## 2019-02-25 DIAGNOSIS — Z79891 Long term (current) use of opiate analgesic: Secondary | ICD-10-CM | POA: Diagnosis not present

## 2019-03-11 DIAGNOSIS — F411 Generalized anxiety disorder: Secondary | ICD-10-CM | POA: Diagnosis not present

## 2019-03-25 DIAGNOSIS — Z79891 Long term (current) use of opiate analgesic: Secondary | ICD-10-CM | POA: Diagnosis not present

## 2019-04-08 DIAGNOSIS — Z79891 Long term (current) use of opiate analgesic: Secondary | ICD-10-CM | POA: Diagnosis not present

## 2019-07-01 DIAGNOSIS — Z79899 Other long term (current) drug therapy: Secondary | ICD-10-CM | POA: Diagnosis not present

## 2019-08-16 DIAGNOSIS — F411 Generalized anxiety disorder: Secondary | ICD-10-CM | POA: Diagnosis not present

## 2019-08-30 DIAGNOSIS — F411 Generalized anxiety disorder: Secondary | ICD-10-CM | POA: Diagnosis not present

## 2019-12-04 DIAGNOSIS — Z23 Encounter for immunization: Secondary | ICD-10-CM | POA: Diagnosis not present

## 2020-01-03 DIAGNOSIS — Z20828 Contact with and (suspected) exposure to other viral communicable diseases: Secondary | ICD-10-CM | POA: Diagnosis not present

## 2020-01-15 DIAGNOSIS — F419 Anxiety disorder, unspecified: Secondary | ICD-10-CM | POA: Diagnosis not present

## 2020-01-15 DIAGNOSIS — R300 Dysuria: Secondary | ICD-10-CM | POA: Diagnosis not present

## 2020-01-15 DIAGNOSIS — Z0001 Encounter for general adult medical examination with abnormal findings: Secondary | ICD-10-CM | POA: Diagnosis not present

## 2020-01-15 DIAGNOSIS — Z1331 Encounter for screening for depression: Secondary | ICD-10-CM | POA: Diagnosis not present

## 2020-01-15 DIAGNOSIS — N390 Urinary tract infection, site not specified: Secondary | ICD-10-CM | POA: Diagnosis not present

## 2020-01-15 DIAGNOSIS — Z6841 Body Mass Index (BMI) 40.0 and over, adult: Secondary | ICD-10-CM | POA: Diagnosis not present

## 2020-01-15 DIAGNOSIS — G894 Chronic pain syndrome: Secondary | ICD-10-CM | POA: Diagnosis not present

## 2020-01-15 DIAGNOSIS — J4520 Mild intermittent asthma, uncomplicated: Secondary | ICD-10-CM | POA: Diagnosis not present

## 2020-01-15 DIAGNOSIS — J309 Allergic rhinitis, unspecified: Secondary | ICD-10-CM | POA: Diagnosis not present

## 2020-01-15 DIAGNOSIS — G43909 Migraine, unspecified, not intractable, without status migrainosus: Secondary | ICD-10-CM | POA: Diagnosis not present

## 2020-01-21 DIAGNOSIS — I10 Essential (primary) hypertension: Secondary | ICD-10-CM | POA: Diagnosis not present

## 2020-01-21 DIAGNOSIS — Z6841 Body Mass Index (BMI) 40.0 and over, adult: Secondary | ICD-10-CM | POA: Diagnosis not present

## 2020-01-21 DIAGNOSIS — Z1389 Encounter for screening for other disorder: Secondary | ICD-10-CM | POA: Diagnosis not present

## 2020-01-21 DIAGNOSIS — M1991 Primary osteoarthritis, unspecified site: Secondary | ICD-10-CM | POA: Diagnosis not present

## 2020-01-21 DIAGNOSIS — Z0001 Encounter for general adult medical examination with abnormal findings: Secondary | ICD-10-CM | POA: Diagnosis not present

## 2020-01-21 DIAGNOSIS — J309 Allergic rhinitis, unspecified: Secondary | ICD-10-CM | POA: Diagnosis not present

## 2020-01-21 DIAGNOSIS — N390 Urinary tract infection, site not specified: Secondary | ICD-10-CM | POA: Diagnosis not present

## 2020-02-08 ENCOUNTER — Encounter (HOSPITAL_COMMUNITY): Payer: Self-pay | Admitting: Emergency Medicine

## 2020-02-08 ENCOUNTER — Emergency Department (HOSPITAL_COMMUNITY)
Admission: EM | Admit: 2020-02-08 | Discharge: 2020-02-08 | Disposition: A | Discharge status: Home / Self Care | Payer: Medicare Other | Attending: Emergency Medicine | Admitting: Emergency Medicine

## 2020-02-08 ENCOUNTER — Other Ambulatory Visit: Payer: Self-pay

## 2020-02-08 DIAGNOSIS — J45909 Unspecified asthma, uncomplicated: Secondary | ICD-10-CM | POA: Insufficient documentation

## 2020-02-08 DIAGNOSIS — Z79899 Other long term (current) drug therapy: Secondary | ICD-10-CM | POA: Diagnosis not present

## 2020-02-08 DIAGNOSIS — U071 COVID-19: Secondary | ICD-10-CM | POA: Insufficient documentation

## 2020-02-08 DIAGNOSIS — I10 Essential (primary) hypertension: Secondary | ICD-10-CM | POA: Insufficient documentation

## 2020-02-08 DIAGNOSIS — Z209 Contact with and (suspected) exposure to unspecified communicable disease: Secondary | ICD-10-CM | POA: Diagnosis not present

## 2020-02-08 DIAGNOSIS — R1110 Vomiting, unspecified: Secondary | ICD-10-CM | POA: Diagnosis present

## 2020-02-08 DIAGNOSIS — R509 Fever, unspecified: Secondary | ICD-10-CM | POA: Diagnosis not present

## 2020-02-08 DIAGNOSIS — R410 Disorientation, unspecified: Secondary | ICD-10-CM | POA: Diagnosis not present

## 2020-02-08 LAB — RESP PANEL BY RT-PCR (FLU A&B, COVID) ARPGX2
Influenza A by PCR: NEGATIVE
Influenza B by PCR: NEGATIVE
SARS Coronavirus 2 by RT PCR: POSITIVE — AB

## 2020-02-08 MED ORDER — ACETAMINOPHEN 325 MG PO TABS
650.0000 mg | ORAL_TABLET | Freq: Once | ORAL | Status: AC
  Administered 2020-02-08: 06:00:00 650 mg via ORAL
  Filled 2020-02-08: qty 2

## 2020-02-08 NOTE — ED Triage Notes (Signed)
[facility] - EMS called out for sick person. Pt BP 248/138. positive home covid yesterday

## 2020-02-08 NOTE — ED Provider Notes (Signed)
[facility] EMERGENCY DEPARTMENT Provider Note   CSN: [id number] Arrival date & time: 02/08/20  0535     History Chief Complaint  Patient presents with  . Hypertension    [name] [name] is a 69 y.o. female.  HPI     This is a 69 year old female with a history of asthma, glaucoma, hypertension, OSA who presents with high blood pressure.  Per EMS they were called out as the patient was disoriented.  She tested positive for COVID-19 2 days ago.  She reports that her whole family was sick that her daughter made her take a test.  She denies any symptoms at this time although she is noted to be febrile to 101.  She denies any shortness of breath, chest pain, headache, abdominal pain, nausea, vomiting.  Patient reports "I do not know why I am here."  She reports that her daughter called 911 and "she wanted me to be checked out."  I spoke to the patient's daughter Leotis Shames.  Reports that she found her vomit 4 AM sitting on the side of the bed and she appears somewhat disoriented.  She called 911.  They noted the high blood pressure and recommended that she come to the hospital.  She states that her blood pressure medicines have not been given to her in the last several days.  She reports that she has been giving Tylenol and Motrin to the whole family as they are Covid positive.  Past Medical History:  Diagnosis Date  . Anxiety   . Asthma   . Ectopic pregnancy   . Fibromyalgia   . Glaucoma   . Hypertension   . OSA (obstructive sleep apnea)    o2 at 2.5 liters at night  . Rheumatoid arthritis(714.0)     Patient Active Problem List   Diagnosis Date Noted  . Hypokalemia   . Obesity, Class III, BMI 40-49.9 (morbid obesity) (HCC)   . Chronic pain 01/01/2017  . Asthma 01/01/2017  . Fibromyalgia 01/01/2017  . Cellulitis of right leg 01/01/2017  . Sepsis (HCC) 12/31/2016    Past Surgical History:  Procedure Laterality Date  . BACK SURGERY    . CATARACT EXTRACTION    . CATARACT  EXTRACTION W/PHACO  01/16/2012   Procedure: CATARACT EXTRACTION PHACO AND INTRAOCULAR LENS PLACEMENT (IOC);  Surgeon: Susa Simmonds, MD;  Location: [facility];  Service: Ophthalmology;  Laterality: Right;  CDE: 14.05  . CERVICAL LAMINECTOMY     with plating  . CHOLECYSTECTOMY    . ECTOPIC PREGNANCY SURGERY    . KNEE ARTHROSCOPY    . TUBAL LIGATION    . vericose vein surgery       OB History   No obstetric history on file.     No family history on file.  Social History   Tobacco Use  . Smoking status: Never Smoker  . Smokeless tobacco: Never Used  Substance Use Topics  . Alcohol use: No  . Drug use: No    Home Medications Prior to Admission medications   Medication Sig Start Date End Date Taking? Authorizing Provider  albuterol (PROVENTIL) 2 MG tablet Take 2 mg by mouth 3 (three) times daily.    [provider]  ALPRAZolam Prudy Feeler) 0.5 MG tablet Take 0.5 mg 4 (four) times daily as needed by mouth for anxiety. 12/29/16   [provider]  buprenorphine-naloxone (SUBOXONE) 8-2 MG SUBL Place 1 tablet under the tongue 4 (four) times daily as needed. Pain.    [provider]  butalbital-acetaminophen-caffeine (FIORICET, ESGIC) 50-325-40 MG per tablet Take 1 tablet by mouth 4 (four) times daily.    [provider]  diltiazem (CARDIZEM) 120 MG tablet Take 180 mg by mouth daily.    [provider]  fexofenadine (ALLEGRA) 180 MG tablet Take 180 mg by mouth daily.    [provider]  Menthol-Methyl Salicylate (GNP MUSCLE RUB EX) Apply 1 application topically daily as needed. Pain.    [provider]  methocarbamol (ROBAXIN) 500 MG tablet Take 500 mg daily by mouth. 12/04/16   [provider]  pantoprazole (PROTONIX) 40 MG tablet Take 1 tablet (40 mg total) daily by mouth. 01/02/17 01/02/18  Vassie Loll, MD  saccharomyces boulardii (FLORASTOR) 250 MG capsule Take 1 capsule (250 mg total) 2 (two) times daily by mouth.  01/02/17   Vassie Loll, MD  Tetrahydrozoline HCl (VISINE OP) Apply 1 drop to eye every morning.    [provider]    Allergies    Patient has no known allergies.  Review of Systems   Review of Systems  Constitutional: Positive for fever.  Respiratory: Negative for chest tightness and shortness of breath.   Cardiovascular: Negative for chest pain.  Gastrointestinal: Negative for abdominal pain, nausea and vomiting.  Genitourinary: Negative for dysuria.  All other systems reviewed and are negative.   Physical Exam Updated Vital Signs BP (!) 145/90 (BP Location: Right Arm)   Pulse 85   Temp 100.2 F (37.9 C) (Oral)   Resp 18   Ht 1.651 m (5\' 5" )   Wt 98.9 kg   SpO2 96%   BMI 36.28 kg/m   Physical Exam Vitals and nursing note reviewed.  Constitutional:      Appearance: She is well-developed and well-nourished. She is obese.  HENT:     Head: Normocephalic and atraumatic.     Nose: Nose normal.  Eyes:     Pupils: Pupils are equal, round, and reactive to light.  Cardiovascular:     Rate and Rhythm: Normal rate and regular rhythm.     Heart sounds: Normal heart sounds.  Pulmonary:     Effort: Pulmonary effort is normal. No respiratory distress.     Breath sounds: No wheezing.  Abdominal:     Palpations: Abdomen is soft.     Tenderness: There is no abdominal tenderness. There is no guarding or rebound.  Musculoskeletal:     Cervical back: Neck supple.     Right lower leg: Edema present.     Left lower leg: Edema present.  Skin:    General: Skin is warm and dry.     Comments: Chronic venous stasis changes of the bilateral lower extremity  Neurological:     Mental Status: She is alert and oriented to person, place, and time.  Psychiatric:        Mood and Affect: Mood and affect and mood normal.     ED Results / Procedures / Treatments   Labs (all labs ordered are listed, but only abnormal results are displayed) Labs Reviewed  RESP PANEL BY RT-PCR  (FLU A&B, COVID) [id number]    EKG None ED ECG REPORT   Date: 02/08/2020  Rate: 91  Rhythm: normal sinus rhythm  QRS Axis: normal  Intervals: normal  ST/T Wave abnormalities: normal  Conduction Disutrbances:none, right bundle branch block and left anterior fascicular block  Narrative Interpretation: bigeminy  Old EKG Reviewed: none available  I have personally reviewed the EKG tracing and agree with the computerized  printout as noted. Radiology No results found.  Procedures Procedures (including critical care time)  Medications Ordered in ED Medications  acetaminophen (TYLENOL) tablet 650 mg (650 mg Oral Given 02/08/20 0559)    ED Course  I have reviewed the triage vital signs and the nursing notes.  Pertinent labs & imaging results that were available during my care of the patient were reviewed by me and considered in my medical decision making (see chart for details).  Clinical Course as of 02/08/20 4944  Sat Feb 08, 2020  9675 Patient states that she feels fine.  Blood pressure is 145/90.  O2 sats 95%.  Do not feel she needs further work-up at this time.  Will refer her to the MAB lab clinic. [CH]    Clinical Course User Index [CH] Dametria Tuzzolino, Mayer Masker, MD   MDM Rules/Calculators/A&P                          Patient presents by EMS with concerns for high blood pressure.  There initially called out for disorientation; however, patient is awake, alert, oriented.  She is able to provide history.  She does have a fever of 101.  This could cause some disorientation depending upon how high it was at home.  O2 sats 92 to 94% on room air.  She is in no respiratory distress.  She has no complaints at this time.  Spoke with daughter who was only concerned with her blood pressure being so high by EMS.  Blood pressure here is 162/83.  EKG without acute ischemic or arrhythmic changes.  At this point given that the patient is otherwise asymptomatic, do not feel she needs further work-up.   Doubt hypertensive urgency or emergency.  Will provide Tylenol for fever and monitor.  Question validity of blood pressure reading by EMS given that BP reading here is reassuring.  See clinical course above.  Patient's blood pressure repeat 145/90.  She remains asymptomatic.  EKG is without acute ischemic changes.  Patient was given Tylenol with downtrending of her temperature.  I have repeated a COVID-19 test in order to establish diagnosis in the chart so that she can be contacted by the monoclonal antibody clinic.  She is well within the 10-day range as she tested +2 days ago.  Do not feel she needs emergent monoclonal antibodies at this time.   Final Clinical Impression(s) / ED Diagnoses Final diagnoses:  Primary hypertension  COVID-19    Rx / DC Orders ED Discharge Orders    None       Lilymarie Scroggins, Mayer Masker, MD 02/08/20 [phone]

## 2020-02-08 NOTE — ED Notes (Signed)
Called and spoke to daughter Leotis Shames. She stated that she is on her way to pick up her mom and should be here in about 30 mins.

## 2020-02-08 NOTE — ED Notes (Signed)
Pt O2 dropped to 87% while sleeping. Pt states that she wears 2.5L [place] at night. Pt was placed on 2L [place]. O2 in now 95%. Will continue to monitor.

## 2020-02-08 NOTE — Discharge Instructions (Addendum)
You were seen today with concerns for high blood pressure.  Your blood pressure here slightly elevated but not in a concerning range and you are asymptomatic.  Take your blood pressure medications at home.  Continue to monitor closely for COVID-19 symptoms.  If you develop worsening shortness of breath or chest pain, you should be reevaluated.  You will be contacted by the monoclonal antibody clinic as you are positive for COVID-19.

## 2020-02-09 ENCOUNTER — Telehealth: Payer: Self-pay | Admitting: Nurse Practitioner

## 2020-02-09 NOTE — Telephone Encounter (Signed)
Called to Discuss with patient about Covid symptoms and the use of the monoclonal antibody infusion for those with mild to moderate Covid symptoms and at a high risk of hospitalization.     Pt is qualified for this infusion due to co-morbid conditions and/or a member of an at-risk group.     Patient Active Problem List   Diagnosis Date Noted  . Hypokalemia   . Obesity, Class III, BMI 40-49.9 (morbid obesity) (HCC)   . Chronic pain 01/01/2017  . Asthma 01/01/2017  . Fibromyalgia 01/01/2017  . Cellulitis of right leg 01/01/2017  . Sepsis (HCC) 12/31/2016    Patient declines infusion at this time. States that she is much improved today. Symptoms tier reviewed as well as criteria for ending isolation. Preventative practices reviewed. Patient verbalized understanding.    Patient advised to call back if she decides that she does want to get infusion. Callback number to the infusion center given. Patient advised to go to Urgent care or ED with severe symptoms.

## 2020-02-19 ENCOUNTER — Encounter (HOSPITAL_COMMUNITY): Payer: Self-pay

## 2020-02-19 ENCOUNTER — Other Ambulatory Visit: Payer: Self-pay

## 2020-02-19 ENCOUNTER — Inpatient Hospital Stay (HOSPITAL_COMMUNITY)
Admission: EM | Admit: 2020-02-19 | Discharge: 2020-02-23 | DRG: 177 | Disposition: A | Discharge status: Home Health | Payer: Medicare Other | Attending: Internal Medicine | Admitting: Internal Medicine

## 2020-02-19 ENCOUNTER — Emergency Department (HOSPITAL_COMMUNITY): Payer: Medicare Other

## 2020-02-19 DIAGNOSIS — Z961 Presence of intraocular lens: Secondary | ICD-10-CM | POA: Diagnosis present

## 2020-02-19 DIAGNOSIS — R069 Unspecified abnormalities of breathing: Secondary | ICD-10-CM | POA: Diagnosis not present

## 2020-02-19 DIAGNOSIS — R0602 Shortness of breath: Secondary | ICD-10-CM | POA: Diagnosis not present

## 2020-02-19 DIAGNOSIS — E876 Hypokalemia: Secondary | ICD-10-CM | POA: Diagnosis present

## 2020-02-19 DIAGNOSIS — Z79899 Other long term (current) drug therapy: Secondary | ICD-10-CM

## 2020-02-19 DIAGNOSIS — Z9842 Cataract extraction status, left eye: Secondary | ICD-10-CM | POA: Diagnosis not present

## 2020-02-19 DIAGNOSIS — R52 Pain, unspecified: Secondary | ICD-10-CM | POA: Diagnosis not present

## 2020-02-19 DIAGNOSIS — J9601 Acute respiratory failure with hypoxia: Secondary | ICD-10-CM | POA: Diagnosis present

## 2020-02-19 DIAGNOSIS — M7989 Other specified soft tissue disorders: Secondary | ICD-10-CM | POA: Diagnosis not present

## 2020-02-19 DIAGNOSIS — F419 Anxiety disorder, unspecified: Secondary | ICD-10-CM | POA: Diagnosis present

## 2020-02-19 DIAGNOSIS — J1282 Pneumonia due to coronavirus disease 2019: Secondary | ICD-10-CM | POA: Diagnosis present

## 2020-02-19 DIAGNOSIS — J069 Acute upper respiratory infection, unspecified: Secondary | ICD-10-CM

## 2020-02-19 DIAGNOSIS — R7989 Other specified abnormal findings of blood chemistry: Secondary | ICD-10-CM | POA: Diagnosis not present

## 2020-02-19 DIAGNOSIS — M069 Rheumatoid arthritis, unspecified: Secondary | ICD-10-CM | POA: Diagnosis present

## 2020-02-19 DIAGNOSIS — I82431 Acute embolism and thrombosis of right popliteal vein: Secondary | ICD-10-CM | POA: Diagnosis present

## 2020-02-19 DIAGNOSIS — U071 COVID-19: Principal | ICD-10-CM | POA: Diagnosis present

## 2020-02-19 DIAGNOSIS — H409 Unspecified glaucoma: Secondary | ICD-10-CM | POA: Diagnosis present

## 2020-02-19 DIAGNOSIS — G4733 Obstructive sleep apnea (adult) (pediatric): Secondary | ICD-10-CM | POA: Diagnosis present

## 2020-02-19 DIAGNOSIS — J45909 Unspecified asthma, uncomplicated: Secondary | ICD-10-CM | POA: Diagnosis present

## 2020-02-19 DIAGNOSIS — I10 Essential (primary) hypertension: Secondary | ICD-10-CM | POA: Diagnosis present

## 2020-02-19 DIAGNOSIS — Z9049 Acquired absence of other specified parts of digestive tract: Secondary | ICD-10-CM | POA: Diagnosis not present

## 2020-02-19 DIAGNOSIS — Z6836 Body mass index (BMI) 36.0-36.9, adult: Secondary | ICD-10-CM

## 2020-02-19 DIAGNOSIS — Z9851 Tubal ligation status: Secondary | ICD-10-CM

## 2020-02-19 DIAGNOSIS — J80 Acute respiratory distress syndrome: Secondary | ICD-10-CM | POA: Diagnosis not present

## 2020-02-19 DIAGNOSIS — M797 Fibromyalgia: Secondary | ICD-10-CM | POA: Diagnosis present

## 2020-02-19 DIAGNOSIS — I82441 Acute embolism and thrombosis of right tibial vein: Secondary | ICD-10-CM | POA: Diagnosis not present

## 2020-02-19 DIAGNOSIS — I2699 Other pulmonary embolism without acute cor pulmonale: Secondary | ICD-10-CM | POA: Diagnosis present

## 2020-02-19 DIAGNOSIS — I82890 Acute embolism and thrombosis of other specified veins: Secondary | ICD-10-CM | POA: Diagnosis not present

## 2020-02-19 DIAGNOSIS — R0902 Hypoxemia: Secondary | ICD-10-CM

## 2020-02-19 DIAGNOSIS — Z743 Need for continuous supervision: Secondary | ICD-10-CM | POA: Diagnosis not present

## 2020-02-19 LAB — COMPREHENSIVE METABOLIC PANEL
ALT: 19 U/L (ref 0–44)
AST: 25 U/L (ref 15–41)
Albumin: 3.3 g/dL — ABNORMAL LOW (ref 3.5–5.0)
Alkaline Phosphatase: 102 U/L (ref 38–126)
Anion gap: 15 (ref 5–15)
BUN: 22 mg/dL (ref 8–23)
CO2: 31 mmol/L (ref 22–32)
Calcium: 8.6 mg/dL — ABNORMAL LOW (ref 8.9–10.3)
Chloride: 91 mmol/L — ABNORMAL LOW (ref 98–111)
Creatinine, Ser: 0.64 mg/dL (ref 0.44–1.00)
GFR, Estimated: >60 mL/min (ref >60)
Glucose, Bld: 123 mg/dL — ABNORMAL HIGH (ref 70–99)
Potassium: 2.7 mmol/L — CL (ref 3.5–5.1)
Sodium: 137 mmol/L (ref 135–145)
Total Bilirubin: 1.1 mg/dL (ref 0.3–1.2)
Total Protein: 7.8 g/dL (ref 6.5–8.1)

## 2020-02-19 LAB — CBC WITH DIFFERENTIAL/PLATELET
Abs Immature Granulocytes: 0.11 10*3/uL — ABNORMAL HIGH (ref 0.00–0.07)
Basophils Absolute: 0.1 10*3/uL (ref 0.0–0.1)
Basophils Relative: 1 %
Eosinophils Absolute: 0 10*3/uL (ref 0.0–0.5)
Eosinophils Relative: 0 %
HCT: 40.8 % (ref 36.0–46.0)
Hemoglobin: 13.4 g/dL (ref 12.0–15.0)
Immature Granulocytes: 1 %
Lymphocytes Relative: 8 %
Lymphs Abs: 0.6 10*3/uL — ABNORMAL LOW (ref 0.7–4.0)
MCH: 29.3 pg (ref 26.0–34.0)
MCHC: 32.8 g/dL (ref 30.0–36.0)
MCV: 89.3 fL (ref 80.0–100.0)
Monocytes Absolute: 0.6 10*3/uL (ref 0.1–1.0)
Monocytes Relative: 7 %
Neutro Abs: 6.9 10*3/uL (ref 1.7–7.7)
Neutrophils Relative %: 83 %
Platelets: 317 10*3/uL (ref 150–400)
RBC: 4.57 MIL/uL (ref 3.87–5.11)
RDW: 14.1 % (ref 11.5–15.5)
WBC: 8.2 10*3/uL (ref 4.0–10.5)
nRBC: 0.2 % (ref 0.0–0.2)

## 2020-02-19 LAB — TRIGLYCERIDES: Triglycerides: 348 mg/dL — ABNORMAL HIGH (ref <150)

## 2020-02-19 LAB — D-DIMER, QUANTITATIVE: D-Dimer, Quant: 5.83 ug/mL-FEU — ABNORMAL HIGH (ref 0.00–0.50)

## 2020-02-19 LAB — PROCALCITONIN: Procalcitonin: <0.1 ng/mL

## 2020-02-19 LAB — LACTIC ACID, PLASMA
Lactic Acid, Venous: 1.4 mmol/L (ref 0.5–1.9)
Lactic Acid, Venous: 1.3 mmol/L (ref 0.5–1.9)

## 2020-02-19 LAB — FERRITIN: Ferritin: 73 ng/mL (ref 11–307)

## 2020-02-19 LAB — C-REACTIVE PROTEIN: CRP: 20.5 mg/dL — ABNORMAL HIGH (ref <1.0)

## 2020-02-19 LAB — FIBRINOGEN: Fibrinogen: 495 mg/dL — ABNORMAL HIGH (ref 210–475)

## 2020-02-19 LAB — LACTATE DEHYDROGENASE: LDH: 322 U/L — ABNORMAL HIGH (ref 98–192)

## 2020-02-19 MED ORDER — BUTALBITAL-APAP-CAFFEINE 50-325-40 MG PO TABS
1.0000 | ORAL_TABLET | Freq: Four times a day (QID) | ORAL | Status: DC
  Administered 2020-02-20 – 2020-02-21 (×7): 1 via ORAL
  Filled 2020-02-19 (×7): qty 1

## 2020-02-19 MED ORDER — POTASSIUM CHLORIDE CRYS ER 20 MEQ PO TBCR
20.0000 meq | EXTENDED_RELEASE_TABLET | Freq: Once | ORAL | Status: AC
  Administered 2020-02-20: 20 meq via ORAL
  Filled 2020-02-19: qty 1

## 2020-02-19 MED ORDER — NAPHAZOLINE-PHENIRAMINE 0.025-0.3 % OP SOLN
1.0000 [drp] | Freq: Four times a day (QID) | OPHTHALMIC | Status: DC | PRN
  Filled 2020-02-19: qty 5

## 2020-02-19 MED ORDER — POTASSIUM CHLORIDE 10 MEQ/100ML IV SOLN
10.0000 meq | INTRAVENOUS | Status: AC
  Administered 2020-02-19 (×3): 10 meq via INTRAVENOUS
  Filled 2020-02-19 (×3): qty 100

## 2020-02-19 MED ORDER — SODIUM CHLORIDE 0.9 % IV SOLN
100.0000 mg | Freq: Once | INTRAVENOUS | Status: AC
  Administered 2020-02-19: 100 mg via INTRAVENOUS
  Filled 2020-02-19: qty 20

## 2020-02-19 MED ORDER — SODIUM CHLORIDE 0.9 % IV SOLN
100.0000 mg | Freq: Every day | INTRAVENOUS | Status: AC
  Administered 2020-02-20 – 2020-02-23 (×4): 100 mg via INTRAVENOUS
  Filled 2020-02-19 (×4): qty 20

## 2020-02-19 MED ORDER — ALPRAZOLAM 0.5 MG PO TABS: 0.5000 mg | ORAL_TABLET | Freq: Four times a day (QID) | ORAL | Status: DC | PRN

## 2020-02-19 MED ORDER — SODIUM CHLORIDE 0.9 % IV SOLN: 200.0000 mg | Freq: Once | INTRAVENOUS | Status: DC

## 2020-02-19 MED ORDER — METHYLPREDNISOLONE SODIUM SUCC 125 MG IJ SOLR
1.0000 mg/kg | Freq: Two times a day (BID) | INTRAMUSCULAR | Status: AC
  Administered 2020-02-19 – 2020-02-22 (×6): 98.75 mg via INTRAVENOUS
  Filled 2020-02-19 (×6): qty 2

## 2020-02-19 MED ORDER — DILTIAZEM HCL 60 MG PO TABS
180.0000 mg | ORAL_TABLET | Freq: Every day | ORAL | Status: DC
  Administered 2020-02-20 – 2020-02-23 (×4): 180 mg via ORAL
  Filled 2020-02-19 (×3): qty 3
  Filled 2020-02-19: qty 6

## 2020-02-19 MED ORDER — PREDNISONE 50 MG PO TABS: 50.0000 mg | ORAL_TABLET | Freq: Every day | ORAL | Status: DC

## 2020-02-19 MED ORDER — PREDNISONE 20 MG PO TABS
50.0000 mg | ORAL_TABLET | Freq: Every day | ORAL | Status: DC
  Administered 2020-02-23: 50 mg via ORAL
  Filled 2020-02-19: qty 1

## 2020-02-19 MED ORDER — METHOCARBAMOL 500 MG PO TABS
500.0000 mg | ORAL_TABLET | Freq: Every day | ORAL | Status: DC
  Administered 2020-02-20 – 2020-02-23 (×4): 500 mg via ORAL
  Filled 2020-02-19 (×4): qty 1

## 2020-02-19 MED ORDER — PANTOPRAZOLE SODIUM 40 MG PO TBEC: 40.0000 mg | DELAYED_RELEASE_TABLET | Freq: Every day | ORAL | Status: DC

## 2020-02-19 MED ORDER — GUAIFENESIN-DM 100-10 MG/5ML PO SYRP: 10.0000 mL | ORAL_SOLUTION | ORAL | Status: DC | PRN

## 2020-02-19 MED ORDER — PANTOPRAZOLE SODIUM 40 MG PO TBEC
40.0000 mg | DELAYED_RELEASE_TABLET | Freq: Every day | ORAL | Status: DC
  Administered 2020-02-19 – 2020-02-23 (×5): 40 mg via ORAL
  Filled 2020-02-19 (×5): qty 1

## 2020-02-19 MED ORDER — SODIUM CHLORIDE 0.9 % IV SOLN: 100.0000 mg | Freq: Every day | INTRAVENOUS | Status: DC

## 2020-02-19 MED ORDER — MAGNESIUM SULFATE IN D5W 1-5 GM/100ML-% IV SOLN
1.0000 g | Freq: Once | INTRAVENOUS | Status: AC
  Administered 2020-02-19: 1 g via INTRAVENOUS
  Filled 2020-02-19: qty 100

## 2020-02-19 MED ORDER — POTASSIUM CHLORIDE CRYS ER 20 MEQ PO TBCR
40.0000 meq | EXTENDED_RELEASE_TABLET | Freq: Once | ORAL | Status: AC
  Administered 2020-02-19: 40 meq via ORAL
  Filled 2020-02-19: qty 2

## 2020-02-19 MED ORDER — METHYLPREDNISOLONE SODIUM SUCC 125 MG IJ SOLR: 1.0000 mg/kg | Freq: Two times a day (BID) | INTRAMUSCULAR | Status: DC

## 2020-02-19 MED ORDER — ENOXAPARIN SODIUM 100 MG/ML ~~LOC~~ SOLN
1.0000 mg/kg | Freq: Two times a day (BID) | SUBCUTANEOUS | Status: DC
  Administered 2020-02-19 – 2020-02-21 (×4): 100 mg via SUBCUTANEOUS
  Filled 2020-02-19 (×4): qty 1

## 2020-02-19 MED ORDER — HYDROCOD POLST-CPM POLST ER 10-8 MG/5ML PO SUER
5.0000 mL | Freq: Two times a day (BID) | ORAL | Status: DC | PRN
Start: 2020-02-19 — End: 2020-02-23
  Administered 2020-02-20: 5 mL via ORAL
  Filled 2020-02-19: qty 5

## 2020-02-19 NOTE — ED Triage Notes (Signed)
Pt positive for covid since 12/23 from home test. Seen in ED on 12/25. Pt SOB and coughing. EMS report 81% on RA. Pt wears 2.5 L O2 at bedtime. Pt given solumedrol and albuterol en route

## 2020-02-19 NOTE — ED Notes (Signed)
Patient in bed resting at this time. Patient in no distress at this time also.

## 2020-02-19 NOTE — H&P (Signed)
TRH H&P   Patient Demographics:    [name] [name], is a 70 y.o. female  MRN: [id number]   DOB - 1950-03-23  Admit Date - 02/19/2020  Outpatient Primary MD for the patient is Redmond School, MD  Referring MD/NP/PA: PA Sofia  Patient coming from: Home  Chief Complaint  Patient presents with   Shortness of Breath   Covid Positive      HPI:    [name] [name]  is a 70 y.o. female, with past medical history of asthma, fibromyalgia, hypertension, glaucoma, came to ED due to cough and shortness of breath, patient report she is on 2.5 L oxygen at nighttime, denies any history of COPD or smoking in the past, she is unaware of any diagnosis of OSA, patient report she tested positive for COVID-19 on home test kit 12/23, this was confirmed by PCR on 12/25, reports symptom has been going on for last 2 weeks, but over last couple days she is having worsening cough, shortness of breath, which prompted her to come to ED, denies any recent fever, chills, reports poor appetite, weakness and fatigue, she denies any chest pain. -In ED she was 80% on room air, requiring up to 3 L of oxygen, chest x-ray significant for multifocal pneumonia, D-dimers elevated at 5.8, 20.5, lactic acid normal at 1.4, she had hypokalemia with potassium of 2.7, received IV Solu-Medrol by [facility]  hospitalist consulted to admit.     Review of systems:    In addition to the HPI above, No Fever-chills, she reports generalized weakness and fatigue. No Headache, No changes with Vision or hearing, No problems swallowing food or Liquids, No Chest pain, she reports cough and SOB No Abdominal pain, No Nausea or Vommitting, Bowel movements are regular, No Blood in stool or Urine, No dysuria, No new skin rashes or bruises, No new joints pains-aches,  No new weakness, tingling, numbness in any extremity, No recent weight gain  or loss, No polyuria, polydypsia or polyphagia, No significant Mental Stressors.  A full 10 point Review of Systems was done, except as stated above, all other Review of Systems were negative.   With Past History of the following :    Past Medical History:  Diagnosis Date   Anxiety    Asthma    Ectopic pregnancy    Fibromyalgia    Glaucoma    Hypertension    OSA (obstructive sleep apnea)    o2 at 2.5 liters at night   Rheumatoid arthritis(714.0)       Past Surgical History:  Procedure Laterality Date   BACK SURGERY     CATARACT EXTRACTION     CATARACT EXTRACTION W/PHACO  01/16/2012   Procedure: CATARACT EXTRACTION PHACO AND INTRAOCULAR LENS PLACEMENT ([place]);  Surgeon: Williams Che, MD;  Location: [facility];  Service: Ophthalmology;  Laterality: Right;  CDE: 14.05   CERVICAL LAMINECTOMY  with plating   CHOLECYSTECTOMY     ECTOPIC PREGNANCY SURGERY     KNEE ARTHROSCOPY     TUBAL LIGATION     vericose vein surgery        Social History:     Social History   Tobacco Use   Smoking status: Never Smoker   Smokeless tobacco: Never Used  Substance Use Topics   Alcohol use: No        Family History :   Family history was reviewed, non pertinent   Home Medications:   Prior to Admission medications   Medication Sig Start Date End Date Taking? Authorizing Provider  albuterol (PROVENTIL) 2 MG tablet Take 2 mg by mouth 3 (three) times daily.    [provider]  ALPRAZolam Duanne Moron) 0.5 MG tablet Take 0.5 mg 4 (four) times daily as needed by mouth for anxiety. 12/29/16   [provider]  buprenorphine-naloxone (SUBOXONE) 8-2 MG SUBL Place 1 tablet under the tongue 4 (four) times daily as needed. Pain.    [provider]  butalbital-acetaminophen-caffeine (FIORICET, ESGIC) 50-325-40 MG per tablet Take 1 tablet by mouth 4 (four) times daily.    [provider]  diltiazem (CARDIZEM) 120 MG tablet Take 180 mg by  mouth daily.    [provider]  fexofenadine (ALLEGRA) 180 MG tablet Take 180 mg by mouth daily.    [provider]  Menthol-Methyl Salicylate (GNP MUSCLE RUB EX) Apply 1 application topically daily as needed. Pain.    [provider]  methocarbamol (ROBAXIN) 500 MG tablet Take 500 mg daily by mouth. 12/04/16   [provider]  pantoprazole (PROTONIX) 40 MG tablet Take 1 tablet (40 mg total) daily by mouth. 01/02/17 01/02/18  Barton Dubois, MD  saccharomyces boulardii (FLORASTOR) 250 MG capsule Take 1 capsule (250 mg total) 2 (two) times daily by mouth. 01/02/17   Barton Dubois, MD  Tetrahydrozoline HCl (VISINE OP) Apply 1 drop to eye every morning.    [provider]     Allergies:    No Known Allergies   Physical Exam:   Vitals  Blood pressure (!) 165/89, pulse 92, temperature 99.4 F (37.4 C), temperature source Oral, resp. rate 12, height $RemoveBe'5\' 5"'ArCbjecgP$  (1.651 m), weight 98.9 kg, SpO2 93 %.   1. General well developed female, laying in bed, no apparent distress  2. Normal affect and insight, Not Suicidal or Homicidal, Awake Alert, Oriented X 3.  3. No F.N deficits, ALL C.Nerves Intact, Strength 5/5 all 4 extremities, Sensation intact all 4 extremities, Plantars down going.  4. Ears and Eyes appear Normal, Conjunctivae clear, PERRLA. Moist Oral Mucosa.  5. Supple Neck, No JVD, No cervical lymphadenopathy appriciated, No Carotid Bruits.  6. Symmetrical Chest wall movement, Good air movement bilaterally, CTAB.  7. RRR, No Gallops, Rubs or Murmurs, No Parasternal Heave.  8. Positive Bowel Sounds, Abdomen Soft, No tenderness, No organomegaly appriciated,No rebound -guarding or rigidity.  9.  No Cyanosis, Normal Skin Turgor, No Skin Rash or Bruise.  10. Good muscle tone,  joints appear normal , no effusions, Normal ROM.  11. No Palpable Lymph Nodes in Neck or Axillae    Data Review:    CBC Recent Labs  Lab 02/19/20 1659  WBC 8.2   HGB 13.4  HCT 40.8  PLT 317  MCV 89.3  MCH 29.3  MCHC 32.8  RDW 14.1  LYMPHSABS 0.6*  MONOABS 0.6  EOSABS 0.0  BASOSABS 0.1   ------------------------------------------------------------------------------------------------------------------  Chemistries  Recent  Labs  Lab 02/19/20 1659  NA 137  K 2.7*  CL 91*  CO2 31  GLUCOSE 123*  BUN 22  CREATININE 0.64  CALCIUM 8.6*  AST 25  ALT 19  ALKPHOS 102  BILITOT 1.1   ------------------------------------------------------------------------------------------------------------------ estimated creatinine clearance is 75.1 mL/min (by C-G formula based on SCr of 0.64 mg/dL). ------------------------------------------------------------------------------------------------------------------ No results for input(s): TSH, T4TOTAL, T3FREE, THYROIDAB in the last 72 hours.  Invalid input(s): FREET3  Coagulation profile No results for input(s): INR, PROTIME in the last 168 hours. ------------------------------------------------------------------------------------------------------------------- Recent Labs    02/19/20 1659  DDIMER 5.83*   -------------------------------------------------------------------------------------------------------------------  Cardiac Enzymes No results for input(s): CKMB, TROPONINI, MYOGLOBIN in the last 168 hours.  Invalid input(s): CK ------------------------------------------------------------------------------------------------------------------ No results found for: BNP   ---------------------------------------------------------------------------------------------------------------  Urinalysis    Component Value Date/Time   COLORURINE YELLOW 12/31/2016 [place] 12/31/2016 1051   LABSPEC 1.013 12/31/2016 1051   PHURINE 8.0 12/31/2016 1051   GLUCOSEU NEGATIVE 12/31/2016 1051   HGBUR NEGATIVE 12/31/2016 1051   BILIRUBINUR NEGATIVE 12/31/2016 1051   KETONESUR NEGATIVE  12/31/2016 1051   PROTEINUR 30 (A) 12/31/2016 1051   UROBILINOGEN 0.2 06/11/2010 1149   NITRITE NEGATIVE 12/31/2016 1051   LEUKOCYTESUR NEGATIVE 12/31/2016 1051    ----------------------------------------------------------------------------------------------------------------   Imaging Results:    DG Chest Portable 1 View  Result Date: 02/19/2020 CLINICAL DATA:  Shortness of breath, COVID-19 positivity EXAM: PORTABLE CHEST 1 VIEW COMPARISON:  12/31/2016 FINDINGS: Cardiac shadow is enlarged but stable. Lungs are well aerated bilaterally. Diffuse bilateral airspace opacities are noted consistent with the given clinical history of COVID-19 positivity. Postsurgical changes in the cervical spine are seen. No effusion is seen. IMPRESSION: Patchy airspace opacities bilaterally consistent with the given clinical history. Electronically Signed   By: Inez Catalina M.D.   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 abnormality Confirmed By: Collie Siad personal review of EKG: Rhythm NSR, Rate  106 /min, QTc 477 ,    Assessment & Plan:    Active Problems:   Hypokalemia   Obesity, Class III, BMI 40-49.9 (morbid obesity) (HCC)   Acute respiratory disease due to COVID-19 virus  Acute hypoxic respiratory failure due to COVID-19 of pneumonia -Patient is unvaccinated -Chest x-ray significant for multifocal pneumonia, she is on 4 L nasal cannula currently -Will be started on IV Solu-Medrol -We will start on remdesivir She was encouraged to use incentive spirometry, flutter valve, out of bed to chair -Continue to trend inflammatory markers closely, CRP and D-dimer significantly elevated, please see discussion below under D-dimers -Discussed baricitinib with the patient, explained risks and benefits, patient denies any history of malignancy, TB, hepatitis B or C, she does report history of diverticulitis few years ago, but nothing recent,  denies any history of immunotherapy, she is agreeable to baricitinib if indicated.  Elevated D-dimers -Most likely in the setting of COVID-19 infection, but significantly elevated at 5.8, will keep on full dose prophylaxis till VTE is ruled out, will start with venous Dopplers  Hypokalemia -Repleted, recheck in a.m.  Hypertension - Continue with Cardizem  Anxiety -Continue with Xanax  History of opioid addiction -Resume Suboxone after medication reconciliation  DVT Prophylaxis   Lovenox   AM Labs Ordered, also please review Full Orders  Family Communication: Admission, patients condition and plan of care including tests being ordered have been discussed with the patient  who indicate understanding and agree with the plan and Code Status.  Code Status Full  Likely DC to  home  Condition GUARDED    Consults called: none    Admission status: inpatient    Time spent in minutes : 60 minutes   Phillips Climes M.D on 02/19/2020 at 6:44 PM   [facility] - Office  [phone]

## 2020-02-19 NOTE — ED Notes (Signed)
Found patient out of bed urinating in the trash can. Patient has pure wick in place. Patient had taken oxygen off Sat 80 percent on room air. Redirected patient back to bed. RN Herbert Seta made aware.

## 2020-02-19 NOTE — ED Notes (Signed)
Assisted to bedside commode. Pt had small amount of diarrhea stool.

## 2020-02-19 NOTE — Progress Notes (Signed)
Pharmacy Antibiotic Note  [name] [name] is a 70 y.o. female admitted on 02/19/2020 with SOB and COVID; pt on home oxygen.  Pharmacy has been consulted for remdesivir dosing.  WBC 8.2, Tmax 99.4, AST/ALT 25/19, d-dimer 5.83, fibrinogen 495, CRP 20.5, LA 1.4, PCT <0.10, ferritin 73, LDH 322  Plan: Remdesivir 200 mg  IV X 1, followed by 100 mg IV daily X 4 days Monitor LFTs daily while on remdesivir  Height: 5\' 5"  (165.1 cm) Weight: 98.9 kg (218 lb 0.6 oz) IBW/kg (Calculated) : 57  Temp (24hrs), Avg:99.4 F (37.4 C), Min:99.4 F (37.4 C), Max:99.4 F (37.4 C)  Recent Labs  Lab 02/19/20 1659  WBC 8.2  CREATININE 0.64  LATICACIDVEN 1.4    Estimated Creatinine Clearance: 75.1 mL/min (by C-G formula based on SCr of 0.64 mg/dL).    No Known Allergies  Microbiology results: 1/5 BCx  2: pending 1/5 COVID: positive 1/5 flu A, flu B: negative  Thank you for allowing pharmacy to be a part of this patient's care.  04/18/20, PharmD, BCPS, [facility] Clinical Pharmacist 02/19/2020 6:52 PM

## 2020-02-19 NOTE — ED Provider Notes (Signed)
[facility] EMERGENCY DEPARTMENT Provider Note   CSN: [id number] Arrival date & time: 02/19/20  3976     History Chief Complaint  Patient presents with  . Shortness of Breath  . Covid Positive    [name] [name] is a 70 y.o. female.  The history is provided by the patient and the EMS personnel. No language interpreter was used.  Shortness of Breath Severity:  Moderate Onset quality:  Gradual Duration:  2 days Timing:  Constant Progression:  Worsening Chronicity:  New Relieved by:  Nothing Worsened by:  Nothing Ineffective treatments:  None tried Associated symptoms: cough   Pt had a positive home covid test on 12/23 after covid exposurePt had a test here on 12/25 after developing a cough.  Pt reports she is short of breath.  Pt is on 02 at night for sleep apnea.  Pt's 02 80 on room air. Pt was given albuterol neb on the way here.  EMs gave pt solumedrol     Past Medical History:  Diagnosis Date  . Anxiety   . Asthma   . Ectopic pregnancy   . Fibromyalgia   . Glaucoma   . Hypertension   . OSA (obstructive sleep apnea)    o2 at 2.5 liters at night  . Rheumatoid arthritis(714.0)     Patient Active Problem List   Diagnosis Date Noted  . Hypokalemia   . Obesity, Class III, BMI 40-49.9 (morbid obesity) (HCC)   . Chronic pain 01/01/2017  . Asthma 01/01/2017  . Fibromyalgia 01/01/2017  . Cellulitis of right leg 01/01/2017  . Sepsis (HCC) 12/31/2016    Past Surgical History:  Procedure Laterality Date  . BACK SURGERY    . CATARACT EXTRACTION    . CATARACT EXTRACTION W/PHACO  01/16/2012   Procedure: CATARACT EXTRACTION PHACO AND INTRAOCULAR LENS PLACEMENT ([facility]);  Surgeon: Susa Simmonds, MD;  Location: [facility];  Service: Ophthalmology;  Laterality: Right;  CDE: 14.05  . CERVICAL LAMINECTOMY     with plating  . CHOLECYSTECTOMY    . ECTOPIC PREGNANCY SURGERY    . KNEE ARTHROSCOPY    . TUBAL LIGATION    . vericose vein surgery       OB History   No obstetric  history on file.     No family history on file.  Social History   Tobacco Use  . Smoking status: Never Smoker  . Smokeless tobacco: Never Used  Substance Use Topics  . Alcohol use: No  . Drug use: No    Home Medications Prior to Admission medications   Medication Sig Start Date End Date Taking? Authorizing Provider  albuterol (PROVENTIL) 2 MG tablet Take 2 mg by mouth 3 (three) times daily.    [provider]  ALPRAZolam Prudy Feeler) 0.5 MG tablet Take 0.5 mg 4 (four) times daily as needed by mouth for anxiety. 12/29/16   [provider]  buprenorphine-naloxone (SUBOXONE) 8-2 MG SUBL Place 1 tablet under the tongue 4 (four) times daily as needed. Pain.    [provider]  butalbital-acetaminophen-caffeine (FIORICET, ESGIC) 50-325-40 MG per tablet Take 1 tablet by mouth 4 (four) times daily.    [provider]  diltiazem (CARDIZEM) 120 MG tablet Take 180 mg by mouth daily.    [provider]  fexofenadine (ALLEGRA) 180 MG tablet Take 180 mg by mouth daily.    [provider]  Menthol-Methyl Salicylate (GNP MUSCLE RUB EX) Apply 1 application topically daily as needed. Pain.    [provider]  methocarbamol (ROBAXIN) 500 MG tablet Take 500 mg daily by mouth. 12/04/16   [provider]  pantoprazole (PROTONIX) 40 MG tablet Take 1 tablet (40 mg total) daily by mouth. 01/02/17 01/02/18  [name] Dubois, MD  saccharomyces boulardii (FLORASTOR) 250 MG capsule Take 1 capsule (250 mg total) 2 (two) times daily by mouth. 01/02/17   [name] Dubois, MD  Tetrahydrozoline HCl (VISINE OP) Apply 1 drop to eye every morning.    [provider]    Allergies    Patient has no known allergies.  Review of Systems   Review of Systems  Respiratory: Positive for cough and shortness of breath.   All other systems reviewed and are negative.   Physical Exam Updated Vital Signs BP (!) 165/89 (BP Location: Right Arm)   Pulse 92    Temp 99.4 F (37.4 C) (Oral)   Resp 12   Ht 5\' 5"  (1.651 m)   Wt 98.9 kg   SpO2 93%   BMI 36.28 kg/m   Physical Exam Vitals and nursing note reviewed.  Constitutional:      Appearance: She is well-developed and well-nourished.  HENT:     Head: Normocephalic.  Eyes:     Extraocular Movements: EOM normal.  Cardiovascular:     Rate and Rhythm: Normal rate.  Pulmonary:     Effort: Pulmonary effort is normal.  Abdominal:     General: There is no distension.  Musculoskeletal:        General: Normal range of motion.     Cervical back: Normal range of motion.  Skin:    General: Skin is warm.  Neurological:     General: No focal deficit present.     Mental Status: She is alert and oriented to person, place, and time.  Psychiatric:        Mood and Affect: Mood and affect and mood normal.     ED Results / Procedures / Treatments   Labs (all labs ordered are listed, but only abnormal results are displayed) Labs Reviewed  CBC WITH DIFFERENTIAL/PLATELET - Abnormal; Notable for the following components:      Result Value   Lymphs Abs 0.6 (*)    Abs Immature Granulocytes 0.11 (*)    All other components within normal limits  COMPREHENSIVE METABOLIC PANEL - Abnormal; Notable for the following components:   Potassium 2.7 (*)    Chloride 91 (*)    Glucose, Bld 123 (*)    Calcium 8.6 (*)    Albumin 3.3 (*)    All other components within normal limits  D-DIMER, QUANTITATIVE (NOT AT [facility]) - Abnormal; Notable for the following components:   D-Dimer, Quant 5.83 (*)    All other components within normal limits  LACTATE DEHYDROGENASE - Abnormal; Notable for the following components:   LDH 322 (*)    All other components within normal limits  TRIGLYCERIDES - Abnormal; Notable for the following components:   Triglycerides 348 (*)    All other components within normal limits  FIBRINOGEN - Abnormal; Notable for the following components:   Fibrinogen 495 (*)    All other components  within normal limits  C-REACTIVE PROTEIN - Abnormal; Notable for the following components:   CRP 20.5 (*)    All other components within normal limits  CULTURE, BLOOD (ROUTINE X 2)  CULTURE, BLOOD (ROUTINE X 2)  LACTIC ACID, PLASMA  PROCALCITONIN  FERRITIN  LACTIC ACID, PLASMA    EKG None  Radiology DG Chest Portable  1 View  Result Date: 02/19/2020 CLINICAL DATA:  Shortness of breath, COVID-19 positivity EXAM: PORTABLE CHEST 1 VIEW COMPARISON:  12/31/2016 FINDINGS: Cardiac shadow is enlarged but stable. Lungs are well aerated bilaterally. Diffuse bilateral airspace opacities are noted consistent with the given clinical history of COVID-19 positivity. Postsurgical changes in the cervical spine are seen. No effusion is seen. IMPRESSION: Patchy airspace opacities bilaterally consistent with the given clinical history. Electronically Signed   By: Alcide Clever M.D.   On: 02/19/2020 16:54    Procedures Procedures (including critical care time)  Medications Ordered in ED Medications - No data to display  ED Course  I have reviewed the triage vital signs and the nursing notes.  Pertinent labs & imaging results that were available during my care of the patient were reviewed by me and considered in my medical decision making (see chart for details).    MDM Rules/Calculators/A&P                          MDM:  Chest xray shows viral pneumonia. Final Clinical Impression(s) / ED Diagnoses Final diagnoses:  Hypoxia  Pneumonia due to COVID-19 virus  Hypokalemia    Rx / DC Orders ED Discharge Orders    None       Osie Cheeks 02/19/20 1842    Cathren Laine, MD 02/19/20 [phone]

## 2020-02-19 NOTE — ED Notes (Signed)
Date and time results received: 02/19/20 1806   Test: Potassium Critical Value: 2.7  Name of Provider Notified: Steinl  Orders Received? Or Actions Taken?: Orders Received - See Orders for details

## 2020-02-20 ENCOUNTER — Inpatient Hospital Stay (HOSPITAL_COMMUNITY): Payer: Medicare Other

## 2020-02-20 DIAGNOSIS — I2699 Other pulmonary embolism without acute cor pulmonale: Secondary | ICD-10-CM | POA: Diagnosis present

## 2020-02-20 DIAGNOSIS — J069 Acute upper respiratory infection, unspecified: Secondary | ICD-10-CM | POA: Diagnosis not present

## 2020-02-20 DIAGNOSIS — J9601 Acute respiratory failure with hypoxia: Secondary | ICD-10-CM | POA: Diagnosis present

## 2020-02-20 DIAGNOSIS — I82431 Acute embolism and thrombosis of right popliteal vein: Secondary | ICD-10-CM

## 2020-02-20 DIAGNOSIS — U071 COVID-19: Secondary | ICD-10-CM | POA: Diagnosis not present

## 2020-02-20 DIAGNOSIS — E876 Hypokalemia: Secondary | ICD-10-CM | POA: Diagnosis not present

## 2020-02-20 DIAGNOSIS — J1282 Pneumonia due to coronavirus disease 2019: Secondary | ICD-10-CM

## 2020-02-20 LAB — COMPREHENSIVE METABOLIC PANEL
ALT: 16 U/L (ref 0–44)
AST: 20 U/L (ref 15–41)
Albumin: 2.9 g/dL — ABNORMAL LOW (ref 3.5–5.0)
Alkaline Phosphatase: 82 U/L (ref 38–126)
Anion gap: 14 (ref 5–15)
BUN: 13 mg/dL (ref 8–23)
CO2: 31 mmol/L (ref 22–32)
Calcium: 8.6 mg/dL — ABNORMAL LOW (ref 8.9–10.3)
Chloride: 98 mmol/L (ref 98–111)
Creatinine, Ser: 0.5 mg/dL (ref 0.44–1.00)
GFR, Estimated: >60 mL/min (ref >60)
Glucose, Bld: 173 mg/dL — ABNORMAL HIGH (ref 70–99)
Potassium: 3.7 mmol/L (ref 3.5–5.1)
Sodium: 143 mmol/L (ref 135–145)
Total Bilirubin: 0.7 mg/dL (ref 0.3–1.2)
Total Protein: 7.3 g/dL (ref 6.5–8.1)

## 2020-02-20 LAB — CBC WITH DIFFERENTIAL/PLATELET
Abs Immature Granulocytes: 0.07 10*3/uL (ref 0.00–0.07)
Basophils Absolute: 0 10*3/uL (ref 0.0–0.1)
Basophils Relative: 0 %
Eosinophils Absolute: 0 10*3/uL (ref 0.0–0.5)
Eosinophils Relative: 0 %
HCT: 42.8 % (ref 36.0–46.0)
Hemoglobin: 13.6 g/dL (ref 12.0–15.0)
Immature Granulocytes: 1 %
Lymphocytes Relative: 11 %
Lymphs Abs: 0.7 10*3/uL (ref 0.7–4.0)
MCH: 28.6 pg (ref 26.0–34.0)
MCHC: 31.8 g/dL (ref 30.0–36.0)
MCV: 90.1 fL (ref 80.0–100.0)
Monocytes Absolute: 0.2 10*3/uL (ref 0.1–1.0)
Monocytes Relative: 4 %
Neutro Abs: 5.1 10*3/uL (ref 1.7–7.7)
Neutrophils Relative %: 84 %
Platelets: 315 10*3/uL (ref 150–400)
RBC: 4.75 MIL/uL (ref 3.87–5.11)
RDW: 14.3 % (ref 11.5–15.5)
WBC: 6.1 10*3/uL (ref 4.0–10.5)
nRBC: 0.3 % — ABNORMAL HIGH (ref 0.0–0.2)

## 2020-02-20 LAB — C-REACTIVE PROTEIN: CRP: 22.2 mg/dL — ABNORMAL HIGH (ref <1.0)

## 2020-02-20 LAB — D-DIMER, QUANTITATIVE: D-Dimer, Quant: 6.62 ug/mL-FEU — ABNORMAL HIGH (ref 0.00–0.50)

## 2020-02-20 MED ORDER — IOHEXOL 350 MG/ML SOLN
100.0000 mL | Freq: Once | INTRAVENOUS | Status: AC | PRN
  Administered 2020-02-20: 100 mL via INTRAVENOUS

## 2020-02-20 MED ORDER — BARICITINIB 2 MG PO TABS
4.0000 mg | ORAL_TABLET | Freq: Every day | ORAL | Status: DC
  Administered 2020-02-20 – 2020-02-23 (×4): 4 mg via ORAL
  Filled 2020-02-20 (×3): qty 2

## 2020-02-20 NOTE — Progress Notes (Signed)
PROGRESS NOTE    [name] [name]  YIR:[id number] DOB: Jan 25, 1951 DOA: 02/19/2020 PCP: [name], MD    Brief Narrative:  70 y/o female admitted to the hospital with shortness of breath. She was found to have acute resp failure with hypoxia secondary to covid 19 pneumonia. Currently on remdesivir, steroids and baracitinib. She was also noted to have RLE DVT and has been started on anticoagulation   Assessment & Plan:   Active Problems:   Hypokalemia   Obesity, Class III, BMI 40-49.9 (morbid obesity) (HCC)   Acute respiratory disease due to COVID-19 virus   Acute respiratory failure with hypoxia -secondary to covid 19 pneumonia -currently on HFNC at 15L -continue to wean down oxygen as tolerated  Covid 19 pneumonia -currently on remdesivir and steroids -within rising CRP and increasing oxygen requirements, she will be started on baricitinib -continue pulmonary hygiene  Acute RLE DVT -noted to have elevated D dimer -precipitated by covid -on full dose lovenox -check CT chest to evaluate for PE  Hypokalemia -replaced  HTN -BP stable on cardizem  Anxiety -continue on xanax  History of opioid addiction -continue on suboxone  DVT prophylaxis: therapeutic lovenox  Code Status: full code Family Communication: unable to reach husband and daughter over the phone, left message 1/6 Disposition Plan: Status is: Inpatient  Remains inpatient appropriate because:Inpatient level of care appropriate due to severity of illness   Dispo: The patient is from: Home              Anticipated d/c is to: TBD              Anticipated d/c date is: 2 days              Patient currently is not medically stable to d/c.    Consultants:     Procedures:     Antimicrobials:       Subjective: Denies any shortness of breath. She does have a productive cough  Objective: Vitals:   02/20/20 0500 02/20/20 0530 02/20/20 0600 02/20/20 0800  BP: (!) 162/94 (!) 146/94 (!)  165/103 (!) 142/85  Pulse: 91 86 88 88  Resp:   (!) 22 (!) 26  Temp:      TempSrc:      SpO2: 95% 92% 92% 90%  Weight:      Height:        Intake/Output Summary (Last 24 hours) at 02/20/2020 1102 Last data filed at 02/20/2020 0035 Gross per 24 hour  Intake 531.19 ml  Output 900 ml  Net -368.81 ml   Filed Weights   02/19/20 1623  Weight: 98.9 kg    Examination:  General exam: Appears calm and comfortable  Respiratory system: Clear to auscultation. Respiratory effort normal. Cardiovascular system: S1 & S2 heard, RRR. No JVD, murmurs, rubs, gallops or clicks. No pedal edema. Gastrointestinal system: Abdomen is nondistended, soft and nontender. No organomegaly or masses felt. Normal bowel sounds heard. Central nervous system: Alert and oriented. No focal neurological deficits. Extremities: Symmetric 5 x 5 power. Skin: No rashes, lesions or ulcers Psychiatry: Judgement and insight appear normal. Mood & affect appropriate.     Data Reviewed: I have personally reviewed following labs and imaging studies  CBC: Recent Labs  Lab 02/19/20 1659 02/20/20 0619  WBC 8.2 6.1  NEUTROABS 6.9 5.1  HGB 13.4 13.6  HCT 40.8 42.8  MCV 89.3 90.1  PLT 317 315   Basic Metabolic Panel: Recent Labs  Lab 02/19/20 1659 02/20/20 0619  NA  137 143  K 2.7* 3.7  CL 91* 98  CO2 31 31  GLUCOSE 123* 173*  BUN 22 13  CREATININE 0.64 0.50  CALCIUM 8.6* 8.6*   GFR: Estimated Creatinine Clearance: 75.1 mL/min (by C-G formula based on SCr of 0.5 mg/dL). Liver Function Tests: Recent Labs  Lab 02/19/20 1659 02/20/20 0619  AST 25 20  ALT 19 16  ALKPHOS 102 82  BILITOT 1.1 0.7  PROT 7.8 7.3  ALBUMIN 3.3* 2.9*   No results for input(s): LIPASE, AMYLASE in the last 168 hours. No results for input(s): AMMONIA in the last 168 hours. Coagulation Profile: No results for input(s): INR, PROTIME in the last 168 hours. Cardiac Enzymes: No results for input(s): CKTOTAL, CKMB, CKMBINDEX, TROPONINI  in the last 168 hours. BNP (last 3 results) No results for input(s): PROBNP in the last 8760 hours. HbA1C: No results for input(s): HGBA1C in the last 72 hours. CBG: No results for input(s): GLUCAP in the last 168 hours. Lipid Profile: Recent Labs    02/19/20 1659  TRIG 348*   Thyroid Function Tests: No results for input(s): TSH, T4TOTAL, FREET4, T3FREE, THYROIDAB in the last 72 hours. Anemia Panel: Recent Labs    02/19/20 1659  FERRITIN 73   Sepsis Labs: Recent Labs  Lab 02/19/20 1659 02/19/20 2006  PROCALCITON <0.10  --   LATICACIDVEN 1.4 1.3    Recent Results (from the past 240 hour(s))  Blood Culture (routine x 2)     Status: None (Preliminary result)   Collection Time: 02/19/20  4:59 PM   Specimen: BLOOD  Result Value Ref Range Status   Specimen Description BLOOD LEFT ANTECUBITAL  Final   Special Requests   Final    BOTTLES DRAWN AEROBIC AND ANAEROBIC Blood Culture results may not be optimal due to an inadequate volume of blood received in culture bottles   Culture   Final    NO GROWTH < 24 HOURS Performed at [facility], [address], [place], [place] [address]    Report Status PENDING  Incomplete  Blood Culture (routine x 2)     Status: None (Preliminary result)   Collection Time: 02/19/20  4:59 PM   Specimen: BLOOD LEFT WRIST  Result Value Ref Range Status   Specimen Description BLOOD LEFT WRIST  Final   Special Requests   Final    BOTTLES DRAWN AEROBIC AND ANAEROBIC Blood Culture results may not be optimal due to an inadequate volume of blood received in culture bottles   Culture   Final    NO GROWTH < 24 HOURS Performed at [facility], [address], [place], [place] [address]    Report Status PENDING  Incomplete         Radiology Studies: US Venous Img Lower Bilateral (DVT)  Result Date: 02/20/2020 CLINICAL DATA:  Positive D-dimer and right leg swelling EXAM: RIGHT LOWER EXTREMITY VENOUS DOPPLER ULTRASOUND TECHNIQUE: Gray-scale sonography  with graded compression, as well as color Doppler and duplex ultrasound were performed to evaluate the lower extremity deep venous systems from the level of the common femoral vein and including the common femoral, femoral, profunda femoral, popliteal and calf veins including the posterior tibial, peroneal and gastrocnemius veins when visible. The superficial great saphenous vein was also interrogated. Spectral Doppler was utilized to evaluate flow at rest and with distal augmentation maneuvers in the common femoral, femoral and popliteal veins. COMPARISON:  None. FINDINGS: Contralateral Common Femoral Vein: Respiratory phasicity is normal and symmetric with the symptomatic side.  No evidence of thrombus. Normal compressibility. Common Femoral Vein: No evidence of thrombus. Normal compressibility, respiratory phasicity and response to augmentation. Saphenofemoral Junction: No evidence of thrombus. Normal compressibility and flow on color Doppler imaging. Profunda Femoral Vein: No evidence of thrombus. Normal compressibility and flow on color Doppler imaging. Femoral Vein: No evidence of thrombus. Normal compressibility, respiratory phasicity and response to augmentation. Popliteal Vein: Thrombus is noted with decreased compressibility. Calf Veins: Thrombus is noted with decreased compressibility in the posterior tibial and peroneal veins. Anterior tibial vein also demonstrates similar thrombus. Superficial Great Saphenous Vein: No evidence of thrombus. Normal compressibility. Venous Reflux:  None. Other Findings:  None. IMPRESSION: Diffuse thrombus throughout the right popliteal and infrapopliteal veins. Electronically Signed   By: Inez Catalina M.D.   On: 02/20/2020 10:31   DG Chest Portable 1 View  Result Date: 02/19/2020 CLINICAL DATA:  Shortness of breath, COVID-19 positivity EXAM: PORTABLE CHEST 1 VIEW COMPARISON:  12/31/2016 FINDINGS: Cardiac shadow is enlarged but stable. Lungs are well aerated bilaterally.  Diffuse bilateral airspace opacities are noted consistent with the given clinical history of COVID-19 positivity. Postsurgical changes in the cervical spine are seen. No effusion is seen. IMPRESSION: Patchy airspace opacities bilaterally consistent with the given clinical history. Electronically Signed   By: Inez Catalina M.D.   On: 02/19/2020 16:54        Scheduled Meds: . baricitinib  4 mg Oral Daily  . butalbital-acetaminophen-caffeine  1 tablet Oral QID  . diltiazem  180 mg Oral Daily  . enoxaparin (LOVENOX) injection  1 mg/kg Subcutaneous Q12H  . methocarbamol  500 mg Oral Daily  . methylPREDNISolone (SOLU-MEDROL) injection  1 mg/kg Intravenous Q12H   Followed by  . [START ON 02/23/2020] predniSONE  50 mg Oral Daily  . pantoprazole  40 mg Oral Daily   Continuous Infusions: . remdesivir 100 mg in NS 100 mL 100 mg (02/20/20 0958)     LOS: 1 day    Time spent: 71mins    Kathie Dike, MD [facility]   If 7PM-7AM, please contact night-coverage [email or web address]  02/20/2020, 11:02 AM

## 2020-02-20 NOTE — Plan of Care (Signed)

## 2020-02-20 NOTE — ED Notes (Signed)
Patient resting well at this time. Vitals are within normal limits.

## 2020-02-20 NOTE — ED Notes (Signed)
Patient resting at this time. Patient had taken her oxygen out of her nose Sat 87.  O2 placed back sat 93. Suction canister emptied at this time.

## 2020-02-20 NOTE — ED Notes (Signed)
Oxygen sats varying from 88-92%, HFNC @ 15L applied.

## 2020-02-21 DIAGNOSIS — I82431 Acute embolism and thrombosis of right popliteal vein: Secondary | ICD-10-CM | POA: Diagnosis not present

## 2020-02-21 DIAGNOSIS — E876 Hypokalemia: Secondary | ICD-10-CM | POA: Diagnosis not present

## 2020-02-21 DIAGNOSIS — U071 COVID-19: Secondary | ICD-10-CM | POA: Diagnosis not present

## 2020-02-21 DIAGNOSIS — J069 Acute upper respiratory infection, unspecified: Secondary | ICD-10-CM | POA: Diagnosis not present

## 2020-02-21 LAB — CBC WITH DIFFERENTIAL/PLATELET
Abs Immature Granulocytes: 0.12 10*3/uL — ABNORMAL HIGH (ref 0.00–0.07)
Basophils Absolute: 0 10*3/uL (ref 0.0–0.1)
Basophils Relative: 0 %
Eosinophils Absolute: 0 10*3/uL (ref 0.0–0.5)
Eosinophils Relative: 0 %
HCT: 42.6 % (ref 36.0–46.0)
Hemoglobin: 13.7 g/dL (ref 12.0–15.0)
Immature Granulocytes: 1 %
Lymphocytes Relative: 10 %
Lymphs Abs: 0.9 10*3/uL (ref 0.7–4.0)
MCH: 29.3 pg (ref 26.0–34.0)
MCHC: 32.2 g/dL (ref 30.0–36.0)
MCV: 91 fL (ref 80.0–100.0)
Monocytes Absolute: 0.6 10*3/uL (ref 0.1–1.0)
Monocytes Relative: 7 %
Neutro Abs: 7.1 10*3/uL (ref 1.7–7.7)
Neutrophils Relative %: 82 %
Platelets: 399 10*3/uL (ref 150–400)
RBC: 4.68 MIL/uL (ref 3.87–5.11)
RDW: 14.8 % (ref 11.5–15.5)
WBC: 8.7 10*3/uL (ref 4.0–10.5)
nRBC: 0 % (ref 0.0–0.2)

## 2020-02-21 LAB — COMPREHENSIVE METABOLIC PANEL
ALT: 32 U/L (ref 0–44)
AST: 44 U/L — ABNORMAL HIGH (ref 15–41)
Albumin: 2.9 g/dL — ABNORMAL LOW (ref 3.5–5.0)
Alkaline Phosphatase: 78 U/L (ref 38–126)
Anion gap: 12 (ref 5–15)
BUN: 23 mg/dL (ref 8–23)
CO2: 31 mmol/L (ref 22–32)
Calcium: 8.8 mg/dL — ABNORMAL LOW (ref 8.9–10.3)
Chloride: 101 mmol/L (ref 98–111)
Creatinine, Ser: 0.52 mg/dL (ref 0.44–1.00)
GFR, Estimated: >60 mL/min (ref >60)
Glucose, Bld: 172 mg/dL — ABNORMAL HIGH (ref 70–99)
Potassium: 3.3 mmol/L — ABNORMAL LOW (ref 3.5–5.1)
Sodium: 144 mmol/L (ref 135–145)
Total Bilirubin: 0.6 mg/dL (ref 0.3–1.2)
Total Protein: 7.1 g/dL (ref 6.5–8.1)

## 2020-02-21 LAB — D-DIMER, QUANTITATIVE: D-Dimer, Quant: 5.67 ug/mL-FEU — ABNORMAL HIGH (ref 0.00–0.50)

## 2020-02-21 LAB — C-REACTIVE PROTEIN: CRP: 10.4 mg/dL — ABNORMAL HIGH (ref <1.0)

## 2020-02-21 MED ORDER — APIXABAN 5 MG PO TABS: 5.0000 mg | ORAL_TABLET | Freq: Two times a day (BID) | ORAL | Status: DC

## 2020-02-21 MED ORDER — APIXABAN 5 MG PO TABS
10.0000 mg | ORAL_TABLET | Freq: Two times a day (BID) | ORAL | Status: DC
  Administered 2020-02-21 – 2020-02-23 (×5): 10 mg via ORAL
  Filled 2020-02-21 (×5): qty 2

## 2020-02-21 MED ORDER — POLYVINYL ALCOHOL 1.4 % OP SOLN
1.0000 [drp] | OPHTHALMIC | Status: DC | PRN
  Filled 2020-02-21: qty 15

## 2020-02-21 MED ORDER — POTASSIUM CHLORIDE CRYS ER 20 MEQ PO TBCR
40.0000 meq | EXTENDED_RELEASE_TABLET | Freq: Once | ORAL | Status: AC
  Administered 2020-02-21: 40 meq via ORAL
  Filled 2020-02-21: qty 2

## 2020-02-21 MED ORDER — BUTALBITAL-APAP-CAFFEINE 50-325-40 MG PO TABS
2.0000 | ORAL_TABLET | Freq: Four times a day (QID) | ORAL | Status: DC | PRN
  Administered 2020-02-21 – 2020-02-23 (×4): 2 via ORAL
  Filled 2020-02-21 (×4): qty 2

## 2020-02-21 NOTE — Care Management Important Message (Deleted)
Important Message  Patient Details  Name: [name] [name] MRN: [id number] Date of Birth: 11/16/1950   Medicare Important Message Given:  Yes     Corey Harold 02/21/2020, 12:54 PM

## 2020-02-21 NOTE — Progress Notes (Signed)
PROGRESS NOTE    [name] [name]  LOV:[id number] DOB: 1951/01/19 DOA: 02/19/2020 PCP: Redmond School, MD    Brief Narrative:  70 y/o female admitted to the hospital with shortness of breath. She was found to have acute resp failure with hypoxia secondary to covid 19 pneumonia. Currently on remdesivir, steroids and baracitinib. She was also noted to have RLE DVT and has been started on anticoagulation   Assessment & Plan:   Active Problems:   Hypokalemia   Obesity, Class III, BMI 40-49.9 (morbid obesity) (HCC)   Acute respiratory disease due to COVID-19 virus   Acute deep vein thrombosis (DVT) of popliteal vein of right lower extremity (HCC)   Acute pulmonary embolus (HCC)   Pneumonia due to COVID-19 virus   Acute respiratory failure with hypoxia (HCC)   Acute respiratory failure with hypoxia -secondary to covid 19 pneumonia -currently on HFNC at 15L -continue to wean down oxygen as tolerated  Covid 19 pneumonia -currently on remdesivir and steroids -She is also on baricitinib -continue pulmonary hygiene  Acute RLE DVT and acute pulmonary embolus -noted to have elevated D dimer -Venous Dopplers confirmed acute DVT -precipitated by covid -on full dose lovenox -CT chest also confirms acute PE -Transition Lovenox to Eliquis  Hypokalemia -replaced  HTN -BP stable on cardizem  Anxiety -continue on xanax  History of opioid addiction -continue on suboxone  DVT prophylaxis: Eliquis  Code Status: full code Family Communication: unable to reach husband and daughter over the phone, left message 1/6 Disposition Plan: Status is: Inpatient  Remains inpatient appropriate because:Inpatient level of care appropriate due to severity of illness   Dispo: The patient is from: Home              Anticipated d/c is to: TBD              Anticipated d/c date is: 2 days              Patient currently is not medically stable to d/c.    Consultants:     Procedures:      Antimicrobials:       Subjective: She is feeling significantly better today.  She continues to have cough.  Feels her shortness of breath is improving.  She is still on high flow nasal cannula at 15 L  Objective: Vitals:   02/21/20 0607 02/21/20 0812 02/21/20 1600 02/21/20 1626  BP: (!) 141/93  106/74   Pulse: 99  (!) 111 99  Resp: 18  20   Temp: 98.2 F (36.8 C)  98.4 F (36.9 C)   TempSrc: Oral  Axillary   SpO2: (!) 89% 90% 90%   Weight:      Height:       No intake or output data in the 24 hours ending 02/21/20 1953 Filed Weights   02/19/20 1623 02/20/20 1520  Weight: 98.9 kg 91.5 kg    Examination:  General exam: Alert, awake, oriented x 3 Respiratory system: Clear to auscultation. Respiratory effort normal. Cardiovascular system:RRR. No murmurs, rubs, gallops. Gastrointestinal system: Abdomen is nondistended, soft and nontender. No organomegaly or masses felt. Normal bowel sounds heard. Central nervous system: Alert and oriented. No focal neurological deficits. Extremities: No C/C/E, +pedal pulses Skin: No rashes, lesions or ulcers Psychiatry: Judgement and insight appear normal. Mood & affect appropriate.     Data Reviewed: I have personally reviewed following labs and imaging studies  CBC: Recent Labs  Lab 02/19/20 1659 02/20/20 0619 02/21/20 0611  WBC 8.2  6.1 8.7  NEUTROABS 6.9 5.1 7.1  HGB 13.4 13.6 13.7  HCT 40.8 42.8 42.6  MCV 89.3 90.1 91.0  PLT 317 315 399   Basic Metabolic Panel: Recent Labs  Lab 02/19/20 1659 02/20/20 0619 02/21/20 0611  NA 137 143 144  K 2.7* 3.7 3.3*  CL 91* 98 101  CO2 31 31 31   GLUCOSE 123* 173* 172*  BUN 22 13 23   CREATININE 0.64 0.50 0.52  CALCIUM 8.6* 8.6* 8.8*   GFR: Estimated Creatinine Clearance: 72.1 mL/min (by C-G formula based on SCr of 0.52 mg/dL). Liver Function Tests: Recent Labs  Lab 02/19/20 1659 02/20/20 0619 02/21/20 0611  AST 25 20 44*  ALT 19 16 32  ALKPHOS 102 82 78   BILITOT 1.1 0.7 0.6  PROT 7.8 7.3 7.1  ALBUMIN 3.3* 2.9* 2.9*   No results for input(s): LIPASE, AMYLASE in the last 168 hours. No results for input(s): AMMONIA in the last 168 hours. Coagulation Profile: No results for input(s): INR, PROTIME in the last 168 hours. Cardiac Enzymes: No results for input(s): CKTOTAL, CKMB, CKMBINDEX, TROPONINI in the last 168 hours. BNP (last 3 results) No results for input(s): PROBNP in the last 8760 hours. HbA1C: No results for input(s): HGBA1C in the last 72 hours. CBG: No results for input(s): GLUCAP in the last 168 hours. Lipid Profile: Recent Labs    02/19/20 1659  TRIG 348*   Thyroid Function Tests: No results for input(s): TSH, T4TOTAL, FREET4, T3FREE, THYROIDAB in the last 72 hours. Anemia Panel: Recent Labs    02/19/20 1659  FERRITIN 73   Sepsis Labs: Recent Labs  Lab 02/19/20 1659 02/19/20 2006  PROCALCITON <0.10  --   LATICACIDVEN 1.4 1.3    Recent Results (from the past 240 hour(s))  Blood Culture (routine x 2)     Status: None (Preliminary result)   Collection Time: 02/19/20  4:59 PM   Specimen: BLOOD  Result Value Ref Range Status   Specimen Description BLOOD LEFT ANTECUBITAL  Final   Special Requests   Final    BOTTLES DRAWN AEROBIC AND ANAEROBIC Blood Culture results may not be optimal due to an inadequate volume of blood received in culture bottles   Culture   Final    NO GROWTH 2 DAYS Performed at [facility], [address] [place], [address] [place]    Report Status PENDING  Incomplete  Blood Culture (routine x 2)     Status: None (Preliminary result)   Collection Time: 02/19/20  4:59 PM   Specimen: BLOOD LEFT WRIST  Result Value Ref Range Status   Specimen Description BLOOD LEFT WRIST  Final   Special Requests   Final    BOTTLES DRAWN AEROBIC AND ANAEROBIC Blood Culture results may not be optimal due to an inadequate volume of blood received in culture bottles   Culture   Final    NO GROWTH 2  DAYS Performed at [facility], [address] [place], [address] [place]    Report Status PENDING  Incomplete         Radiology Studies: CT ANGIO CHEST PE W OR WO CONTRAST  Result Date: 02/20/2020 CLINICAL DATA:  Hypoxemia and history of COVID-19 positivity EXAM: CT ANGIOGRAPHY CHEST WITH CONTRAST TECHNIQUE: Multidetector CT imaging of the chest was performed using the standard protocol during bolus administration of intravenous contrast. Multiplanar CT image reconstructions and MIPs were obtained to evaluate the vascular anatomy. CONTRAST:  [id number] OMNIPAQUE IOHEXOL 350 MG/ML SOLN COMPARISON:  Chest  x-ray from the previous day. FINDINGS: Cardiovascular: Thoracic aorta demonstrates atherosclerotic calcification. Dilatation of the ascending aorta to 4.2 cm is noted. No dissection is seen. Coronary calcifications are noted. Pulmonary artery shows a normal branching pattern. Scattered small filling defects are noted within left lower lobe pulmonary artery consistent with pulmonary emboli. No right heart strain is seen. Mediastinum/Nodes: Thoracic inlet is within normal limits. Scattered hilar and mediastinal nodes are noted likely reactive in nature given the parenchymal abnormality. The esophagus is within normal limits with the exception of small sliding-type hiatal hernia. Lungs/Pleura: Lungs are well aerated bilaterally with peripheral ground-glass opacities consistent with the given clinical history of COVID-19 positivity. No sizable effusion is seen. No parenchymal nodules are noted. There are changes in the bronchial tree in the right lower lobe consistent with mucous plugging or possible previous aspiration. Clinical correlation is recommended. Upper Abdomen: Visualized upper abdomen shows no acute abnormality. Musculoskeletal: Degenerative changes of the thoracic spine are seen. No acute rib abnormality is noted. Review of the MIP images confirms the above findings. IMPRESSION: Scattered pulmonary  emboli in the left lower lobe arterial branches. No right heart strain is noted. Patchy airspace opacities consistent with the given clinical history. Soft tissue density within the right lower lobe bronchial tree suspicious for mucous plugging/aspiration. Clinical correlation is recommended. 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 Recommend annual imaging followup by CTA or MRA. This recommendation follows 2010 ACCF/AHA/AATS/ACR/ASA/SCA/SCAI/SIR/STS/SVM Guidelines for the Diagnosis and Management of Patients with Thoracic Aortic Disease. Circulation. 2010; 121: W295-A213. Aortic aneurysm NOS (ICD10-I71.9) Aortic Atherosclerosis (ICD10-I70.0). These results will be called to the ordering clinician or representative by the Radiologist Assistant, and communication documented in the PACS or [employer]. Electronically Signed   By: Alcide Clever M.D.   On: 02/20/2020 12:06   US Venous Img Lower Bilateral (DVT)  Result Date: 02/20/2020 CLINICAL DATA:  Positive D-dimer and right leg swelling EXAM: RIGHT LOWER EXTREMITY VENOUS DOPPLER ULTRASOUND TECHNIQUE: Gray-scale sonography with graded compression, as well as color Doppler and duplex ultrasound were performed to evaluate the lower extremity deep venous systems from the level of the common femoral vein and including the common femoral, femoral, profunda femoral, popliteal and calf veins including the posterior tibial, peroneal and gastrocnemius veins when visible. The superficial great saphenous vein was also interrogated. Spectral Doppler was utilized to evaluate flow at rest and with distal augmentation maneuvers in the common femoral, femoral and popliteal veins. COMPARISON:  None. FINDINGS: Contralateral Common Femoral Vein: Respiratory phasicity is normal and symmetric with the symptomatic side. No evidence of thrombus. Normal compressibility. Common Femoral Vein: No evidence of thrombus. Normal compressibility, respiratory phasicity and  response to augmentation. Saphenofemoral Junction: No evidence of thrombus. Normal compressibility and flow on color Doppler imaging. Profunda Femoral Vein: No evidence of thrombus. Normal compressibility and flow on color Doppler imaging. Femoral Vein: No evidence of thrombus. Normal compressibility, respiratory phasicity and response to augmentation. Popliteal Vein: Thrombus is noted with decreased compressibility. Calf Veins: Thrombus is noted with decreased compressibility in the posterior tibial and peroneal veins. Anterior tibial vein also demonstrates similar thrombus. Superficial Great Saphenous Vein: No evidence of thrombus. Normal compressibility. Venous Reflux:  None. Other Findings:  None. IMPRESSION: Diffuse thrombus throughout the right popliteal and infrapopliteal veins. Electronically Signed   By: Alcide Clever M.D.   On: 02/20/2020 10:31        Scheduled Meds: . baricitinib  4 mg Oral Daily  . diltiazem  180 mg Oral Daily  .  enoxaparin (LOVENOX) injection  1 mg/kg Subcutaneous Q12H  . methocarbamol  500 mg Oral Daily  . methylPREDNISolone (SOLU-MEDROL) injection  1 mg/kg Intravenous Q12H   Followed by  . [START ON 02/23/2020] predniSONE  50 mg Oral Daily  . pantoprazole  40 mg Oral Daily   Continuous Infusions: . remdesivir 100 mg in NS 100 mL 100 mg (02/21/20 0936)     LOS: 2 days    Time spent:    Erick Blinks, MD [facility]   If 7PM-7AM, please contact night-coverage [email or web address]  02/21/2020, 7:53 PM

## 2020-02-21 NOTE — Plan of Care (Signed)
  Problem: Acute Rehab PT Goals(only PT should resolve) Goal: Patient Will Transfer Sit To/From Stand Outcome: Progressing Flowsheets (Taken 02/21/2020 0910) Patient will transfer sit to/from stand: with modified independence Goal: Pt Will Transfer Bed To Chair/Chair To Bed Outcome: Progressing Flowsheets (Taken 02/21/2020 0910) Pt will Transfer Bed to Chair/Chair to Bed: with modified independence Goal: Pt Will Ambulate Outcome: Progressing Flowsheets (Taken 02/21/2020 0910) Pt will Ambulate:  25 feet  with modified independence  with least restrictive assistive device Goal: Pt/caregiver will Perform Home Exercise Program Outcome: Progressing Flowsheets (Taken 02/21/2020 0910) Pt/caregiver will Perform Home Exercise Program:  For increased strengthening  For improved balance  Independently  9:10 AM, 02/21/20 Wyman Songster PT, DPT Physical Therapist at [facility]

## 2020-02-21 NOTE — Care Management Important Message (Signed)
Important Message  Patient Details  Name: [name] [name] MRN: [id number] Date of Birth: 1951/01/23   Medicare Important Message Given:  Yes - Important Message mailed due to current National Emergency     Corey Harold 02/21/2020, 2:55 PM

## 2020-02-21 NOTE — Evaluation (Signed)
Physical Therapy Evaluation Patient Details Name: [name] [name] MRN: [id number] DOB: 28-Mar-1950 Today's Date: 02/21/2020   History of Present Illness  [name] [name]  is a 70 y.o. female, with past medical history of asthma, fibromyalgia, hypertension, glaucoma, came to ED due to cough and shortness of breath, patient report she is on 2.5 L oxygen at nighttime, denies any history of COPD or smoking in the past, she is unaware of any diagnosis of OSA, patient report she tested positive for COVID-19 on home test kit 12/23, this was confirmed by PCR on 12/25, reports symptom has been going on for last 2 weeks, but over last couple days she is having worsening cough, shortness of breath, which prompted her to come to ED, denies any recent fever, chills, reports poor appetite, weakness and fatigue, she denies any chest pain.  -In ED she was 80% on room air, requiring up to 3 L of oxygen, chest x-ray significant for multifocal pneumonia, D-dimers elevated at 5.8, 20.5, lactic acid normal at 1.4, she had hypokalemia with potassium of 2.7, received IV Solu-Medrol by [facility]  hospitalist consulted to admit.    Clinical Impression  Patient on 15L HFNC for session with O2 monitored at 90-92% at rest and 88-90% with exercise and ambulation. Patient able to transition to seated EOB without assist. She demonstrates good sitting balance and sitting tolerance today. She transfers to standing with use of RW without physical assist. Patient completes standing marches with O2 between 88-90% without SOB. Patient showing good/fair standing balance with use of RW and she ambulates with slow cadence in room. Patient will benefit from continued physical therapy in hospital and recommended venue below to increase strength, balance, endurance for safe ADLs and gait.     Follow Up Recommendations Home health PT    Equipment Recommendations  None recommended by PT    Recommendations for Other Services       Precautions /  Restrictions Precautions Precautions: Fall Restrictions Weight Bearing Restrictions: No      Mobility  Bed Mobility Overal bed mobility: Modified Independent                  Transfers Overall transfer level: Needs assistance Equipment used: Rolling walker (2 wheeled) Transfers: Sit to/from [employer] Transfers Sit to Stand: Min guard Stand pivot transfers: Min guard       General transfer comment: min guard for safety/balance, able to transfer with use of RW  Ambulation/Gait Ambulation/Gait assistance: Min guard   Assistive device: Rolling walker (2 wheeled) Gait Pattern/deviations: Decreased stride length Gait velocity: decreased   General Gait Details: slow cadence with use of RW  Stairs            Wheelchair Mobility    Modified Rankin (Stroke Patients Only)       Balance Overall balance assessment: Needs assistance   Sitting balance-Leahy Scale: Normal Sitting balance - Comments: seated EOB   Standing balance support: Bilateral upper extremity supported Standing balance-Leahy Scale: Good Standing balance comment: with RW                             Pertinent Vitals/Pain Pain Assessment: No/denies pain    Home Living Family/patient expects to be discharged to:: Private residence Living Arrangements: Spouse/significant other;Children Available Help at Discharge: Family Type of Home: House Home Access: Level entry     Home Layout: One level Home Equipment: Environmental consultant - 2 wheels  Prior Function Level of Independence: Independent with assistive device(s)         Comments: Patient states independent with ADL,  RW use prn     Hand Dominance        Extremity/Trunk Assessment   Upper Extremity Assessment Upper Extremity Assessment: Overall WFL for tasks assessed    Lower Extremity Assessment Lower Extremity Assessment: Overall WFL for tasks assessed    Cervical / Trunk Assessment Cervical / Trunk  Assessment: Normal  Communication   Communication: No difficulties  Cognition Arousal/Alertness: Awake/alert Behavior During Therapy: WFL for tasks assessed/performed Overall Cognitive Status: Within Functional Limits for tasks assessed                                        General Comments      Exercises General Exercises - Lower Extremity Hip Flexion/Marching: AROM;Both;20 reps;Standing   Assessment/Plan    PT Assessment Patient needs continued PT services  PT Problem List Decreased strength;Decreased activity tolerance;Decreased balance;Decreased mobility;Cardiopulmonary status limiting activity       PT Treatment Interventions DME instruction;Balance training;Gait training;Neuromuscular re-education;Stair training;Functional mobility training;Patient/family education;Therapeutic activities;Therapeutic exercise;Manual techniques    PT Goals (Current goals can be found in the Care Plan section)  Acute Rehab PT Goals Patient Stated Goal: return home with family to assist PT Goal Formulation: With patient Time For Goal Achievement: 03/06/20 Potential to Achieve Goals: Good    Frequency Min 2X/week   Barriers to discharge        Co-evaluation               AM-PAC PT "6 Clicks" Mobility  Outcome Measure Help needed turning from your back to your side while in a flat bed without using bedrails?: None Help needed moving from lying on your back to sitting on the side of a flat bed without using bedrails?: None Help needed moving to and from a bed to a chair (including a wheelchair)?: A Little Help needed standing up from a chair using your arms (e.g., wheelchair or bedside chair)?: A Little Help needed to walk in hospital room?: A Little Help needed climbing 3-5 steps with a railing? : A Lot 6 Click Score: 19    End of Session Equipment Utilized During Treatment: Gait belt;Oxygen Activity Tolerance: Patient tolerated treatment well Patient left:  in bed;with call bell/phone within reach Nurse Communication: Mobility status PT Visit Diagnosis: Unsteadiness on feet (R26.81);Other abnormalities of gait and mobility (R26.89)    Time: 3419-6222 PT Time Calculation (min) (ACUTE ONLY): 23 min   Charges:   PT Evaluation $PT Eval Moderate Complexity: 1 Mod PT Treatments $Therapeutic Exercise: 8-22 mins         9:08 AM, 02/21/20 Mearl Latin PT, DPT Physical Therapist at [facility]

## 2020-02-21 NOTE — Progress Notes (Signed)
   02/21/20 1600  Assess: MEWS Score  Temp 98.4 F (36.9 C)  BP 106/74  Pulse Rate (!) 111  Resp 20  Level of Consciousness Alert  SpO2 90 %  O2 Device HFNC  Patient Activity (if Appropriate) In bed  O2 Flow Rate (L/min) 10 L/min  Assess: MEWS Score  MEWS Temp 0  MEWS Systolic 0  MEWS Pulse 2  MEWS RR 0  MEWS LOC 0  MEWS Score 2  MEWS Score Color Yellow  Assess: if the MEWS score is Yellow or Red  Were vital signs taken at a resting state? No  Focused Assessment No change from prior assessment  Early Detection of Sepsis Score *See Row Information* Low  MEWS guidelines implemented *See Row Information* No, other (Comment) (pt talking loudly on phone and moving around in bed)  Treat  Pain Scale 0-10  Pain Score 3  Pain Type Chronic pain  Patients Stated Pain Goal 3  Pain Intervention(s) Emotional support

## 2020-02-21 NOTE — Progress Notes (Signed)
ANTICOAGULATION CONSULT NOTE - Initial Consult  Pharmacy Consult for Apixaban Indication: pulmonary embolus and DVT  No Known Allergies  Patient Measurements: Height: 5\' 5"  (165.1 cm) Weight: 91.5 kg (201 lb 11.5 oz) IBW/kg (Calculated) : 57  Vital Signs: Temp: 98.4 F (36.9 C) (01/07 1600) Temp Source: Axillary (01/07 1600) BP: 106/74 (01/07 1600) Pulse Rate: 99 (01/07 1626)  Labs: Recent Labs    02/19/20 1659 02/20/20 0619 02/21/20 0611  HGB 13.4 13.6 13.7  HCT 40.8 42.8 42.6  PLT 317 315 399  CREATININE 0.64 0.50 0.52    Estimated Creatinine Clearance: 72.1 mL/min (by C-G formula based on SCr of 0.52 mg/dL).   Medical History: Past Medical History:  Diagnosis Date  . Anxiety   . Asthma   . Ectopic pregnancy   . Fibromyalgia   . Glaucoma   . Hypertension   . OSA (obstructive sleep apnea)    o2 at 2.5 liters at night  . Rheumatoid arthritis(714.0)     Medications:  Scheduled:  . apixaban  10 mg Oral BID   Followed by  . [START ON 02/28/2020] apixaban  5 mg Oral BID  . baricitinib  4 mg Oral Daily  . diltiazem  180 mg Oral Daily  . methocarbamol  500 mg Oral Daily  . methylPREDNISolone (SOLU-MEDROL) injection  1 mg/kg Intravenous Q12H   Followed by  . [START ON 02/23/2020] predniSONE  50 mg Oral Daily  . pantoprazole  40 mg Oral Daily   Infusions:  . remdesivir 100 mg in NS 100 mL 100 mg (02/21/20 0936)    Assessment: 70 yo F with multiple VTEs in the setting of COVID.  Started on Lovenox treatment dose.  Pharmacy asked to switch to Apixaban.  Last Lovenox dose was 1/7 at 0800.  Goal of Therapy:  Therapeutic anticoagulation Monitor platelets by anticoagulation protocol: Yes   Plan:  Start Apixaban 10mg  PO BID x 7 days, followed by Apixaban 5mg  PO BID. Monitor for bleeding. Apixaban education prior to discharge.  3/7, Pharm.D., BCPS Clinical Pharmacist  02/21/2020 8:38 PM

## 2020-02-21 NOTE — Plan of Care (Signed)

## 2020-02-21 NOTE — TOC Initial Note (Signed)
Transition of Care [facility]) - Initial/Assessment Note    Patient Details  Name: [name] [name] MRN: [id number] Date of Birth: 1950-04-18  Transition of Care [facility]) CM/SW Contact:    Annice Needy, LCSW Phone Number: 02/21/2020, 5:36 PM  Clinical Narrative:                 Patient from home with spouse. Admitted COVID+. Independent at baseline. Uses walker sometimes. Drives. PT recommended HHPT. Patient agreeable to HHPT. Agencies discussed. REferral made.   Expected Discharge Plan: Home w Home Health Services Barriers to Discharge: Continued Medical Work up   Patient Goals and CMS Choice Patient states their goals for this hospitalization and ongoing recovery are:: home with [facility]      Expected Discharge Plan and Services Expected Discharge Plan: Home w Home Health Services       Living arrangements for the past 2 months: Single Family Home                           HH Arranged: PT,RN HH Agency: [facility] Date HH Agency Contacted: 02/21/20 Time HH Agency Contacted: 1736 Representative spoke with at [facility] Agency: Bonita Quin  Prior Living Arrangements/Services Living arrangements for the past 2 months: Single Family Home Lives with:: Spouse Patient language and need for interpreter reviewed:: Yes        Need for Family Participation in Patient Care: Yes (Comment) Care giver support system in place?: Yes (comment) Current home services: DME    Activities of Daily Living Home Assistive Devices/Equipment: None ADL Screening (condition at time of admission) Patient's cognitive ability adequate to safely complete daily activities?: Yes Is the patient deaf or have difficulty hearing?: No Does the patient have difficulty seeing, even when wearing glasses/contacts?: No Does the patient have difficulty concentrating, remembering, or making decisions?: No Patient able to express need for assistance with ADLs?: Yes Does the patient have difficulty dressing or  bathing?: No Independently performs ADLs?: Yes (appropriate for developmental age) Does the patient have difficulty walking or climbing stairs?: No Weakness of Legs: None Weakness of Arms/Hands: None  Permission Sought/Granted                  Emotional Assessment     Affect (typically observed): Appropriate Orientation: : Oriented to Self,Oriented to Place,Oriented to  Time Alcohol / Substance Use: Not Applicable Psych Involvement: No (comment)  Admission diagnosis:  Hypokalemia [E87.6] Hypoxia [R09.02] Elevated d-dimer [R79.89] Pneumonia due to COVID-19 virus [U07.1, J12.82] Acute respiratory disease due to COVID-19 virus [U07.1, J06.9] Patient Active Problem List   Diagnosis Date Noted  . Acute deep vein thrombosis (DVT) of popliteal vein of right lower extremity (HCC) 02/20/2020  . Acute pulmonary embolus (HCC) 02/20/2020  . Pneumonia due to COVID-19 virus 02/20/2020  . Acute respiratory failure with hypoxia (HCC) 02/20/2020  . Acute respiratory disease due to COVID-19 virus 02/19/2020  . Hypokalemia   . Obesity, Class III, BMI 40-49.9 (morbid obesity) (HCC)   . Chronic pain 01/01/2017  . Asthma 01/01/2017  . Fibromyalgia 01/01/2017  . Cellulitis of right leg 01/01/2017  . Sepsis (HCC) 12/31/2016   PCP:  [name], MD Pharmacy:   [facility] - [place], [place] - [address] [place] [address] Phone: [phone] Fax: [phone]  [facility] [address], [place] - [address] [place] [address] Phone: [phone] Fax: [phone]  [place], [place] - [address] [address] [place] [address] Phone: [phone] Fax: [phone]     Social Determinants of Health (SDOH) Interventions    Readmission Risk Interventions No flowsheet data found.

## 2020-02-22 DIAGNOSIS — U071 COVID-19: Secondary | ICD-10-CM | POA: Diagnosis not present

## 2020-02-22 DIAGNOSIS — E876 Hypokalemia: Secondary | ICD-10-CM | POA: Diagnosis not present

## 2020-02-22 DIAGNOSIS — I82431 Acute embolism and thrombosis of right popliteal vein: Secondary | ICD-10-CM | POA: Diagnosis not present

## 2020-02-22 DIAGNOSIS — J069 Acute upper respiratory infection, unspecified: Secondary | ICD-10-CM | POA: Diagnosis not present

## 2020-02-22 LAB — COMPREHENSIVE METABOLIC PANEL
ALT: 41 U/L (ref 0–44)
AST: 46 U/L — ABNORMAL HIGH (ref 15–41)
Albumin: 2.8 g/dL — ABNORMAL LOW (ref 3.5–5.0)
Alkaline Phosphatase: 70 U/L (ref 38–126)
Anion gap: 14 (ref 5–15)
BUN: 24 mg/dL — ABNORMAL HIGH (ref 8–23)
CO2: 27 mmol/L (ref 22–32)
Calcium: 8.5 mg/dL — ABNORMAL LOW (ref 8.9–10.3)
Chloride: 96 mmol/L — ABNORMAL LOW (ref 98–111)
Creatinine, Ser: 0.67 mg/dL (ref 0.44–1.00)
GFR, Estimated: >60 mL/min (ref >60)
Glucose, Bld: 232 mg/dL — ABNORMAL HIGH (ref 70–99)
Potassium: 3.5 mmol/L (ref 3.5–5.1)
Sodium: 137 mmol/L (ref 135–145)
Total Bilirubin: 0.7 mg/dL (ref 0.3–1.2)
Total Protein: 6.8 g/dL (ref 6.5–8.1)

## 2020-02-22 LAB — CBC WITH DIFFERENTIAL/PLATELET
Abs Immature Granulocytes: 0.11 10*3/uL — ABNORMAL HIGH (ref 0.00–0.07)
Basophils Absolute: 0 10*3/uL (ref 0.0–0.1)
Basophils Relative: 0 %
Eosinophils Absolute: 0 10*3/uL (ref 0.0–0.5)
Eosinophils Relative: 0 %
HCT: 42 % (ref 36.0–46.0)
Hemoglobin: 13.6 g/dL (ref 12.0–15.0)
Immature Granulocytes: 2 %
Lymphocytes Relative: 10 %
Lymphs Abs: 0.7 10*3/uL (ref 0.7–4.0)
MCH: 29.2 pg (ref 26.0–34.0)
MCHC: 32.4 g/dL (ref 30.0–36.0)
MCV: 90.1 fL (ref 80.0–100.0)
Monocytes Absolute: 0.4 10*3/uL (ref 0.1–1.0)
Monocytes Relative: 5 %
Neutro Abs: 6.3 10*3/uL (ref 1.7–7.7)
Neutrophils Relative %: 83 %
Platelets: 386 10*3/uL (ref 150–400)
RBC: 4.66 MIL/uL (ref 3.87–5.11)
RDW: 14.1 % (ref 11.5–15.5)
WBC: 7.5 10*3/uL (ref 4.0–10.5)
nRBC: 0 % (ref 0.0–0.2)

## 2020-02-22 LAB — C-REACTIVE PROTEIN: CRP: 3.2 mg/dL — ABNORMAL HIGH (ref <1.0)

## 2020-02-22 LAB — D-DIMER, QUANTITATIVE: D-Dimer, Quant: 3.77 ug/mL-FEU — ABNORMAL HIGH (ref 0.00–0.50)

## 2020-02-22 NOTE — Progress Notes (Signed)
PROGRESS NOTE    [name] [name]  UKG:[id number] DOB: 06/16/1950 DOA: 02/19/2020 PCP: [name], MD    Brief Narrative:  70 y/o female admitted to the hospital with shortness of breath. She was found to have acute resp failure with hypoxia secondary to covid 19 pneumonia. Currently on remdesivir, steroids and baracitinib. She was also noted to have RLE DVT and has been started on anticoagulation   Assessment & Plan:   Active Problems:   Hypokalemia   Obesity, Class III, BMI 40-49.9 (morbid obesity) (HCC)   Acute respiratory disease due to COVID-19 virus   Acute deep vein thrombosis (DVT) of popliteal vein of right lower extremity (HCC)   Acute pulmonary embolus (HCC)   Pneumonia due to COVID-19 virus   Acute respiratory failure with hypoxia (HCC)   Acute respiratory failure with hypoxia -secondary to covid 19 pneumonia -currently on HFNC at 10L -continue to wean down oxygen as tolerated  Covid 19 pneumonia -currently on remdesivir and steroids -She is also on baricitinib -continue pulmonary hygiene  Acute RLE DVT and acute pulmonary embolus -noted to have elevated D dimer -Venous Dopplers confirmed acute DVT -precipitated by covid -on full dose lovenox -CT chest also confirms acute PE -She is anticoagulated with Eliquis  Hypokalemia -replaced  HTN -BP stable on cardizem  Anxiety -continue on xanax   DVT prophylaxis: Eliquis  Code Status: full code Family Communication: Discussed with son at the bedside Disposition Plan: Status is: Inpatient  Remains inpatient appropriate because:Inpatient level of care appropriate due to severity of illness   Dispo: The patient is from: Home              Anticipated d/c is to: Home              Anticipated d/c date is: 2 days              Patient currently is not medically stable to d/c.    Consultants:     Procedures:     Antimicrobials:       Subjective: Continues to feel better.  Has productive  cough.  Oxygen has been weaned down to 10 L  Objective: Vitals:   02/21/20 1626 02/21/20 2047 02/22/20 0514 02/22/20 1500  BP:  140/79 (!) 145/97 118/81  Pulse: 99 94 99 99  Resp:  19 18 16   Temp:  (!) 97.4 F (36.3 C) (!) 97.4 F (36.3 C) 98.3 F (36.8 C)  TempSrc:    Axillary  SpO2:  (!) 89% 90% 96%  Weight:      Height:        Intake/Output Summary (Last 24 hours) at 02/22/2020 2041 Last data filed at 02/22/2020 1336 Gross per 24 hour  Intake 480 ml  Output 700 ml  Net -220 ml   Filed Weights   02/19/20 1623 02/20/20 1520  Weight: 98.9 kg 91.5 kg    Examination:  General exam: Alert, awake, oriented x 3 Respiratory system: Clear to auscultation. Respiratory effort normal. Cardiovascular system:RRR. No murmurs, rubs, gallops. Gastrointestinal system: Abdomen is nondistended, soft and nontender. No organomegaly or masses felt. Normal bowel sounds heard. Central nervous system: Alert and oriented. No focal neurological deficits. Extremities: No C/C/E, +pedal pulses Skin: No rashes, lesions or ulcers Psychiatry: Judgement and insight appear normal. Mood & affect appropriate.      Data Reviewed: I have personally reviewed following labs and imaging studies  CBC: Recent Labs  Lab 02/19/20 1659 02/20/20 0619 02/21/20 0611 02/22/20 1400  WBC  8.2 6.1 8.7 7.5  NEUTROABS 6.9 5.1 7.1 6.3  HGB 13.4 13.6 13.7 13.6  HCT 40.8 42.8 42.6 42.0  MCV 89.3 90.1 91.0 90.1  PLT 317 315 399 386   Basic Metabolic Panel: Recent Labs  Lab 02/19/20 1659 02/20/20 0619 02/21/20 0611 02/22/20 1400  NA 137 143 144 137  K 2.7* 3.7 3.3* 3.5  CL 91* 98 101 96*  CO2 GLUCOSE 123* 173* 172* 232*  BUN 24*  CREATININE 0.64 0.50 0.52 0.67  CALCIUM 8.6* 8.6* 8.8* 8.5*   GFR: Estimated Creatinine Clearance: 72.1 mL/min (by C-G formula based on SCr of 0.67 mg/dL). Liver Function Tests: Recent Labs  Lab 02/19/20 1659 02/20/20 0619 02/21/20 0611 02/22/20 1400   AST 25 20 44* 46*  ALT 19 16 32 41  ALKPHOS 102 82 78 70  BILITOT 1.1 0.7 0.6 0.7  PROT 7.8 7.3 7.1 6.8  ALBUMIN 3.3* 2.9* 2.9* 2.8*   No results for input(s): LIPASE, AMYLASE in the last 168 hours. No results for input(s): AMMONIA in the last 168 hours. Coagulation Profile: No results for input(s): INR, PROTIME in the last 168 hours. Cardiac Enzymes: No results for input(s): CKTOTAL, CKMB, CKMBINDEX, TROPONINI in the last 168 hours. BNP (last 3 results) No results for input(s): PROBNP in the last 8760 hours. HbA1C: No results for input(s): HGBA1C in the last 72 hours. CBG: No results for input(s): GLUCAP in the last 168 hours. Lipid Profile: No results for input(s): CHOL, HDL, LDLCALC, TRIG, CHOLHDL, LDLDIRECT in the last 72 hours. Thyroid Function Tests: No results for input(s): TSH, T4TOTAL, FREET4, T3FREE, THYROIDAB in the last 72 hours. Anemia Panel: No results for input(s): VITAMINB12, FOLATE, FERRITIN, TIBC, IRON, RETICCTPCT in the last 72 hours. Sepsis Labs: Recent Labs  Lab 02/19/20 1659 02/19/20 2006  PROCALCITON <0.10  --   LATICACIDVEN 1.4 1.3    Recent Results (from the past 240 hour(s))  Blood Culture (routine x 2)     Status: None (Preliminary result)   Collection Time: 02/19/20  4:59 PM   Specimen: BLOOD  Result Value Ref Range Status   Specimen Description BLOOD LEFT ANTECUBITAL  Final   Special Requests   Final    BOTTLES DRAWN AEROBIC AND ANAEROBIC Blood Culture results may not be optimal due to an inadequate volume of blood received in culture bottles   Culture   Final    NO GROWTH 3 DAYS Performed at [facility], [address] [place], [place] [address]    Report Status PENDING  Incomplete  Blood Culture (routine x 2)     Status: None (Preliminary result)   Collection Time: 02/19/20  4:59 PM   Specimen: BLOOD LEFT WRIST  Result Value Ref Range Status   Specimen Description BLOOD LEFT WRIST  Final   Special Requests   Final    BOTTLES DRAWN  AEROBIC AND ANAEROBIC Blood Culture results may not be optimal due to an inadequate volume of blood received in culture bottles   Culture   Final    NO GROWTH 3 DAYS Performed at [facility], [address] [place], [place] [address]    Report Status PENDING  Incomplete         Radiology Studies: No results found.      Scheduled Meds: . apixaban  10 mg Oral BID   Followed by  . [START ON 02/28/2020] apixaban  5 mg Oral BID  . baricitinib  4 mg Oral  Daily  . diltiazem  180 mg Oral Daily  . methocarbamol  500 mg Oral Daily  . pantoprazole  40 mg Oral Daily  . [START ON 02/23/2020] predniSONE  50 mg Oral Daily   Continuous Infusions: . remdesivir 100 mg in NS 100 mL 100 mg (02/22/20 0939)     LOS: 3 days    Time spent:    Erick Blinks, MD [facility]   If 7PM-7AM, please contact night-coverage [email or web address]  02/22/2020, 8:41 PM

## 2020-02-22 NOTE — Plan of Care (Signed)

## 2020-02-23 DIAGNOSIS — I2699 Other pulmonary embolism without acute cor pulmonale: Secondary | ICD-10-CM | POA: Diagnosis not present

## 2020-02-23 DIAGNOSIS — U071 COVID-19: Secondary | ICD-10-CM | POA: Diagnosis not present

## 2020-02-23 DIAGNOSIS — I82431 Acute embolism and thrombosis of right popliteal vein: Secondary | ICD-10-CM | POA: Diagnosis not present

## 2020-02-23 DIAGNOSIS — E876 Hypokalemia: Secondary | ICD-10-CM | POA: Diagnosis not present

## 2020-02-23 LAB — CBC WITH DIFFERENTIAL/PLATELET
Abs Immature Granulocytes: 0.1 10*3/uL — ABNORMAL HIGH (ref 0.00–0.07)
Basophils Absolute: 0 10*3/uL (ref 0.0–0.1)
Basophils Relative: 0 %
Eosinophils Absolute: 0 10*3/uL (ref 0.0–0.5)
Eosinophils Relative: 0 %
HCT: 43.4 % (ref 36.0–46.0)
Hemoglobin: 14 g/dL (ref 12.0–15.0)
Immature Granulocytes: 1 %
Lymphocytes Relative: 11 %
Lymphs Abs: 1 10*3/uL (ref 0.7–4.0)
MCH: 28.7 pg (ref 26.0–34.0)
MCHC: 32.3 g/dL (ref 30.0–36.0)
MCV: 88.9 fL (ref 80.0–100.0)
Monocytes Absolute: 0.8 10*3/uL (ref 0.1–1.0)
Monocytes Relative: 8 %
Neutro Abs: 7.3 10*3/uL (ref 1.7–7.7)
Neutrophils Relative %: 80 %
Platelets: 335 10*3/uL (ref 150–400)
RBC: 4.88 MIL/uL (ref 3.87–5.11)
RDW: 13.6 % (ref 11.5–15.5)
WBC: 9.2 10*3/uL (ref 4.0–10.5)
nRBC: 0 % (ref 0.0–0.2)

## 2020-02-23 LAB — COMPREHENSIVE METABOLIC PANEL
ALT: 36 U/L (ref 0–44)
AST: 33 U/L (ref 15–41)
Albumin: 2.8 g/dL — ABNORMAL LOW (ref 3.5–5.0)
Alkaline Phosphatase: 63 U/L (ref 38–126)
Anion gap: 10 (ref 5–15)
BUN: 19 mg/dL (ref 8–23)
CO2: 30 mmol/L (ref 22–32)
Calcium: 8.6 mg/dL — ABNORMAL LOW (ref 8.9–10.3)
Chloride: 102 mmol/L (ref 98–111)
Creatinine, Ser: 0.6 mg/dL (ref 0.44–1.00)
GFR, Estimated: >60 mL/min (ref >60)
Glucose, Bld: 132 mg/dL — ABNORMAL HIGH (ref 70–99)
Potassium: 3.1 mmol/L — ABNORMAL LOW (ref 3.5–5.1)
Sodium: 142 mmol/L (ref 135–145)
Total Bilirubin: 0.7 mg/dL (ref 0.3–1.2)
Total Protein: 6.4 g/dL — ABNORMAL LOW (ref 6.5–8.1)

## 2020-02-23 LAB — D-DIMER, QUANTITATIVE: D-Dimer, Quant: 3.46 ug/mL-FEU — ABNORMAL HIGH (ref 0.00–0.50)

## 2020-02-23 LAB — C-REACTIVE PROTEIN: CRP: 1.5 mg/dL — ABNORMAL HIGH (ref <1.0)

## 2020-02-23 LAB — MAGNESIUM: Magnesium: 1.8 mg/dL (ref 1.7–2.4)

## 2020-02-23 MED ORDER — POTASSIUM CHLORIDE CRYS ER 20 MEQ PO TBCR
40.0000 meq | EXTENDED_RELEASE_TABLET | ORAL | Status: DC
  Administered 2020-02-23: 40 meq via ORAL
  Filled 2020-02-23 (×2): qty 2

## 2020-02-23 MED ORDER — PREDNISONE 50 MG PO TABS: 50.0000 mg | ORAL_TABLET | Freq: Every day | ORAL | 0 refills | Status: DC

## 2020-02-23 MED ORDER — APIXABAN 5 MG PO TABS: ORAL_TABLET | ORAL | 1 refills | Status: DC

## 2020-02-23 NOTE — TOC Transition Note (Signed)
Transition of Care [facility]) - CM/SW Discharge Note   Patient Details  Name: [name] MRN: [id number] Date of Birth: Apr 09, 1950  [facility] CM/SW Contact:  Barry Brunner, LCSW Phone Number: 02/23/2020, 3:16 PM   Clinical Narrative:    CSW notified of patient's readiness for discharge. CSW notified Barbara Cower with [facility]. Barbara Cower agreeable to begin services. TOC signing off.    Final next level of care: Home w Home Health Services Barriers to Discharge: Barriers Resolved   Patient Goals and CMS Choice Patient states their goals for this hospitalization and ongoing recovery are:: Return home with [facility] CMS Medicare.gov Compare Post Acute Care list provided to:: Patient Choice offered to / list presented to : Patient  Discharge Placement                    Patient and family notified of of transfer: 02/23/20  Discharge Plan and Services                DME Arranged: N/A DME Agency: NA       HH Arranged: PT,RN HH Agency: [facility] Date HH Agency Contacted: 02/23/20 Time HH Agency Contacted: 1516 Representative spoke with at [facility] Agency: Barbara Cower  Social Determinants of Health (SDOH) Interventions     Readmission Risk Interventions No flowsheet data found.

## 2020-02-23 NOTE — Discharge Summary (Signed)
Physician Discharge Summary  [name] [name] ZOX:[id number] DOB: May 25, 1950 DOA: 02/19/2020  PCP: [name], MD  Admit date: 02/19/2020 Discharge date: 02/23/2020  Admitted From: Home Disposition: Home  Recommendations for Outpatient Follow-up:  1. Follow up with PCP in 1-2 weeks 2. Please obtain BMP/CBC in one week 3. Patient will need to isolate until 1/15 which will complete 21 days of isolation  Home Health: Home health RN, PT Equipment/Devices:  Discharge Condition: Stable CODE STATUS: Full code Diet recommendation: Heart healthy  Brief/Interim Summary: 70 y/o female admitted to the hospital with shortness of breath. She was found to have acute resp failure with hypoxia secondary to covid 19 pneumonia. Currently on remdesivir, steroids and baracitinib. She was also noted to have RLE DVT and has been started on anticoagulation  Discharge Diagnoses:  Active Problems:   Hypokalemia   Acute respiratory disease due to COVID-19 virus   Acute deep vein thrombosis (DVT) of popliteal vein of right lower extremity (HCC)   Acute pulmonary embolus (HCC)   Pneumonia due to COVID-19 virus   Acute respiratory failure with hypoxia (HCC) Obesity, class II  Acute respiratory failure with hypoxia -secondary to covid 19 pneumonia -Patient was requiring up to 15 L oxygen via high flow nasal cannula -She has since been weaned down to room air and is breathing comfortably on ambulation  Covid 19 pneumonia -Treated with remdesivir, steroids and baricitinib -Since she is improved, she has been transitioned to prednisone taper  Acute RLE DVT and acute pulmonary embolus -noted to have elevated D dimer -Venous Dopplers confirmed acute DVT -precipitated by covid -She was initially treated with full dose Lovenox, was subsequently transitioned to Eliquis -CT chest also confirms acute PE, no evidence of right heart strain on CT -She will need to completely 6 months of anticoagulation  therapy  Hypokalemia -replaced  HTN -BP stable on cardizem  Anxiety -continue on xanax  Discharge Instructions  Discharge Instructions    Diet - low sodium heart healthy   Complete by: As directed    Increase activity slowly   Complete by: As directed      Allergies as of 02/23/2020   No Known Allergies     Medication List    STOP taking these medications   pantoprazole 40 MG tablet Commonly known as: Protonix     TAKE these medications   albuterol 2 MG tablet Commonly known as: PROVENTIL Take 2 mg by mouth 3 (three) times daily.   ALPRAZolam 0.5 MG tablet Commonly known as: XANAX Take 0.5 mg 4 (four) times daily as needed by mouth for anxiety.   apixaban 5 MG Tabs tablet Commonly known as: ELIQUIS Take 10mg  po bid for 7 days then 5mg  po bid   buprenorphine-naloxone 8-2 mg Subl SL tablet Commonly known as: SUBOXONE Place 1 tablet under the tongue 4 (four) times daily as needed. Pain.   butalbital-acetaminophen-caffeine 50-325-40 MG tablet Commonly known as: FIORICET Take 1 tablet by mouth 4 (four) times daily.   diltiazem 120 MG tablet Commonly known as: CARDIZEM Take 180 mg by mouth daily.   fexofenadine 180 MG tablet Commonly known as: ALLEGRA Take 180 mg by mouth daily.   GNP MUSCLE RUB EX Apply 1 application topically daily as needed. Pain.   methocarbamol 500 MG tablet Commonly known as: ROBAXIN Take 500 mg daily by mouth.   predniSONE 50 MG tablet Commonly known as: DELTASONE Take 1 tablet (50 mg total) by mouth daily. Start taking on: February 24, 2020  saccharomyces boulardii 250 MG capsule Commonly known as: Florastor Take 1 capsule (250 mg total) 2 (two) times daily by mouth.   VISINE OP Apply 1 drop to eye every morning.       Follow-up Information    [facility] Follow up.   Why: PT and RN Contact information: [address] [place] [address] [phone]             No Known  Allergies  Consultations:     Procedures/Studies: CT ANGIO CHEST PE W OR WO CONTRAST  Result Date: 02/20/2020 CLINICAL DATA:  Hypoxemia and history of COVID-19 positivity EXAM: CT ANGIOGRAPHY CHEST WITH CONTRAST TECHNIQUE: Multidetector CT imaging of the chest was performed using the standard protocol during bolus administration of intravenous contrast. Multiplanar CT image reconstructions and MIPs were obtained to evaluate the vascular anatomy. CONTRAST:  OMNIPAQUE IOHEXOL 350 MG/ML SOLN COMPARISON:  Chest x-ray from the previous day. FINDINGS: Cardiovascular: Thoracic aorta demonstrates atherosclerotic calcification. Dilatation of the ascending aorta to 4.2 cm is noted. No dissection is seen. Coronary calcifications are noted. Pulmonary artery shows a normal branching pattern. Scattered small filling defects are noted within left lower lobe pulmonary artery consistent with pulmonary emboli. No right heart strain is seen. Mediastinum/Nodes: Thoracic inlet is within normal limits. Scattered hilar and mediastinal nodes are noted likely reactive in nature given the parenchymal abnormality. The esophagus is within normal limits with the exception of small sliding-type hiatal hernia. Lungs/Pleura: Lungs are well aerated bilaterally with peripheral ground-glass opacities consistent with the given clinical history of COVID-19 positivity. No sizable effusion is seen. No parenchymal nodules are noted. There are changes in the bronchial tree in the right lower lobe consistent with mucous plugging or possible previous aspiration. Clinical correlation is recommended. Upper Abdomen: Visualized upper abdomen shows no acute abnormality. Musculoskeletal: Degenerative changes of the thoracic spine are seen. No acute rib abnormality is noted. Review of the MIP images confirms the above findings. IMPRESSION: Scattered pulmonary emboli in the left lower lobe arterial branches. No right heart strain is noted. Patchy  airspace opacities consistent with the given clinical history. Soft tissue density within the right lower lobe bronchial tree suspicious for mucous plugging/aspiration. Clinical correlation is recommended. 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 Recommend annual imaging followup by CTA or MRA. This recommendation follows 2010 ACCF/AHA/AATS/ACR/ASA/SCA/SCAI/SIR/STS/SVM Guidelines for the Diagnosis and Management of Patients with Thoracic Aortic Disease. Circulation. 2010; 121: W119-J478. Aortic aneurysm NOS (ICD10-I71.9) Aortic Atherosclerosis (ICD10-I70.0). These results will be called to the ordering clinician or representative by the Radiologist Assistant, and communication documented in the PACS or [employer]. Electronically Signed   By: Alcide Clever M.D.   On: 02/20/2020 12:06   US Venous Img Lower Bilateral (DVT)  Result Date: 02/20/2020 CLINICAL DATA:  Positive D-dimer and right leg swelling EXAM: RIGHT LOWER EXTREMITY VENOUS DOPPLER ULTRASOUND TECHNIQUE: Gray-scale sonography with graded compression, as well as color Doppler and duplex ultrasound were performed to evaluate the lower extremity deep venous systems from the level of the common femoral vein and including the common femoral, femoral, profunda femoral, popliteal and calf veins including the posterior tibial, peroneal and gastrocnemius veins when visible. The superficial great saphenous vein was also interrogated. Spectral Doppler was utilized to evaluate flow at rest and with distal augmentation maneuvers in the common femoral, femoral and popliteal veins. COMPARISON:  None. FINDINGS: Contralateral Common Femoral Vein: Respiratory phasicity is normal and symmetric with the symptomatic side. No evidence  of thrombus. Normal compressibility. Common Femoral Vein: No evidence of thrombus. Normal compressibility, respiratory phasicity and response to augmentation. Saphenofemoral Junction: No evidence of thrombus. Normal  compressibility and flow on color Doppler imaging. Profunda Femoral Vein: No evidence of thrombus. Normal compressibility and flow on color Doppler imaging. Femoral Vein: No evidence of thrombus. Normal compressibility, respiratory phasicity and response to augmentation. Popliteal Vein: Thrombus is noted with decreased compressibility. Calf Veins: Thrombus is noted with decreased compressibility in the posterior tibial and peroneal veins. Anterior tibial vein also demonstrates similar thrombus. Superficial Great Saphenous Vein: No evidence of thrombus. Normal compressibility. Venous Reflux:  None. Other Findings:  None. IMPRESSION: Diffuse thrombus throughout the right popliteal and infrapopliteal veins. Electronically Signed   By: Alcide Clever M.D.   On: 02/20/2020 10:31   DG Chest Portable 1 View  Result Date: 02/19/2020 CLINICAL DATA:  Shortness of breath, COVID-19 positivity EXAM: PORTABLE CHEST 1 VIEW COMPARISON:  12/31/2016 FINDINGS: Cardiac shadow is enlarged but stable. Lungs are well aerated bilaterally. Diffuse bilateral airspace opacities are noted consistent with the given clinical history of COVID-19 positivity. Postsurgical changes in the cervical spine are seen. No effusion is seen. IMPRESSION: Patchy airspace opacities bilaterally consistent with the given clinical history. Electronically Signed   By: Alcide Clever M.D.   On: 02/19/2020 16:54      Subjective:   Discharge Exam: Vitals:   02/22/20 2051 02/23/20 0430 02/23/20 0939 02/23/20 1451  BP: (!) 150/106 135/80  129/81  Pulse: 95 91  90  Resp: 19 19    Temp: 98.2 F (36.8 C) 98.1 F (36.7 C)  98 F (36.7 C)  TempSrc:    Oral  SpO2: 95% 96% 93% 93%  Weight:      Height:        General: Pt is alert, awake, not in acute distress Cardiovascular: RRR, S1/S2 +, no rubs, no gallops Respiratory: CTA bilaterally, no wheezing, no rhonchi Abdominal: Soft, NT, ND, bowel sounds + Extremities: no edema, no cyanosis    The  results of significant diagnostics from this hospitalization (including imaging, microbiology, ancillary and laboratory) are listed below for reference.     Microbiology: Recent Results (from the past 240 hour(s))  Blood Culture (routine x 2)     Status: None (Preliminary result)   Collection Time: 02/19/20  4:59 PM   Specimen: BLOOD  Result Value Ref Range Status   Specimen Description BLOOD LEFT ANTECUBITAL  Final   Special Requests   Final    BOTTLES DRAWN AEROBIC AND ANAEROBIC Blood Culture results may not be optimal due to an inadequate volume of blood received in culture bottles   Culture   Final    NO GROWTH 4 DAYS Performed at [facility], [address], [place], [place] [address]    Report Status PENDING  Incomplete  Blood Culture (routine x 2)     Status: None (Preliminary result)   Collection Time: 02/19/20  4:59 PM   Specimen: BLOOD LEFT WRIST  Result Value Ref Range Status   Specimen Description BLOOD LEFT WRIST  Final   Special Requests   Final    BOTTLES DRAWN AEROBIC AND ANAEROBIC Blood Culture results may not be optimal due to an inadequate volume of blood received in culture bottles   Culture   Final    NO GROWTH 4 DAYS Performed at [facility], [address] [place], [place] [address]    Report Status PENDING  Incomplete     Labs: BNP (last  3 results) No results for input(s): BNP in the last 8760 hours. Basic Metabolic Panel: Recent Labs  Lab 02/19/20 1659 02/20/20 0619 02/21/20 0611 02/22/20 1400 02/23/20 1134  NA 137 143 144 137 142  K 2.7* 3.7 3.3* 3.5 3.1*  CL 91* 98 101 96* 102  CO2 31 31 31 27 30   GLUCOSE 123* 173* 172* 232* 132*  BUN 22 13 23  24* 19  CREATININE 0.64 0.50 0.52 0.67 0.60  CALCIUM 8.6* 8.6* 8.8* 8.5* 8.6*  MG  --   --   --   --  1.8   Liver Function Tests: Recent Labs  Lab 02/19/20 1659 02/20/20 0619 02/21/20 0611 02/22/20 1400 02/23/20 1134  AST 25 20 44* 46* 33  ALT 19 16 32 41 36  ALKPHOS 102 82 78 70 63   BILITOT 1.1 0.7 0.6 0.7 0.7  PROT 7.8 7.3 7.1 6.8 6.4*  ALBUMIN 3.3* 2.9* 2.9* 2.8* 2.8*   No results for input(s): LIPASE, AMYLASE in the last 168 hours. No results for input(s): AMMONIA in the last 168 hours. CBC: Recent Labs  Lab 02/19/20 1659 02/20/20 0619 02/21/20 0611 02/22/20 1400 02/23/20 1134  WBC 8.2 6.1 8.7 7.5 9.2  NEUTROABS 6.9 5.1 7.1 6.3 7.3  HGB 13.4 13.6 13.7 13.6 14.0  HCT 40.8 42.8 42.6 42.0 43.4  MCV 89.3 90.1 91.0 90.1 88.9  PLT 317 315 399 386 335   Cardiac Enzymes: No results for input(s): CKTOTAL, CKMB, CKMBINDEX, TROPONINI in the last 168 hours. BNP: Invalid input(s): POCBNP CBG: No results for input(s): GLUCAP in the last 168 hours. D-Dimer Recent Labs    02/22/20 1400 02/23/20 1134  DDIMER 3.77* 3.46*   Hgb A1c No results for input(s): HGBA1C in the last 72 hours. Lipid Profile No results for input(s): CHOL, HDL, LDLCALC, TRIG, CHOLHDL, LDLDIRECT in the last 72 hours. Thyroid function studies No results for input(s): TSH, T4TOTAL, T3FREE, THYROIDAB in the last 72 hours.  Invalid input(s): FREET3 Anemia work up No results for input(s): VITAMINB12, FOLATE, FERRITIN, TIBC, IRON, RETICCTPCT in the last 72 hours. Urinalysis    Component Value Date/Time   COLORURINE YELLOW 12/31/2016 1051   APPEARANCEUR CLEAR 12/31/2016 1051   LABSPEC 1.013 12/31/2016 1051   PHURINE 8.0 12/31/2016 1051   GLUCOSEU NEGATIVE 12/31/2016 1051   HGBUR NEGATIVE 12/31/2016 1051   BILIRUBINUR NEGATIVE 12/31/2016 1051   KETONESUR NEGATIVE 12/31/2016 1051   PROTEINUR 30 (A) 12/31/2016 1051   UROBILINOGEN 0.2 06/11/2010 1149   NITRITE NEGATIVE 12/31/2016 1051   LEUKOCYTESUR NEGATIVE 12/31/2016 1051   Sepsis Labs Invalid input(s): PROCALCITONIN,  WBC,  LACTICIDVEN Microbiology Recent Results (from the past 240 hour(s))  Blood Culture (routine x 2)     Status: None (Preliminary result)   Collection Time: 02/19/20  4:59 PM   Specimen: BLOOD  Result Value Ref  Range Status   Specimen Description BLOOD LEFT ANTECUBITAL  Final   Special Requests   Final    BOTTLES DRAWN AEROBIC AND ANAEROBIC Blood Culture results may not be optimal due to an inadequate volume of blood received in culture bottles   Culture   Final    NO GROWTH 4 DAYS Performed at [facility], [address] [place], [place] [address]    Report Status PENDING  Incomplete  Blood Culture (routine x 2)     Status: None (Preliminary result)   Collection Time: 02/19/20  4:59 PM   Specimen: BLOOD LEFT WRIST  Result Value Ref Range Status   Specimen  Description BLOOD LEFT WRIST  Final   Special Requests   Final    BOTTLES DRAWN AEROBIC AND ANAEROBIC Blood Culture results may not be optimal due to an inadequate volume of blood received in culture bottles   Culture   Final    NO GROWTH 4 DAYS Performed at [facility], [address] [place], [place] [address]    Report Status PENDING  Incomplete     Time coordinating discharge:  SIGNED:   Erick Blinks, MD  [facility] 02/23/2020, 9:02 PM   If 7PM-7AM, please contact night-coverage [email or web address]

## 2020-02-23 NOTE — Discharge Instructions (Signed)
You will have to quarantine until 02/29/2020

## 2020-02-24 DIAGNOSIS — I82431 Acute embolism and thrombosis of right popliteal vein: Secondary | ICD-10-CM | POA: Diagnosis not present

## 2020-02-24 DIAGNOSIS — F419 Anxiety disorder, unspecified: Secondary | ICD-10-CM | POA: Diagnosis not present

## 2020-02-24 DIAGNOSIS — G4733 Obstructive sleep apnea (adult) (pediatric): Secondary | ICD-10-CM | POA: Diagnosis not present

## 2020-02-24 DIAGNOSIS — I2699 Other pulmonary embolism without acute cor pulmonale: Secondary | ICD-10-CM | POA: Diagnosis not present

## 2020-02-24 DIAGNOSIS — M797 Fibromyalgia: Secondary | ICD-10-CM | POA: Diagnosis not present

## 2020-02-24 DIAGNOSIS — Z79891 Long term (current) use of opiate analgesic: Secondary | ICD-10-CM | POA: Diagnosis not present

## 2020-02-24 DIAGNOSIS — Z6836 Body mass index (BMI) 36.0-36.9, adult: Secondary | ICD-10-CM | POA: Diagnosis not present

## 2020-02-24 DIAGNOSIS — Z7952 Long term (current) use of systemic steroids: Secondary | ICD-10-CM | POA: Diagnosis not present

## 2020-02-24 DIAGNOSIS — I10 Essential (primary) hypertension: Secondary | ICD-10-CM | POA: Diagnosis not present

## 2020-02-24 DIAGNOSIS — U071 COVID-19: Secondary | ICD-10-CM | POA: Diagnosis not present

## 2020-02-24 DIAGNOSIS — E876 Hypokalemia: Secondary | ICD-10-CM | POA: Diagnosis not present

## 2020-02-24 DIAGNOSIS — J1282 Pneumonia due to coronavirus disease 2019: Secondary | ICD-10-CM | POA: Diagnosis not present

## 2020-02-24 DIAGNOSIS — J9601 Acute respiratory failure with hypoxia: Secondary | ICD-10-CM | POA: Diagnosis not present

## 2020-02-24 DIAGNOSIS — Z7901 Long term (current) use of anticoagulants: Secondary | ICD-10-CM | POA: Diagnosis not present

## 2020-02-24 DIAGNOSIS — E669 Obesity, unspecified: Secondary | ICD-10-CM | POA: Diagnosis not present

## 2020-02-24 DIAGNOSIS — J45909 Unspecified asthma, uncomplicated: Secondary | ICD-10-CM | POA: Diagnosis not present

## 2020-02-24 DIAGNOSIS — H409 Unspecified glaucoma: Secondary | ICD-10-CM | POA: Diagnosis not present

## 2020-02-24 DIAGNOSIS — Z9981 Dependence on supplemental oxygen: Secondary | ICD-10-CM | POA: Diagnosis not present

## 2020-02-24 DIAGNOSIS — M069 Rheumatoid arthritis, unspecified: Secondary | ICD-10-CM | POA: Diagnosis not present

## 2020-02-24 LAB — CULTURE, BLOOD (ROUTINE X 2)
Culture: NO GROWTH
Culture: NO GROWTH

## 2020-02-25 DIAGNOSIS — I82431 Acute embolism and thrombosis of right popliteal vein: Secondary | ICD-10-CM | POA: Diagnosis not present

## 2020-02-25 DIAGNOSIS — I2699 Other pulmonary embolism without acute cor pulmonale: Secondary | ICD-10-CM | POA: Diagnosis not present

## 2020-02-25 DIAGNOSIS — J9601 Acute respiratory failure with hypoxia: Secondary | ICD-10-CM | POA: Diagnosis not present

## 2020-02-25 DIAGNOSIS — U071 COVID-19: Secondary | ICD-10-CM | POA: Diagnosis not present

## 2020-02-25 DIAGNOSIS — J1282 Pneumonia due to coronavirus disease 2019: Secondary | ICD-10-CM | POA: Diagnosis not present

## 2020-02-25 DIAGNOSIS — E876 Hypokalemia: Secondary | ICD-10-CM | POA: Diagnosis not present

## 2020-02-28 DIAGNOSIS — J1282 Pneumonia due to coronavirus disease 2019: Secondary | ICD-10-CM | POA: Diagnosis not present

## 2020-02-28 DIAGNOSIS — I82431 Acute embolism and thrombosis of right popliteal vein: Secondary | ICD-10-CM | POA: Diagnosis not present

## 2020-02-28 DIAGNOSIS — E876 Hypokalemia: Secondary | ICD-10-CM | POA: Diagnosis not present

## 2020-02-28 DIAGNOSIS — U071 COVID-19: Secondary | ICD-10-CM | POA: Diagnosis not present

## 2020-02-28 DIAGNOSIS — J9601 Acute respiratory failure with hypoxia: Secondary | ICD-10-CM | POA: Diagnosis not present

## 2020-02-28 DIAGNOSIS — I2699 Other pulmonary embolism without acute cor pulmonale: Secondary | ICD-10-CM | POA: Diagnosis not present

## 2020-03-19 DIAGNOSIS — U099 Post covid-19 condition, unspecified: Secondary | ICD-10-CM | POA: Diagnosis not present

## 2020-03-19 DIAGNOSIS — Z6841 Body Mass Index (BMI) 40.0 and over, adult: Secondary | ICD-10-CM | POA: Diagnosis not present

## 2020-03-19 DIAGNOSIS — G43909 Migraine, unspecified, not intractable, without status migrainosus: Secondary | ICD-10-CM | POA: Diagnosis not present

## 2020-03-19 DIAGNOSIS — G894 Chronic pain syndrome: Secondary | ICD-10-CM | POA: Diagnosis not present

## 2020-03-19 DIAGNOSIS — I10 Essential (primary) hypertension: Secondary | ICD-10-CM | POA: Diagnosis not present

## 2020-04-15 DIAGNOSIS — G894 Chronic pain syndrome: Secondary | ICD-10-CM | POA: Diagnosis not present

## 2020-04-15 DIAGNOSIS — G4700 Insomnia, unspecified: Secondary | ICD-10-CM | POA: Diagnosis not present

## 2020-04-15 DIAGNOSIS — Z6839 Body mass index (BMI) 39.0-39.9, adult: Secondary | ICD-10-CM | POA: Diagnosis not present

## 2020-05-15 DIAGNOSIS — G894 Chronic pain syndrome: Secondary | ICD-10-CM | POA: Diagnosis not present

## 2020-05-15 DIAGNOSIS — M47812 Spondylosis without myelopathy or radiculopathy, cervical region: Secondary | ICD-10-CM | POA: Diagnosis not present

## 2020-05-15 DIAGNOSIS — M1991 Primary osteoarthritis, unspecified site: Secondary | ICD-10-CM | POA: Diagnosis not present

## 2020-05-15 DIAGNOSIS — I10 Essential (primary) hypertension: Secondary | ICD-10-CM | POA: Diagnosis not present

## 2020-07-10 DIAGNOSIS — G894 Chronic pain syndrome: Secondary | ICD-10-CM | POA: Diagnosis not present

## 2020-07-10 DIAGNOSIS — Z79891 Long term (current) use of opiate analgesic: Secondary | ICD-10-CM | POA: Diagnosis not present

## 2020-08-10 DIAGNOSIS — G43909 Migraine, unspecified, not intractable, without status migrainosus: Secondary | ICD-10-CM | POA: Diagnosis not present

## 2020-08-10 DIAGNOSIS — G894 Chronic pain syndrome: Secondary | ICD-10-CM | POA: Diagnosis not present

## 2020-09-03 DIAGNOSIS — Z79899 Other long term (current) drug therapy: Secondary | ICD-10-CM | POA: Diagnosis not present

## 2020-09-03 DIAGNOSIS — E039 Hypothyroidism, unspecified: Secondary | ICD-10-CM | POA: Diagnosis not present

## 2020-09-03 DIAGNOSIS — E785 Hyperlipidemia, unspecified: Secondary | ICD-10-CM | POA: Diagnosis not present

## 2020-10-16 DIAGNOSIS — Z681 Body mass index (BMI) 19 or less, adult: Secondary | ICD-10-CM | POA: Diagnosis not present

## 2020-10-16 DIAGNOSIS — I10 Essential (primary) hypertension: Secondary | ICD-10-CM | POA: Diagnosis not present

## 2020-10-16 DIAGNOSIS — E669 Obesity, unspecified: Secondary | ICD-10-CM | POA: Diagnosis not present

## 2020-10-16 DIAGNOSIS — G894 Chronic pain syndrome: Secondary | ICD-10-CM | POA: Diagnosis not present

## 2020-12-14 DIAGNOSIS — I10 Essential (primary) hypertension: Secondary | ICD-10-CM | POA: Diagnosis not present

## 2020-12-14 DIAGNOSIS — Z6841 Body Mass Index (BMI) 40.0 and over, adult: Secondary | ICD-10-CM | POA: Diagnosis not present

## 2020-12-14 DIAGNOSIS — F4321 Adjustment disorder with depressed mood: Secondary | ICD-10-CM | POA: Diagnosis not present

## 2020-12-14 DIAGNOSIS — J329 Chronic sinusitis, unspecified: Secondary | ICD-10-CM | POA: Diagnosis not present

## 2020-12-14 DIAGNOSIS — H8110 Benign paroxysmal vertigo, unspecified ear: Secondary | ICD-10-CM | POA: Diagnosis not present

## 2020-12-14 DIAGNOSIS — M47812 Spondylosis without myelopathy or radiculopathy, cervical region: Secondary | ICD-10-CM | POA: Diagnosis not present

## 2020-12-14 DIAGNOSIS — M1991 Primary osteoarthritis, unspecified site: Secondary | ICD-10-CM | POA: Diagnosis not present

## 2021-02-22 DIAGNOSIS — H8110 Benign paroxysmal vertigo, unspecified ear: Secondary | ICD-10-CM | POA: Diagnosis not present

## 2021-02-22 DIAGNOSIS — G894 Chronic pain syndrome: Secondary | ICD-10-CM | POA: Diagnosis not present

## 2021-02-22 DIAGNOSIS — G43909 Migraine, unspecified, not intractable, without status migrainosus: Secondary | ICD-10-CM | POA: Diagnosis not present

## 2021-02-22 DIAGNOSIS — I10 Essential (primary) hypertension: Secondary | ICD-10-CM | POA: Diagnosis not present

## 2021-03-10 DIAGNOSIS — G894 Chronic pain syndrome: Secondary | ICD-10-CM | POA: Diagnosis not present

## 2021-03-10 DIAGNOSIS — M47812 Spondylosis without myelopathy or radiculopathy, cervical region: Secondary | ICD-10-CM | POA: Diagnosis not present

## 2021-03-10 DIAGNOSIS — E6609 Other obesity due to excess calories: Secondary | ICD-10-CM | POA: Diagnosis not present

## 2021-03-10 DIAGNOSIS — Z6837 Body mass index (BMI) 37.0-37.9, adult: Secondary | ICD-10-CM | POA: Diagnosis not present

## 2021-03-10 DIAGNOSIS — G43909 Migraine, unspecified, not intractable, without status migrainosus: Secondary | ICD-10-CM | POA: Diagnosis not present

## 2021-03-10 DIAGNOSIS — I10 Essential (primary) hypertension: Secondary | ICD-10-CM | POA: Diagnosis not present

## 2021-03-24 DIAGNOSIS — H6990 Unspecified Eustachian tube disorder, unspecified ear: Secondary | ICD-10-CM | POA: Diagnosis not present

## 2021-03-24 DIAGNOSIS — Z6837 Body mass index (BMI) 37.0-37.9, adult: Secondary | ICD-10-CM | POA: Diagnosis not present

## 2021-03-24 DIAGNOSIS — Z1331 Encounter for screening for depression: Secondary | ICD-10-CM | POA: Diagnosis not present

## 2021-03-24 DIAGNOSIS — G894 Chronic pain syndrome: Secondary | ICD-10-CM | POA: Diagnosis not present

## 2021-03-24 DIAGNOSIS — M1991 Primary osteoarthritis, unspecified site: Secondary | ICD-10-CM | POA: Diagnosis not present

## 2021-03-24 DIAGNOSIS — E6609 Other obesity due to excess calories: Secondary | ICD-10-CM | POA: Diagnosis not present

## 2021-03-24 DIAGNOSIS — M47812 Spondylosis without myelopathy or radiculopathy, cervical region: Secondary | ICD-10-CM | POA: Diagnosis not present

## 2021-04-06 DIAGNOSIS — M1991 Primary osteoarthritis, unspecified site: Secondary | ICD-10-CM | POA: Diagnosis not present

## 2021-04-06 DIAGNOSIS — I10 Essential (primary) hypertension: Secondary | ICD-10-CM | POA: Diagnosis not present

## 2021-04-06 DIAGNOSIS — G894 Chronic pain syndrome: Secondary | ICD-10-CM | POA: Diagnosis not present

## 2021-04-14 DIAGNOSIS — Z1331 Encounter for screening for depression: Secondary | ICD-10-CM | POA: Diagnosis not present

## 2021-04-14 DIAGNOSIS — N812 Incomplete uterovaginal prolapse: Secondary | ICD-10-CM | POA: Diagnosis not present

## 2021-04-14 DIAGNOSIS — Z6841 Body Mass Index (BMI) 40.0 and over, adult: Secondary | ICD-10-CM | POA: Diagnosis not present

## 2021-04-14 DIAGNOSIS — Z0001 Encounter for general adult medical examination with abnormal findings: Secondary | ICD-10-CM | POA: Diagnosis not present

## 2021-04-14 DIAGNOSIS — I872 Venous insufficiency (chronic) (peripheral): Secondary | ICD-10-CM | POA: Diagnosis not present

## 2021-04-23 ENCOUNTER — Encounter: Payer: Self-pay | Admitting: Adult Health

## 2021-04-23 ENCOUNTER — Ambulatory Visit (INDEPENDENT_AMBULATORY_CARE_PROVIDER_SITE_OTHER): Payer: Medicare Other | Admitting: Adult Health

## 2021-04-23 ENCOUNTER — Other Ambulatory Visit: Payer: Self-pay

## 2021-04-23 VITALS — BP 116/80 | HR 77 | Ht 61.0 in | Wt 186.0 lb

## 2021-04-23 DIAGNOSIS — N814 Uterovaginal prolapse, unspecified: Secondary | ICD-10-CM

## 2021-04-23 DIAGNOSIS — Z4689 Encounter for fitting and adjustment of other specified devices: Secondary | ICD-10-CM | POA: Diagnosis not present

## 2021-04-23 NOTE — Progress Notes (Signed)
?  Subjective:  ?  ? Patient ID: [name] [name], female   DOB: [birth year], 71 y.o.   MRN: [id number] ? ?HPI ?[name] [name] is a 71 year old white female, widowed, PM in for prolapse, referred by PCP, she sees Dr Gerarda Fraction at [facility]. She is a new pt.  ? ?Review of Systems ?Has prolapse, has to push back in ?Denies any problems with urination or BMs ? ?Reviewed past medical,surgical, social and family history. Reviewed medications and allergies.  ?   ?Objective:  ? Physical Exam ?BP 116/80 (BP Location: Left Arm, Patient Position: Sitting, Cuff Size: Large)   Pulse 77   Ht 5\' 1"  (1.549 m)   Wt 186 lb (84.4 kg)   BMI 35.14 kg/m?   ?  Skin warm and dry.Pelvic: external genitalia is normal in appearance no lesions, vagina:pale +cystocele, grade 3, and uterine prolapse, fitted with #5 ring with support, by Dr Elonda Husky, and then I inserted, #5 ring with support for her to wear home,urethra has no lesions or masses noted, cervix:smooth, uterus: normal size, shape and contour, non tender, no masses felt, adnexa: no masses or tenderness noted. Bladder is non tender and no masses felt.On rectal exam has good tone no masses or rectocele. ?AA is 0 ?Fall risk is moderate, uses walker ?Depression screen [facility] 2/9 04/23/2021  ?Decreased Interest 0  ?Down, Depressed, Hopeless 0  ?PHQ - 2 Score 0  ?Altered sleeping 0  ?Tired, decreased energy 0  ?Change in appetite 0  ?Feeling bad or failure about yourself  0  ?Trouble concentrating 0  ?Moving slowly or fidgety/restless 0  ?Suicidal thoughts 0  ?PHQ-9 Score 0  ?  ?GAD 7 : Generalized Anxiety Score 04/23/2021  ?Nervous, Anxious, on Edge 0  ?Control/stop worrying 0  ?Worry too much - different things 0  ?Trouble relaxing 1  ?Restless 0  ?Easily annoyed or irritable 0  ?Afraid - awful might happen 0  ?Total GAD 7 Score 1  ? ?  ? Upstream - 04/23/21 1002   ? ?  ? Pregnancy Intention Screening  ? Does the patient want to become pregnant in the next year? No   ? Does the patient's partner want to become  pregnant in the next year? No   ? Would the patient like to discuss contraceptive options today? No   ?  ? Contraception Wrap Up  ? Current Method Female Sterilization   postmenopausal  ? End Method Female Sterilization   ? Contraception Counseling Provided No   ? ?  ?  ? ?  ? Examination chaperoned by Marcelino Scot RN ? ?Assessment:  ?   ?1. Cystocele,grade 3 with uterine prolapse ? ? ?2. Encounter for fitting and adjustment of pessary ?Fitted with #5 ring pessary with support ?   ?Plan:  ?   ?Follow up with me in 4 weeks for pessary check  ?   ?

## 2021-05-03 ENCOUNTER — Telehealth: Payer: Self-pay | Admitting: Adult Health

## 2021-05-03 NOTE — Telephone Encounter (Signed)
Take senokot S 1-2 daily to help with BMS  ?

## 2021-05-05 DIAGNOSIS — G894 Chronic pain syndrome: Secondary | ICD-10-CM | POA: Diagnosis not present

## 2021-05-05 DIAGNOSIS — N8112 Cystocele, lateral: Secondary | ICD-10-CM | POA: Diagnosis not present

## 2021-05-05 DIAGNOSIS — M1991 Primary osteoarthritis, unspecified site: Secondary | ICD-10-CM | POA: Diagnosis not present

## 2021-05-05 DIAGNOSIS — N813 Complete uterovaginal prolapse: Secondary | ICD-10-CM | POA: Diagnosis not present

## 2021-05-05 DIAGNOSIS — Z6839 Body mass index (BMI) 39.0-39.9, adult: Secondary | ICD-10-CM | POA: Diagnosis not present

## 2021-05-05 DIAGNOSIS — N3281 Overactive bladder: Secondary | ICD-10-CM | POA: Diagnosis not present

## 2021-05-05 DIAGNOSIS — M47812 Spondylosis without myelopathy or radiculopathy, cervical region: Secondary | ICD-10-CM | POA: Diagnosis not present

## 2021-05-24 ENCOUNTER — Ambulatory Visit: Payer: Medicare Other | Admitting: Adult Health

## 2021-05-24 DIAGNOSIS — M1991 Primary osteoarthritis, unspecified site: Secondary | ICD-10-CM | POA: Diagnosis not present

## 2021-05-24 DIAGNOSIS — M47812 Spondylosis without myelopathy or radiculopathy, cervical region: Secondary | ICD-10-CM | POA: Diagnosis not present

## 2021-05-24 DIAGNOSIS — R143 Flatulence: Secondary | ICD-10-CM | POA: Diagnosis not present

## 2021-05-24 DIAGNOSIS — N813 Complete uterovaginal prolapse: Secondary | ICD-10-CM | POA: Diagnosis not present

## 2021-05-24 DIAGNOSIS — Z6839 Body mass index (BMI) 39.0-39.9, adult: Secondary | ICD-10-CM | POA: Diagnosis not present

## 2021-05-24 DIAGNOSIS — G894 Chronic pain syndrome: Secondary | ICD-10-CM | POA: Diagnosis not present

## 2021-06-04 ENCOUNTER — Other Ambulatory Visit: Payer: Self-pay | Admitting: *Deleted

## 2021-06-04 ENCOUNTER — Ambulatory Visit (INDEPENDENT_AMBULATORY_CARE_PROVIDER_SITE_OTHER): Payer: Medicare Other | Admitting: Adult Health

## 2021-06-04 ENCOUNTER — Encounter: Payer: Self-pay | Admitting: Adult Health

## 2021-06-04 VITALS — BP 125/82 | HR 82 | Ht 61.0 in | Wt 185.0 lb

## 2021-06-04 DIAGNOSIS — I872 Venous insufficiency (chronic) (peripheral): Secondary | ICD-10-CM

## 2021-06-04 DIAGNOSIS — R300 Dysuria: Secondary | ICD-10-CM | POA: Diagnosis not present

## 2021-06-04 DIAGNOSIS — N814 Uterovaginal prolapse, unspecified: Secondary | ICD-10-CM

## 2021-06-04 DIAGNOSIS — Z4689 Encounter for fitting and adjustment of other specified devices: Secondary | ICD-10-CM

## 2021-06-04 MED ORDER — SULFAMETHOXAZOLE-TRIMETHOPRIM 800-160 MG PO TABS: 1.0000 | ORAL_TABLET | Freq: Two times a day (BID) | ORAL | 0 refills | Status: DC

## 2021-06-04 NOTE — Progress Notes (Signed)
?  Subjective:  ?  ? Patient ID: [name] [name], female   DOB: [birth year], 71 y.o.   MRN: [id number] ? ?HPI ?[name] is a 71 year old white female, widowed, PM in for pessary check had # 5 milex ring with support inserted 04/23/21 and loves it.she is having burning with urination and taking AZO. ?PCP is Dr Gerarda Fraction. ? ?Review of Systems ?+burning with urination at end of stream, taking AZO ?No vaginal discharge or blood, loves the pessary ?Reviewed past medical,surgical, social and family history. Reviewed medications and allergies.  ?   ?Objective:  ? Physical Exam ?BP 125/82 (BP Location: Left Arm, Patient Position: Sitting, Cuff Size: Large)   Pulse 82   Ht 5\' 1"  (1.549 m)   Wt 185 lb (83.9 kg)   BMI 34.96 kg/m?   ?  Skin warm and dry.Pelvic: external genitalia is normal in appearance no lesions, hair orange,vagina: pessary removed, # 5 milex ring with support,no discharge,no lesions in vagina; +cystocele and prolapse, pessary washed and dried and easily inserted,urethra has no lesions or masses noted, cervix:smooth and bulbous, uterus: normal size, shape and contour, non tender, no masses felt, adnexa: no masses or tenderness noted. Bladder is non tender and no masses felt. ?Examination chaperoned by Marcelino Scot RN ? ?Assessment:  ?   ?1. Pessary maintenance ? ?2. Cystocele with uterine prolapse ? ?3. Burning with urination ?UA C&S sent ?Will rx septra ds ?Meds ordered this encounter  ?Medications  ? sulfamethoxazole-trimethoprim (BACTRIM DS) 800-160 MG tablet  ?  Sig: Take 1 tablet by mouth 2 (two) times daily. Take 1 bid  ?  Dispense:  14 tablet  ?  Refill:  0  ?  Order Specific Question:   Supervising Provider  ?  Answer:   Tania Ade H [[id number]]  ?  ?   ?Plan:  ?   ?Return in 3 months for pessary maintenance  ? ?   ?

## 2021-06-05 LAB — URINALYSIS, ROUTINE W REFLEX MICROSCOPIC
Bilirubin, UA: NEGATIVE
Glucose, UA: NEGATIVE
Ketones, UA: NEGATIVE
Nitrite, UA: POSITIVE — AB
Specific Gravity, UA: 1.026 (ref 1.005–1.030)
Urobilinogen, Ur: 1 mg/dL (ref 0.2–1.0)
pH, UA: 5 (ref 5.0–7.5)

## 2021-06-05 LAB — MICROSCOPIC EXAMINATION
Casts: NONE SEEN /lpf
RBC, Urine: >30 /hpf — AB (ref 0–2)
WBC, UA: >30 /hpf — AB (ref 0–5)

## 2021-06-09 LAB — URINE CULTURE

## 2021-06-16 ENCOUNTER — Inpatient Hospital Stay (HOSPITAL_COMMUNITY): Admission: RE | Admit: 2021-06-16 | Payer: Medicare Other | Source: Ambulatory Visit

## 2021-06-16 ENCOUNTER — Encounter: Payer: Medicare Other | Admitting: Vascular Surgery

## 2021-06-29 IMAGING — DX DG CHEST 1V PORT
1 series · 1 of 1 positions shown · non-contrast
Comparison: 12/31/2016

CLINICAL DATA: Shortness of breath, S0KLB-4U positivity

EXAM:
PORTABLE CHEST 1 VIEW

[chest ap]
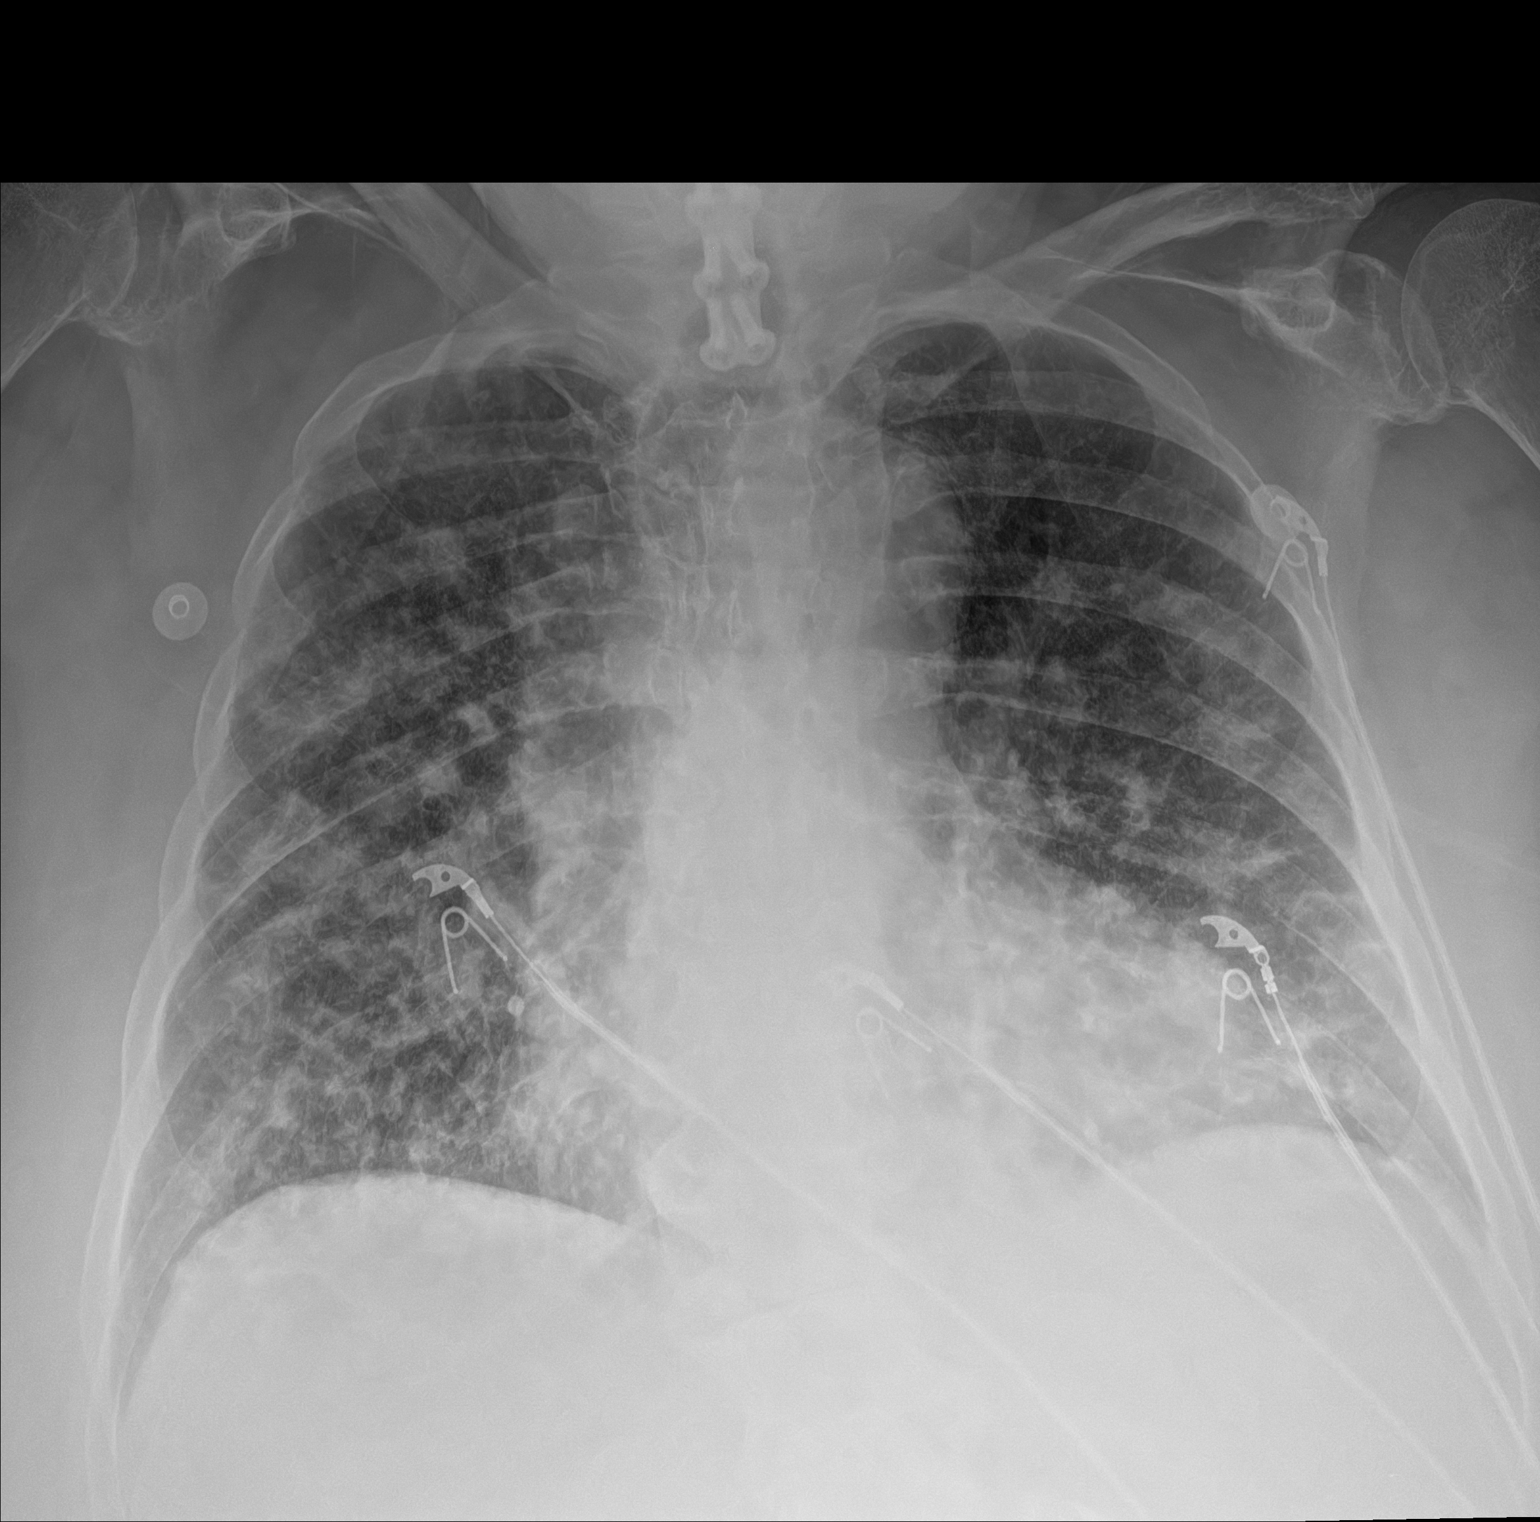

[1 of 1 positions shown; findings below may reference images not displayed]

FINDINGS: Cardiac shadow is enlarged but stable. Lungs are well aerated
bilaterally. Diffuse bilateral airspace opacities are noted
consistent with the given clinical history of S0KLB-4U positivity.
Postsurgical changes in the cervical spine are seen. No effusion is
seen.
IMPRESSION: Patchy airspace opacities bilaterally consistent with the given
clinical history.

## 2021-06-30 DIAGNOSIS — M1991 Primary osteoarthritis, unspecified site: Secondary | ICD-10-CM | POA: Diagnosis not present

## 2021-06-30 DIAGNOSIS — G894 Chronic pain syndrome: Secondary | ICD-10-CM | POA: Diagnosis not present

## 2021-06-30 DIAGNOSIS — M47812 Spondylosis without myelopathy or radiculopathy, cervical region: Secondary | ICD-10-CM | POA: Diagnosis not present

## 2021-06-30 IMAGING — US US EXTREM LOW VENOUS
1 series · 13 of 24 positions shown · non-contrast
Comparison: None.

CLINICAL DATA: Positive D-dimer and right leg swelling



[Series 1: us venous img lower bilat (dvt) · portal-venous · 13 of 60 slices shown]
[im 1/60]
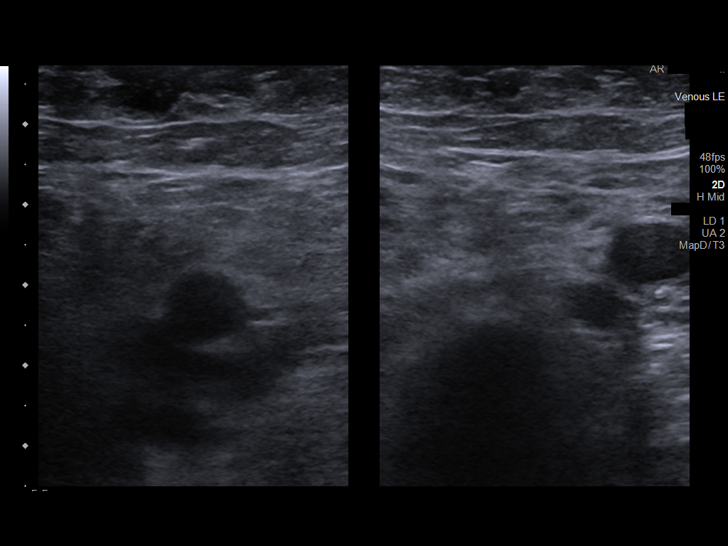
[im 6/60]
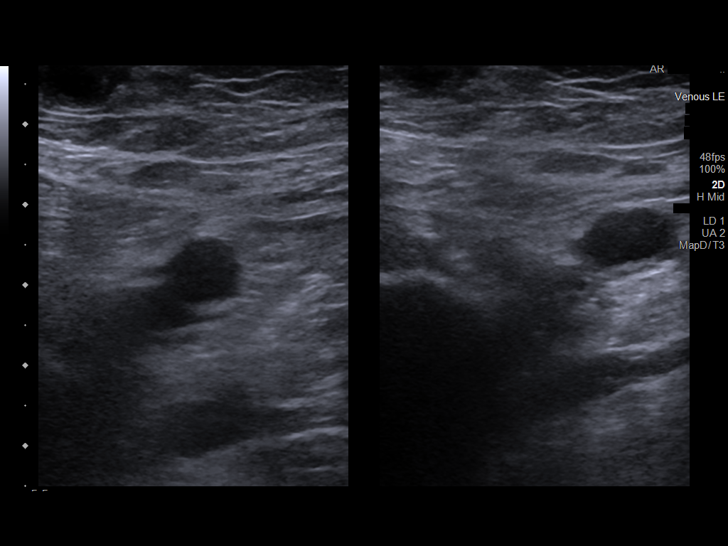
[im 11/60]
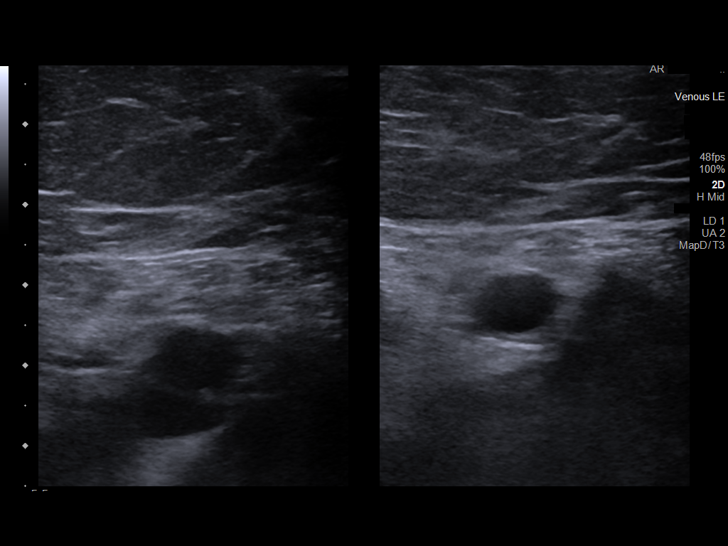
[im 16/60]
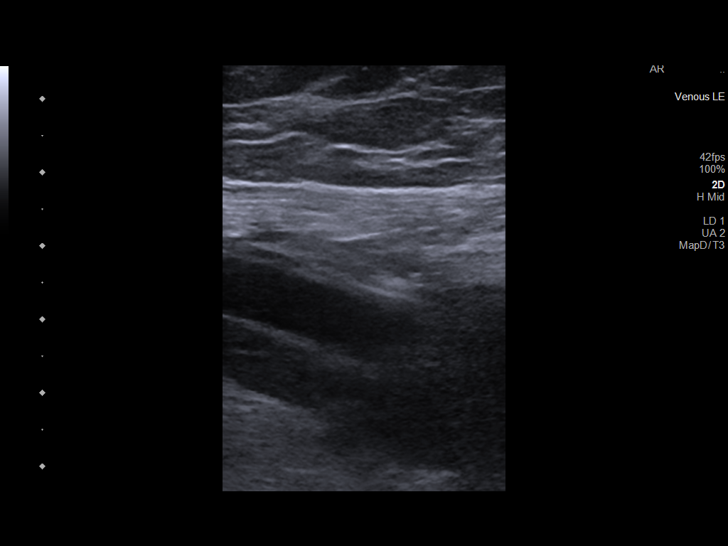
[im 21/60]
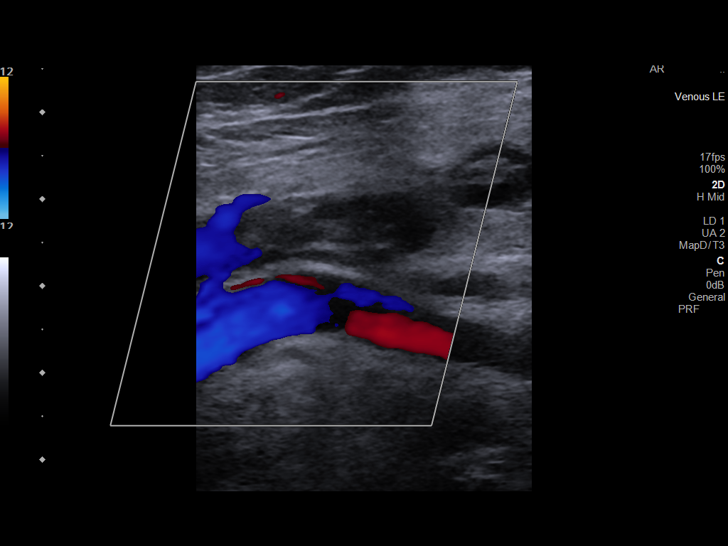
[im 26/60]
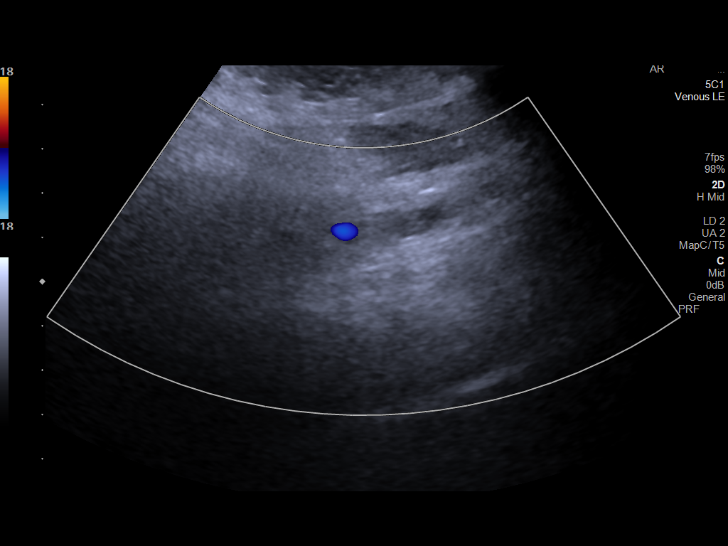
[im 31/60]
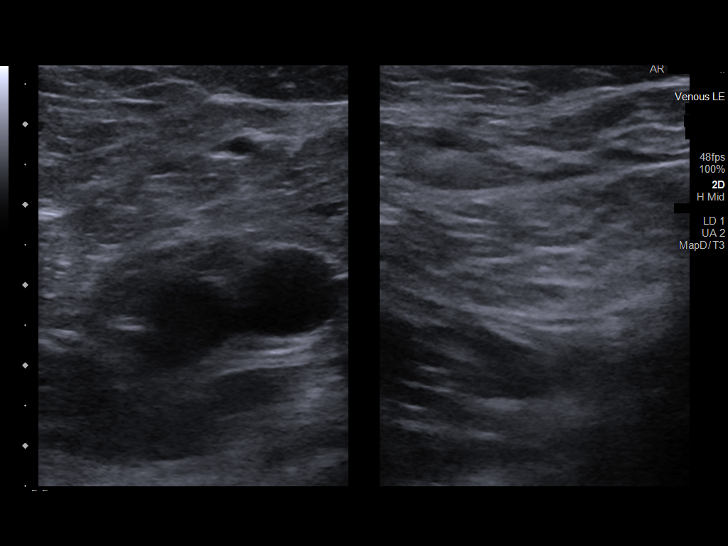
[im 34/60]
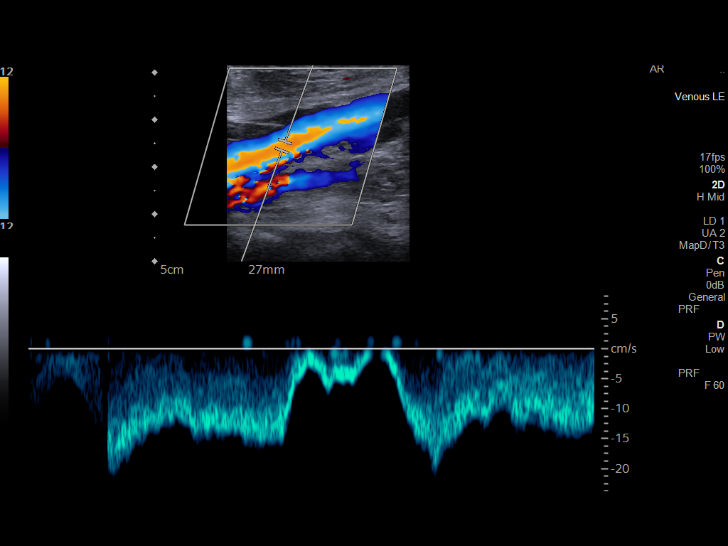
[im 39/60]
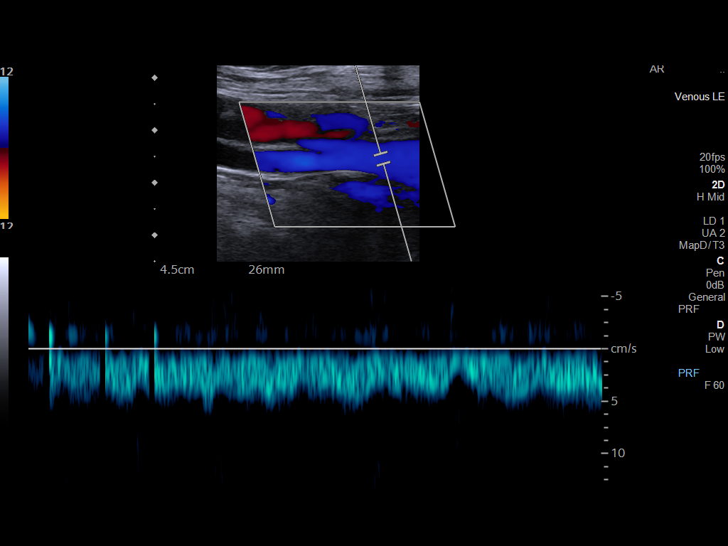
[im 44/60]
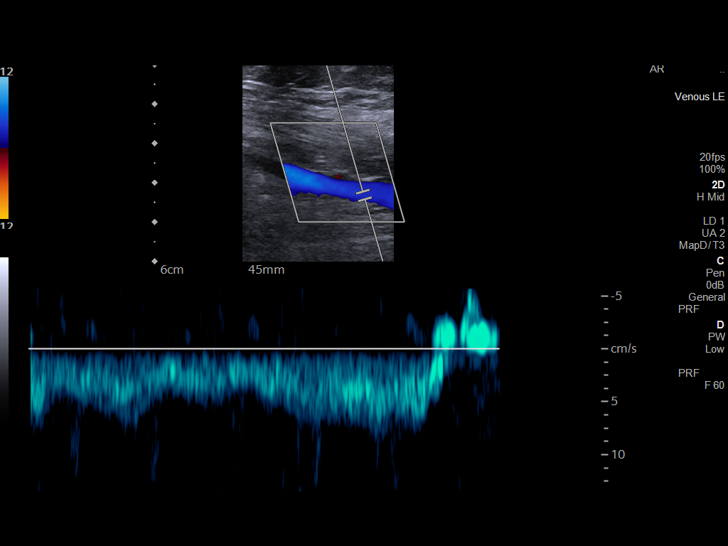
[im 49/60]
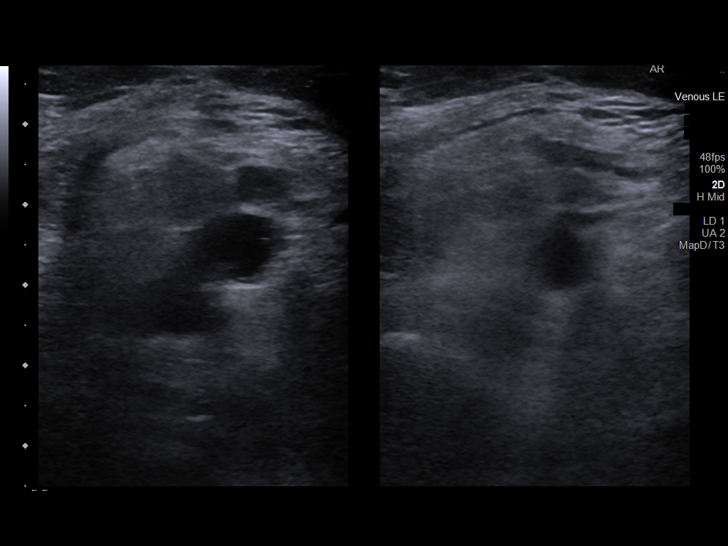
[im 54/60]
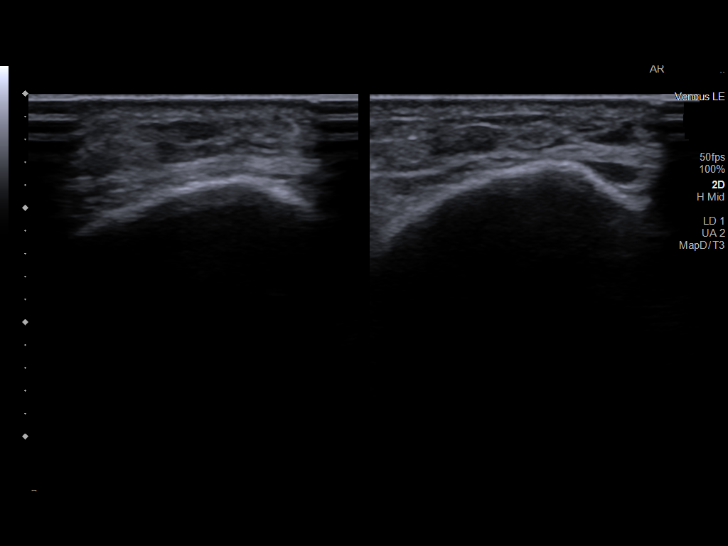
[im 60/60]
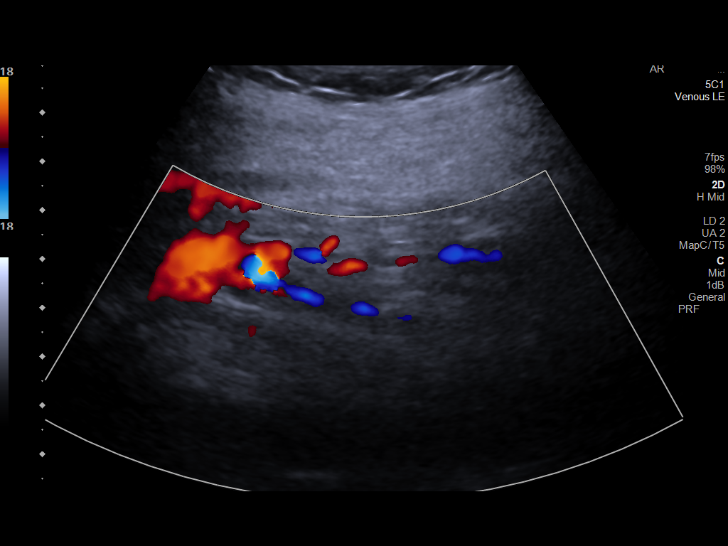

[13 of 24 positions shown; findings below may reference images not displayed]

FINDINGS: Contralateral Common Femoral Vein: Respiratory phasicity is normal
and symmetric with the symptomatic side. No evidence of thrombus.
Normal compressibility.

Common Femoral Vein: No evidence of thrombus. Normal
compressibility, respiratory phasicity and response to augmentation.

Saphenofemoral Junction: No evidence of thrombus. Normal
compressibility and flow on color Doppler imaging.

Profunda Femoral Vein: No evidence of thrombus. Normal
compressibility and flow on color Doppler imaging.

Femoral Vein: No evidence of thrombus. Normal compressibility,
respiratory phasicity and response to augmentation.

Popliteal Vein: Thrombus is noted with decreased compressibility.

Calf Veins: Thrombus is noted with decreased compressibility in the
posterior tibial and peroneal veins. Anterior tibial vein also
demonstrates similar thrombus.

Superficial Great Saphenous Vein: No evidence of thrombus. Normal
compressibility.

Venous Reflux:  None.

Other Findings:  None.
IMPRESSION: Diffuse thrombus throughout the right popliteal and infrapopliteal
veins.

## 2021-07-14 NOTE — Progress Notes (Signed)
[facility]           OF [place]  History and Physical   TASHARRA NODINE is a 71 y.o. female who comes in today with her daughter Leotis Shames and  presents with BLE venous insufficiency.  She states that her right leg is more bothersome.  She did have Covid and developed blood clots in both legs and was on anticoagulation.  She has hx of varicose veins that started with her first pregnancy.  She did wear compression years ago but not recently.  She does have family hx with her mother and her son.  She has hx of vein stripping in the right leg. She will sometimes use a walker.  She does have achiness in her legs and some swelling.  She does have skin color changes bilaterally with right worse than left.  She has occasional pain in her legs with walking but not all the time.  She has pain in her knees bilaterally as she has had bilateral knee surgeries.   She has hx of RA and fibromyalgia.  She does wear oxygen at night.   She did have a 70lb weight loss while taking care of her husband and while grieving after he passed away.    The pt is not on a statin for cholesterol management.  The pt is not on a daily aspirin.   Other AC:  none The pt is on diuretic for hypertension.   The pt is not diabetic.   Tobacco hx:  never   Past Medical History:  Diagnosis Date   Anxiety    Asthma    Ectopic pregnancy    Fibromyalgia    Glaucoma    Hypertension    OSA (obstructive sleep apnea)    o2 at 2.5 liters at night   Rheumatoid arthritis(714.0)     Past Surgical History:  Procedure Laterality Date   BACK SURGERY     CATARACT EXTRACTION     CATARACT EXTRACTION W/PHACO  01/16/2012   Procedure: CATARACT EXTRACTION PHACO AND INTRAOCULAR LENS PLACEMENT (IOC);  Surgeon: Susa Simmonds, MD;  Location: [facility];  Service: Ophthalmology;  Laterality: Right;  CDE: 14.05   CERVICAL LAMINECTOMY     with plating   CHOLECYSTECTOMY     ECTOPIC PREGNANCY SURGERY     KNEE  ARTHROSCOPY     TUBAL LIGATION     vericose vein surgery      Social History   Socioeconomic History   Marital status: Widowed    Spouse name: Not on file   Number of children: Not on file   Years of education: Not on file   Highest education level: Not on file  Occupational History   Not on file  Tobacco Use   Smoking status: Never   Smokeless tobacco: Never  Vaping Use   Vaping Use: Never used  Substance and Sexual Activity   Alcohol use: No   Drug use: No   Sexual activity: Not Currently    Birth control/protection: Post-menopausal, Surgical    Comment: tubal  Other Topics Concern   Not on file  Social History Narrative   Not on file   Social Determinants of Health   Financial Resource Strain: Low Risk    Difficulty of Paying Living Expenses: Not hard at all  Food Insecurity: No Food Insecurity   Worried About Running Out of Food in the Last Year: Never true   Ran Out  of Food in the Last Year: Never true  Transportation Needs: No Transportation Needs   Lack of Transportation (Medical): No   Lack of Transportation (Non-Medical): No  Physical Activity: Insufficiently Active   Days of Exercise per Week: 2 days   Minutes of Exercise per Session: 20 min  Stress: No Stress Concern Present   Feeling of Stress : Not at all  Social Connections: Moderately Integrated   Frequency of Communication with Friends and Family: Three times a week   Frequency of Social Gatherings with Friends and Family: Twice a week   Attends Religious Services: More than 4 times per year   Active Member of Clubs or Organizations: No   Attends Banker Meetings: 1 to 4 times per year   Marital Status: Widowed  Catering manager Violence: Not At Risk   Fear of Current or Ex-Partner: No   Emotionally Abused: No   Physically Abused: No   Sexually Abused: No     Family History  Problem Relation Age of Onset   Stroke Maternal Grandmother    Parkinson's disease Father     Arthritis Mother    Osteoporosis Mother    Fibromyalgia Mother    Parkinson's disease Brother     Current Outpatient Medications  Medication Sig Dispense Refill   butalbital-acetaminophen-caffeine (FIORICET WITH CODEINE) 50-325-40-30 MG capsule Take 1 capsule by mouth 4 (four) times daily.     fexofenadine (ALLEGRA) 180 MG tablet Take 180 mg by mouth daily.     fluticasone (FLONASE) 50 MCG/ACT nasal spray Place 2 sprays into both nostrils daily.     furosemide (LASIX) 20 MG tablet Take 20 mg by mouth daily as needed. (Patient not taking: Reported on 04/23/2021)     hydrOXYzine (ATARAX) 25 MG tablet Take 25-50 mg by mouth at bedtime.     methocarbamol (ROBAXIN) 500 MG tablet Take by mouth.     oxyCODONE (ROXICODONE) 15 MG immediate release tablet Take by mouth.     OXYGEN Inhale 2 L into the lungs at bedtime.     sulfamethoxazole-trimethoprim (BACTRIM DS) 800-160 MG tablet Take 1 tablet by mouth 2 (two) times daily. Take 1 bid 14 tablet 0   No current facility-administered medications for this visit.    Allergies  Allergen Reactions   Dolobid [Diflunisal]    Biaxin [Clarithromycin] Nausea And Vomiting   Lyrica [Pregabalin] Swelling   Other Nausea And Vomiting    Vicoprofen   Oxycontin [Oxycodone] Nausea And Vomiting   Singulair [Montelukast] Nausea And Vomiting    REVIEW OF SYSTEMS:    denotes positive finding,  denotes negative finding Cardiac  Comments:  Chest pain or chest pressure:    Shortness of breath upon exertion:    Short of breath when lying flat:    Irregular heart rhythm:        Vascular    Pain in calf, thigh, or hip brought on by ambulation:    Pain in feet at night that wakes you up from your sleep:     Blood clot in your veins: x   Leg swelling:  x       Pulmonary    Oxygen at home:    Productive cough:     Wheezing:         Neurologic    Sudden weakness in arms or legs:     Sudden numbness in arms or legs:     Sudden onset of difficulty  speaking or slurred speech:  Temporary loss of vision in one eye:     Problems with dizziness:         Gastrointestinal    Blood in stool:     Vomited blood:         Genitourinary    Burning when urinating:     Blood in urine:        Psychiatric    Major depression:         Hematologic    Bleeding problems:    Problems with blood clotting too easily:        Skin    Rashes or ulcers:        Constitutional    Fever or chills:      PHYSICAL EXAMINATION:  Today's Vitals   07/22/21 1501  BP: (!) 141/98  Pulse: 94  Resp: 20  Temp: (!) 97.2 F (36.2 C)  TempSrc: Temporal  SpO2: 99%  Weight: 173 lb 6.4 oz (78.7 kg)  Height:  (1.549 m)  PainSc: 5    Body mass index is 32.76 kg/m.   General:  WDWN in NAD; vital signs documented above Gait: Not observed HENT: WNL, normocephalic Pulmonary: normal non-labored breathing without wheezing Cardiac: regular HR; without carotid bruits Abdomen: soft, NT, no masses; aortic pulse is not palpable Skin: without rashes Vascular Exam/Pulses:  Right Left  Radial 2+ (normal) 2+ (normal)  DP 2+ (normal) 2+ (normal)   Extremities: hemosiderin staining bilateral legs with right worse than left.  + swelling BLE    Neurologic: A&O X 3;  moving all extremities equally Psychiatric:  The pt has Normal affect.   Non-Invasive Vascular Imaging:   Venous duplex on 07/22/2021: Venous Reflux Times  +--------------+---------+------+-----------+------------+----------------+   RIGHT         Reflux NoRefluxReflux TimeDiameter cmsComments                                  Yes                                           +--------------+---------+------+-----------+------------+----------------+  CFV           no                                                      -------------+---------+------+-----------+------------+----------------+  FV prox       no                                                       +--------------+---------+------+-----------+------------+----------------+  FV mid        no                                                      +--------------+---------+------+-----------+------------+----------------+  FV dist       no  chronic thrombus  +-------------+---------+------+-----------+------------+----------------+  Popliteal               yes   >1 second                               +--------------+---------+------+-----------+------------+----------------+  GSV at SFJ              yes    >500 ms     0.574                      +--------------+---------+------+-----------+------------+----------------+  GSV prox thigh                                      NWV              +--------------+---------+------+-----------+------------+----------------+  GSV mid thigh           yes    >500 ms      0.52                      +--------------+---------+------+-----------+------------+----------------+  GSV dist thigh          yes    >500 ms     0.342                      +--------------+---------+------+-----------+------------+----------------+  GSV at knee             yes    >500 ms     0.383                      +--------------+---------+------+-----------+------------+----------------+  GSV prox calf           yes    >500 ms      0.39                      +--------------+---------+------+-----------+------------+----------------+  SSV Pop Fossa no                           0.165                      +--------------+---------+------+-----------+------------+----------------+  SSV prox calf no                            0.26                      +--------------+---------+------+-----------+------------+----------------+  SSV mid calf            yes    >500 ms     0.274                      +--------------+---------+------+-----------+------------+----------------+  AASV mid  thigh                                      acute thrombus    +--------------+---------+------+-----------+------------+----------------+   Summary:  Right:  - Focal area 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  - AASV varicose vein thrombus in the prox and mid thigh.  - Deep vein reflux in the popliteal vein.  - Superficial vein reflux  in the mid SSV, SFJ, and GSV.  - Complex cyst in the popliteal fossa.  - Unable to follow the GSV in the proximal thigh. Numerous varicosities.    Blanch MediaWanda M Orton is a 71 y.o. 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  She had hx of DVT with Covid and [facility] and she did finish her course of AC.  She does have acute thrombus in the AASV.  Discussed with Dr. Edilia Boickson and pt does not need to be on anticoagulation for chronic thrombus or the thrombus in the AASV.   Pt does have venous reflux in the deep system at the popliteal vein as well as the GSV at the [facility] and throughout the GSV in the thigh and proximal calf.   -discussed with pt about wearing thigh high 20-30 mmHg compression stockings and pt was measured for these today.    -discussed the importance of leg elevation and how to elevate properly - pt is advised to elevate their legs and a diagram is given to them to demonstrate for pt to lay flat on their back with knees elevated and slightly bent with their feet higher than their knees, which puts their feet higher than their heart for 15 minutes per day.  If pt cannot lay flat, advised to lay as flat as possible.  -pt is advised to continue as much walking as possible and avoid sitting or standing for long periods of time.  -discussed importance of weight loss and exercise and that water aerobics would also be beneficial.  She has lost around 70lbs while taking care of her husband as well as during grieving after  he passed away -handout with recommendations given -discussed with pt that she may or may not be a candidate for laser ablation, however, she is going to wear her thigh high compression and elevate her legs and return in 3 months to be further evaluated.   I am hopeful she will get some relief with her compression and elevation.    Doreatha MassedSamantha Tirso Laws, [facility] 07/14/2021 3:45 PM  Clinic MD:  Edilia Boickson

## 2021-07-22 ENCOUNTER — Ambulatory Visit (HOSPITAL_COMMUNITY)
Admission: RE | Admit: 2021-07-22 | Discharge: 2021-07-22 | Disposition: A | Discharge status: Home / Self Care | Payer: Medicare Other | Source: Ambulatory Visit | Attending: Vascular Surgery | Admitting: Vascular Surgery

## 2021-07-22 ENCOUNTER — Ambulatory Visit (INDEPENDENT_AMBULATORY_CARE_PROVIDER_SITE_OTHER): Payer: Medicare Other | Admitting: Physician Assistant

## 2021-07-22 VITALS — BP 141/98 | HR 94 | Temp 97.2°F | Resp 20 | Ht 61.0 in | Wt 173.4 lb

## 2021-07-22 DIAGNOSIS — I872 Venous insufficiency (chronic) (peripheral): Secondary | ICD-10-CM | POA: Diagnosis not present

## 2021-07-22 DIAGNOSIS — I8393 Asymptomatic varicose veins of bilateral lower extremities: Secondary | ICD-10-CM

## 2021-07-28 DIAGNOSIS — G43909 Migraine, unspecified, not intractable, without status migrainosus: Secondary | ICD-10-CM | POA: Diagnosis not present

## 2021-07-28 DIAGNOSIS — G894 Chronic pain syndrome: Secondary | ICD-10-CM | POA: Diagnosis not present

## 2021-07-28 DIAGNOSIS — I10 Essential (primary) hypertension: Secondary | ICD-10-CM | POA: Diagnosis not present

## 2021-07-28 DIAGNOSIS — M47812 Spondylosis without myelopathy or radiculopathy, cervical region: Secondary | ICD-10-CM | POA: Diagnosis not present

## 2021-08-18 ENCOUNTER — Telehealth: Payer: Self-pay

## 2021-08-18 NOTE — Telephone Encounter (Signed)
Pt's daughter, Leotis Shames, called stating that the pt's thigh high compression stockings were too tight and she wanted to get a larger size.  Reviewed pt's chart, returned call, two identifiers used. Informed her that the larger size would not fit her height. Educated her on the 3 month use of the stockings before a possible procedure and gave tips on how to apply. Offered pt opportunities to come back into office for another fitting. Daughter stated that they both had a better understanding now and she would assist the pt on putting them on everyday. Pt confirmed understanding.

## 2021-08-27 DIAGNOSIS — I10 Essential (primary) hypertension: Secondary | ICD-10-CM | POA: Diagnosis not present

## 2021-08-27 DIAGNOSIS — E6609 Other obesity due to excess calories: Secondary | ICD-10-CM | POA: Diagnosis not present

## 2021-08-27 DIAGNOSIS — G894 Chronic pain syndrome: Secondary | ICD-10-CM | POA: Diagnosis not present

## 2021-08-27 DIAGNOSIS — I872 Venous insufficiency (chronic) (peripheral): Secondary | ICD-10-CM | POA: Diagnosis not present

## 2021-08-27 DIAGNOSIS — I700 Atherosclerosis of aorta: Secondary | ICD-10-CM | POA: Diagnosis not present

## 2021-08-27 DIAGNOSIS — Z6836 Body mass index (BMI) 36.0-36.9, adult: Secondary | ICD-10-CM | POA: Diagnosis not present

## 2021-08-27 DIAGNOSIS — G43909 Migraine, unspecified, not intractable, without status migrainosus: Secondary | ICD-10-CM | POA: Diagnosis not present

## 2021-08-27 DIAGNOSIS — M47812 Spondylosis without myelopathy or radiculopathy, cervical region: Secondary | ICD-10-CM | POA: Diagnosis not present

## 2021-08-27 DIAGNOSIS — M1991 Primary osteoarthritis, unspecified site: Secondary | ICD-10-CM | POA: Diagnosis not present

## 2021-09-03 ENCOUNTER — Ambulatory Visit: Payer: Medicare Other | Admitting: Adult Health

## 2021-09-08 ENCOUNTER — Ambulatory Visit: Payer: Medicare Other | Admitting: Adult Health

## 2021-09-14 ENCOUNTER — Ambulatory Visit (INDEPENDENT_AMBULATORY_CARE_PROVIDER_SITE_OTHER): Payer: Medicare Other | Admitting: Adult Health

## 2021-09-14 ENCOUNTER — Encounter: Payer: Self-pay | Admitting: Adult Health

## 2021-09-14 VITALS — BP 137/86 | HR 85 | Ht 61.0 in | Wt 168.0 lb

## 2021-09-14 DIAGNOSIS — Z4689 Encounter for fitting and adjustment of other specified devices: Secondary | ICD-10-CM | POA: Diagnosis not present

## 2021-09-14 DIAGNOSIS — N814 Uterovaginal prolapse, unspecified: Secondary | ICD-10-CM | POA: Diagnosis not present

## 2021-09-14 NOTE — Progress Notes (Signed)
  Subjective:     Patient ID: [name] [name], female   DOB: 04-17-1950, 71 y.o.   MRN: [id number]  HPI [name] [name] is a 71 year old white female, widowed, PM in for pessary maintenance. PCP is Dr Sherwood Gambler.  Review of Systems Denies any discharge, odor or bleeding Reviewed past medical,surgical, social and family history. Reviewed medications and allergies.     Objective:   Physical Exam BP 137/86 (BP Location: Left Arm, Patient Position: Sitting, Cuff Size: Normal)   Pulse 85   Ht 5\' 1"  (1.549 m)   Wt 168 lb (76.2 kg)   BMI 31.74 kg/m     Skin warm and dry.Pelvic: external genitalia is normal in appearance no lesions, vagina: pessary removed, # 5 milex ring with support,no discharge,no lesions in vagina; +cystocele and prolapse, pessary washed and dried and easily inserted,urethra has no lesions or masses noted, cervix:smooth and bulbous, uterus: normal size, shape and contour, non tender, no masses felt, adnexa: no masses or tenderness noted. Bladder is non tender and no masses felt. Fall risk is low, she does use walker  [facility] - 09/14/21 1008       Pregnancy Intention Screening   Does the patient want to become pregnant in the next year? N/A    Does the patient's partner want to become pregnant in the next year? N/A    Would the patient like to discuss contraceptive options today? N/A      Contraception Wrap Up   Current Method Abstinence   tubal   End Method Abstinence   tubal   Contraception Counseling Provided No            Examination chaperoned by 11/14/21 LPN  Assessment:     1. Pessary maintenance  2. Cystocele with uterine prolapse    Plan:     Return in 3 months for pessary maintenance or sooner if needed

## 2021-10-01 DIAGNOSIS — G894 Chronic pain syndrome: Secondary | ICD-10-CM | POA: Diagnosis not present

## 2021-10-01 DIAGNOSIS — E6609 Other obesity due to excess calories: Secondary | ICD-10-CM | POA: Diagnosis not present

## 2021-10-01 DIAGNOSIS — Z6836 Body mass index (BMI) 36.0-36.9, adult: Secondary | ICD-10-CM | POA: Diagnosis not present

## 2021-10-27 ENCOUNTER — Ambulatory Visit: Payer: Medicare Other | Admitting: Vascular Surgery

## 2021-10-28 ENCOUNTER — Ambulatory Visit: Payer: Medicare Other | Admitting: Vascular Surgery

## 2021-10-29 DIAGNOSIS — Z23 Encounter for immunization: Secondary | ICD-10-CM | POA: Diagnosis not present

## 2021-10-29 DIAGNOSIS — E6609 Other obesity due to excess calories: Secondary | ICD-10-CM | POA: Diagnosis not present

## 2021-10-29 DIAGNOSIS — M47812 Spondylosis without myelopathy or radiculopathy, cervical region: Secondary | ICD-10-CM | POA: Diagnosis not present

## 2021-10-29 DIAGNOSIS — Z6836 Body mass index (BMI) 36.0-36.9, adult: Secondary | ICD-10-CM | POA: Diagnosis not present

## 2021-10-29 DIAGNOSIS — G894 Chronic pain syndrome: Secondary | ICD-10-CM | POA: Diagnosis not present

## 2021-12-01 DIAGNOSIS — I10 Essential (primary) hypertension: Secondary | ICD-10-CM | POA: Diagnosis not present

## 2021-12-01 DIAGNOSIS — M47812 Spondylosis without myelopathy or radiculopathy, cervical region: Secondary | ICD-10-CM | POA: Diagnosis not present

## 2021-12-15 ENCOUNTER — Ambulatory Visit: Payer: Medicare Other | Admitting: Adult Health

## 2021-12-21 ENCOUNTER — Ambulatory Visit: Payer: Medicare Other | Admitting: Adult Health

## 2022-01-24 DIAGNOSIS — Z882 Allergy status to sulfonamides status: Secondary | ICD-10-CM | POA: Diagnosis not present

## 2022-01-24 DIAGNOSIS — N3001 Acute cystitis with hematuria: Secondary | ICD-10-CM | POA: Diagnosis not present

## 2022-01-24 DIAGNOSIS — Z20822 Contact with and (suspected) exposure to covid-19: Secondary | ICD-10-CM | POA: Diagnosis not present

## 2022-01-24 DIAGNOSIS — R059 Cough, unspecified: Secondary | ICD-10-CM | POA: Diagnosis not present

## 2022-01-29 ENCOUNTER — Emergency Department (HOSPITAL_COMMUNITY): Payer: Medicare Other

## 2022-01-29 ENCOUNTER — Inpatient Hospital Stay (HOSPITAL_COMMUNITY)
Admission: EM | Admit: 2022-01-29 | Discharge: 2022-01-31 | DRG: 444 | Disposition: A | Discharge status: Home / Self Care | Payer: Medicare Other | Attending: Internal Medicine | Admitting: Internal Medicine

## 2022-01-29 ENCOUNTER — Encounter (HOSPITAL_COMMUNITY): Payer: Self-pay | Admitting: *Deleted

## 2022-01-29 ENCOUNTER — Other Ambulatory Visit: Payer: Self-pay

## 2022-01-29 DIAGNOSIS — R7401 Elevation of levels of liver transaminase levels: Secondary | ICD-10-CM | POA: Diagnosis not present

## 2022-01-29 DIAGNOSIS — K449 Diaphragmatic hernia without obstruction or gangrene: Secondary | ICD-10-CM | POA: Diagnosis present

## 2022-01-29 DIAGNOSIS — E440 Moderate protein-calorie malnutrition: Secondary | ICD-10-CM | POA: Diagnosis present

## 2022-01-29 DIAGNOSIS — K8050 Calculus of bile duct without cholangitis or cholecystitis without obstruction: Secondary | ICD-10-CM | POA: Diagnosis not present

## 2022-01-29 DIAGNOSIS — K8033 Calculus of bile duct with acute cholangitis with obstruction: Secondary | ICD-10-CM | POA: Diagnosis not present

## 2022-01-29 DIAGNOSIS — R17 Unspecified jaundice: Secondary | ICD-10-CM | POA: Diagnosis not present

## 2022-01-29 DIAGNOSIS — K2970 Gastritis, unspecified, without bleeding: Secondary | ICD-10-CM | POA: Diagnosis not present

## 2022-01-29 DIAGNOSIS — E7220 Disorder of urea cycle metabolism, unspecified: Secondary | ICD-10-CM | POA: Diagnosis not present

## 2022-01-29 DIAGNOSIS — K222 Esophageal obstruction: Secondary | ICD-10-CM | POA: Diagnosis not present

## 2022-01-29 DIAGNOSIS — I10 Essential (primary) hypertension: Secondary | ICD-10-CM | POA: Diagnosis present

## 2022-01-29 DIAGNOSIS — K2950 Unspecified chronic gastritis without bleeding: Secondary | ICD-10-CM | POA: Diagnosis not present

## 2022-01-29 DIAGNOSIS — M069 Rheumatoid arthritis, unspecified: Secondary | ICD-10-CM | POA: Diagnosis present

## 2022-01-29 DIAGNOSIS — Z6831 Body mass index (BMI) 31.0-31.9, adult: Secondary | ICD-10-CM

## 2022-01-29 DIAGNOSIS — Z8262 Family history of osteoporosis: Secondary | ICD-10-CM

## 2022-01-29 DIAGNOSIS — K219 Gastro-esophageal reflux disease without esophagitis: Secondary | ICD-10-CM | POA: Diagnosis present

## 2022-01-29 DIAGNOSIS — Z79899 Other long term (current) drug therapy: Secondary | ICD-10-CM

## 2022-01-29 DIAGNOSIS — R5381 Other malaise: Secondary | ICD-10-CM | POA: Diagnosis not present

## 2022-01-29 DIAGNOSIS — R590 Localized enlarged lymph nodes: Secondary | ICD-10-CM | POA: Diagnosis not present

## 2022-01-29 DIAGNOSIS — Z820 Family history of epilepsy and other diseases of the nervous system: Secondary | ICD-10-CM

## 2022-01-29 DIAGNOSIS — K8031 Calculus of bile duct with cholangitis, unspecified, with obstruction: Secondary | ICD-10-CM | POA: Diagnosis not present

## 2022-01-29 DIAGNOSIS — Z823 Family history of stroke: Secondary | ICD-10-CM

## 2022-01-29 DIAGNOSIS — I700 Atherosclerosis of aorta: Secondary | ICD-10-CM | POA: Diagnosis not present

## 2022-01-29 DIAGNOSIS — Z881 Allergy status to other antibiotic agents status: Secondary | ICD-10-CM | POA: Diagnosis not present

## 2022-01-29 DIAGNOSIS — H409 Unspecified glaucoma: Secondary | ICD-10-CM | POA: Diagnosis present

## 2022-01-29 DIAGNOSIS — R531 Weakness: Secondary | ICD-10-CM | POA: Diagnosis not present

## 2022-01-29 DIAGNOSIS — Z1152 Encounter for screening for COVID-19: Secondary | ICD-10-CM | POA: Diagnosis not present

## 2022-01-29 DIAGNOSIS — E860 Dehydration: Secondary | ICD-10-CM | POA: Diagnosis present

## 2022-01-29 DIAGNOSIS — Z885 Allergy status to narcotic agent status: Secondary | ICD-10-CM | POA: Diagnosis not present

## 2022-01-29 DIAGNOSIS — K8309 Other cholangitis: Secondary | ICD-10-CM | POA: Diagnosis not present

## 2022-01-29 DIAGNOSIS — I959 Hypotension, unspecified: Secondary | ICD-10-CM | POA: Diagnosis not present

## 2022-01-29 DIAGNOSIS — E876 Hypokalemia: Secondary | ICD-10-CM | POA: Diagnosis not present

## 2022-01-29 DIAGNOSIS — G4733 Obstructive sleep apnea (adult) (pediatric): Secondary | ICD-10-CM | POA: Diagnosis present

## 2022-01-29 DIAGNOSIS — M797 Fibromyalgia: Secondary | ICD-10-CM | POA: Diagnosis present

## 2022-01-29 DIAGNOSIS — E871 Hypo-osmolality and hyponatremia: Secondary | ICD-10-CM

## 2022-01-29 DIAGNOSIS — G9341 Metabolic encephalopathy: Secondary | ICD-10-CM | POA: Diagnosis present

## 2022-01-29 DIAGNOSIS — E669 Obesity, unspecified: Secondary | ICD-10-CM | POA: Diagnosis present

## 2022-01-29 DIAGNOSIS — B9689 Other specified bacterial agents as the cause of diseases classified elsewhere: Secondary | ICD-10-CM | POA: Diagnosis present

## 2022-01-29 DIAGNOSIS — Z8261 Family history of arthritis: Secondary | ICD-10-CM

## 2022-01-29 DIAGNOSIS — K8042 Calculus of bile duct with acute cholecystitis without obstruction: Secondary | ICD-10-CM

## 2022-01-29 DIAGNOSIS — K838 Other specified diseases of biliary tract: Secondary | ICD-10-CM | POA: Diagnosis not present

## 2022-01-29 DIAGNOSIS — Z634 Disappearance and death of family member: Secondary | ICD-10-CM

## 2022-01-29 DIAGNOSIS — R7881 Bacteremia: Secondary | ICD-10-CM | POA: Diagnosis present

## 2022-01-29 DIAGNOSIS — E46 Unspecified protein-calorie malnutrition: Secondary | ICD-10-CM | POA: Insufficient documentation

## 2022-01-29 DIAGNOSIS — Z888 Allergy status to other drugs, medicaments and biological substances status: Secondary | ICD-10-CM | POA: Diagnosis not present

## 2022-01-29 DIAGNOSIS — E8809 Other disorders of plasma-protein metabolism, not elsewhere classified: Secondary | ICD-10-CM

## 2022-01-29 DIAGNOSIS — J45909 Unspecified asthma, uncomplicated: Secondary | ICD-10-CM | POA: Diagnosis present

## 2022-01-29 DIAGNOSIS — J9811 Atelectasis: Secondary | ICD-10-CM | POA: Diagnosis not present

## 2022-01-29 DIAGNOSIS — R748 Abnormal levels of other serum enzymes: Secondary | ICD-10-CM | POA: Diagnosis not present

## 2022-01-29 DIAGNOSIS — R000 Tachycardia, unspecified: Secondary | ICD-10-CM | POA: Diagnosis not present

## 2022-01-29 DIAGNOSIS — K429 Umbilical hernia without obstruction or gangrene: Secondary | ICD-10-CM | POA: Diagnosis not present

## 2022-01-29 DIAGNOSIS — F419 Anxiety disorder, unspecified: Secondary | ICD-10-CM | POA: Diagnosis not present

## 2022-01-29 DIAGNOSIS — R509 Fever, unspecified: Secondary | ICD-10-CM | POA: Diagnosis not present

## 2022-01-29 LAB — BLOOD GAS, VENOUS
Acid-Base Excess: 8.1 mmol/L — ABNORMAL HIGH (ref 0.0–2.0)
Bicarbonate: 30.2 mmol/L — ABNORMAL HIGH (ref 20.0–28.0)
Drawn by: 1517
FIO2: 21 %
O2 Saturation: 96.4 %
Patient temperature: 38.7
pCO2, Ven: 35 mmHg — ABNORMAL LOW (ref 44–60)
pH, Ven: 7.54 — ABNORMAL HIGH (ref 7.25–7.43)
pO2, Ven: 72 mmHg — ABNORMAL HIGH (ref 32–45)

## 2022-01-29 LAB — CBC WITH DIFFERENTIAL/PLATELET
Abs Immature Granulocytes: 0.33 10*3/uL — ABNORMAL HIGH (ref 0.00–0.07)
Basophils Absolute: 0.1 10*3/uL (ref 0.0–0.1)
Basophils Relative: 1 %
Eosinophils Absolute: 0 10*3/uL (ref 0.0–0.5)
Eosinophils Relative: 0 %
HCT: 35.3 % — ABNORMAL LOW (ref 36.0–46.0)
Hemoglobin: 12.4 g/dL (ref 12.0–15.0)
Immature Granulocytes: 3 %
Lymphocytes Relative: 5 %
Lymphs Abs: 0.5 10*3/uL — ABNORMAL LOW (ref 0.7–4.0)
MCH: 29.8 pg (ref 26.0–34.0)
MCHC: 35.1 g/dL (ref 30.0–36.0)
MCV: 84.9 fL (ref 80.0–100.0)
Monocytes Absolute: 0.6 10*3/uL (ref 0.1–1.0)
Monocytes Relative: 5 %
Neutro Abs: 10.1 10*3/uL — ABNORMAL HIGH (ref 1.7–7.7)
Neutrophils Relative %: 86 %
Platelets: 201 10*3/uL (ref 150–400)
RBC: 4.16 MIL/uL (ref 3.87–5.11)
RDW: 13.6 % (ref 11.5–15.5)
WBC: 11.6 10*3/uL — ABNORMAL HIGH (ref 4.0–10.5)
nRBC: 0 % (ref 0.0–0.2)

## 2022-01-29 LAB — COMPREHENSIVE METABOLIC PANEL
ALT: 52 U/L — ABNORMAL HIGH (ref 0–44)
AST: 62 U/L — ABNORMAL HIGH (ref 15–41)
Albumin: 2.8 g/dL — ABNORMAL LOW (ref 3.5–5.0)
Alkaline Phosphatase: 670 U/L — ABNORMAL HIGH (ref 38–126)
Anion gap: 10 (ref 5–15)
BUN: 14 mg/dL (ref 8–23)
CO2: 26 mmol/L (ref 22–32)
Calcium: 8.4 mg/dL — ABNORMAL LOW (ref 8.9–10.3)
Chloride: 95 mmol/L — ABNORMAL LOW (ref 98–111)
Creatinine, Ser: <0.3 mg/dL — ABNORMAL LOW (ref 0.44–1.00)
Glucose, Bld: 128 mg/dL — ABNORMAL HIGH (ref 70–99)
Potassium: 2.7 mmol/L — CL (ref 3.5–5.1)
Sodium: 131 mmol/L — ABNORMAL LOW (ref 135–145)
Total Bilirubin: 14.6 mg/dL — ABNORMAL HIGH (ref 0.3–1.2)
Total Protein: 7 g/dL (ref 6.5–8.1)

## 2022-01-29 LAB — URINALYSIS, ROUTINE W REFLEX MICROSCOPIC
Glucose, UA: 50 mg/dL — AB
Hgb urine dipstick: NEGATIVE
Ketones, ur: 20 mg/dL — AB
Leukocytes,Ua: NEGATIVE
Nitrite: POSITIVE — AB
Protein, ur: 30 mg/dL — AB
Specific Gravity, Urine: 1.02 (ref 1.005–1.030)
pH: 5 (ref 5.0–8.0)

## 2022-01-29 LAB — PROTIME-INR
INR: 1 (ref 0.8–1.2)
Prothrombin Time: 13 seconds (ref 11.4–15.2)

## 2022-01-29 LAB — RESP PANEL BY RT-PCR (RSV, FLU A&B, COVID)  RVPGX2
Influenza A by PCR: NEGATIVE
Influenza B by PCR: NEGATIVE
Resp Syncytial Virus by PCR: NEGATIVE
SARS Coronavirus 2 by RT PCR: NEGATIVE

## 2022-01-29 LAB — MAGNESIUM: Magnesium: 1.3 mg/dL — ABNORMAL LOW (ref 1.7–2.4)

## 2022-01-29 LAB — AMMONIA: Ammonia: 51 umol/L — ABNORMAL HIGH (ref 9–35)

## 2022-01-29 LAB — APTT: aPTT: 25 seconds (ref 24–36)

## 2022-01-29 LAB — LACTIC ACID, PLASMA
Lactic Acid, Venous: 1.1 mmol/L (ref 0.5–1.9)
Lactic Acid, Venous: 1.1 mmol/L (ref 0.5–1.9)

## 2022-01-29 LAB — LIPASE, BLOOD: Lipase: 60 U/L — ABNORMAL HIGH (ref 11–51)

## 2022-01-29 MED ORDER — LACTATED RINGERS IV SOLN
INTRAVENOUS | Status: DC
  Administered 2022-01-30: 1000 mL via INTRAVENOUS

## 2022-01-29 MED ORDER — SODIUM CHLORIDE 0.9 % IV SOLN
2.0000 g | INTRAVENOUS | Status: DC
  Administered 2022-01-30 – 2022-01-31 (×2): 2 g via INTRAVENOUS
  Filled 2022-01-29 (×2): qty 20

## 2022-01-29 MED ORDER — METRONIDAZOLE 500 MG/100ML IV SOLN
500.0000 mg | Freq: Once | INTRAVENOUS | Status: AC
  Administered 2022-01-29: 500 mg via INTRAVENOUS
  Filled 2022-01-29: qty 100

## 2022-01-29 MED ORDER — LACTULOSE 10 GM/15ML PO SOLN
20.0000 g | Freq: Three times a day (TID) | ORAL | Status: DC
  Administered 2022-01-29: 20 g via ORAL
  Filled 2022-01-29: qty 30

## 2022-01-29 MED ORDER — POTASSIUM CHLORIDE CRYS ER 20 MEQ PO TBCR
40.0000 meq | EXTENDED_RELEASE_TABLET | Freq: Once | ORAL | Status: AC
  Administered 2022-01-29: 40 meq via ORAL
  Filled 2022-01-29: qty 2

## 2022-01-29 MED ORDER — ENSURE ENLIVE PO LIQD
237.0000 mL | Freq: Two times a day (BID) | ORAL | Status: DC
  Administered 2022-01-30 – 2022-01-31 (×2): 237 mL via ORAL
  Filled 2022-01-29 (×4): qty 237

## 2022-01-29 MED ORDER — POTASSIUM CHLORIDE 10 MEQ/100ML IV SOLN
10.0000 meq | Freq: Once | INTRAVENOUS | Status: AC
  Administered 2022-01-29: 10 meq via INTRAVENOUS
  Filled 2022-01-29: qty 100

## 2022-01-29 MED ORDER — LACTATED RINGERS IV BOLUS (SEPSIS)
1000.0000 mL | Freq: Once | INTRAVENOUS | Status: AC
  Administered 2022-01-29: 1000 mL via INTRAVENOUS

## 2022-01-29 MED ORDER — MAGNESIUM SULFATE 2 GM/50ML IV SOLN
2.0000 g | Freq: Once | INTRAVENOUS | Status: AC
  Administered 2022-01-29: 2 g via INTRAVENOUS
  Filled 2022-01-29: qty 50

## 2022-01-29 MED ORDER — SODIUM CHLORIDE 0.9 % IV SOLN
2.0000 g | Freq: Once | INTRAVENOUS | Status: AC
  Administered 2022-01-29: 2 g via INTRAVENOUS
  Filled 2022-01-29: qty 20

## 2022-01-29 MED ORDER — PANTOPRAZOLE SODIUM 40 MG PO TBEC: 40.0000 mg | DELAYED_RELEASE_TABLET | Freq: Every day | ORAL | Status: DC

## 2022-01-29 MED ORDER — IOHEXOL 300 MG/ML  SOLN
100.0000 mL | Freq: Once | INTRAMUSCULAR | Status: AC | PRN
  Administered 2022-01-29: 100 mL via INTRAVENOUS

## 2022-01-29 MED ORDER — METRONIDAZOLE 500 MG/100ML IV SOLN
500.0000 mg | Freq: Two times a day (BID) | INTRAVENOUS | Status: DC
  Administered 2022-01-30: 500 mg via INTRAVENOUS
  Filled 2022-01-29: qty 100

## 2022-01-29 MED ORDER — POTASSIUM CHLORIDE 10 MEQ/100ML IV SOLN
10.0000 meq | INTRAVENOUS | Status: AC
  Administered 2022-01-29 – 2022-01-30 (×3): 10 meq via INTRAVENOUS
  Filled 2022-01-29 (×3): qty 100

## 2022-01-29 MED ORDER — IBUPROFEN 800 MG PO TABS
800.0000 mg | ORAL_TABLET | Freq: Once | ORAL | Status: AC
  Administered 2022-01-29: 800 mg via ORAL
  Filled 2022-01-29: qty 1

## 2022-01-29 MED ORDER — LACTATED RINGERS IV BOLUS (SEPSIS)
500.0000 mL | Freq: Once | INTRAVENOUS | Status: AC
  Administered 2022-01-29: 500 mL via INTRAVENOUS

## 2022-01-29 NOTE — ED Triage Notes (Signed)
Pt brought in by [facility] from home with c/o weakness and lethargy x few days. Family also noticed that pt was starting to become jaundiced. Pt was seen Monday for UTI and prolapsed uterus. Pt was given an antibiotic and has started taking it. Temp 100, BP 136/82, HR 102, O2 sat 97% on RA for EMS. 20g lt wrist by EMS.

## 2022-01-29 NOTE — Sepsis Progress Note (Signed)
Sepsis protocol is being followed by [facility]. 

## 2022-01-29 NOTE — ED Provider Notes (Signed)
[facility] EMERGENCY DEPARTMENT Provider Note   CSN: [id number] Arrival date & time: 01/29/22  1659     History  Chief Complaint  Patient presents with   Weakness    [name] [name] is a 70 y.o. female.  Pt is a 71 yo female with a pmhx significant for fibromyalgia, RA, HTN, glaucoma, OSA, asthma, and anxiety.  Family called EMS today because she's been weak and has been confused.  They noticed that she was jaundiced today.  Pt was seen in the [facility] ED on 12/11 for weakness and confusion.  She was diagnosed with a UTI and was d/c with omnicef.  Pt is a poor historian.        Home Medications Prior to Admission medications   Medication Sig Start Date End Date Taking? Authorizing Provider  butalbital-acetaminophen-caffeine (FIORICET) 50-325-40 MG tablet Take 1 tablet by mouth 4 (four) times daily as needed. 07/09/21   [provider]  diltiazem (CARDIZEM) 120 MG tablet Take 360 mg by mouth daily. 06/23/21   [provider]  fexofenadine (ALLEGRA) 180 MG tablet Take 180 mg by mouth daily.    [provider]  fluticasone (FLONASE) 50 MCG/ACT nasal spray Place 2 sprays into both nostrils daily. 03/24/21   [provider]  FOLIC ACID PO Take by mouth.    [provider]  furosemide (LASIX) 20 MG tablet Take 20 mg by mouth daily as needed. Patient not taking: Reported on 04/23/2021 02/19/21   [provider]  hydrOXYzine (ATARAX) 25 MG tablet Take 25-50 mg by mouth at bedtime. 04/04/21   [provider]  methocarbamol (ROBAXIN) 500 MG tablet Take by mouth. 08/29/17   [provider]  OMEPRAZOLE PO Take by mouth.    [provider]  OXYGEN Inhale 2 L into the lungs at bedtime.    [provider]  Probiotic Product (PROBIOTIC PO) Take by mouth.    [provider]      Allergies    Dolobid [diflunisal], Biaxin [clarithromycin], Lyrica [pregabalin], Other, Oxycontin [oxycodone], and Singulair  [montelukast]    Review of Systems   Review of Systems  Unable to perform ROS: Mental status change    Physical Exam Updated Vital Signs BP 109/71   Pulse 89   Temp 99.2 F (37.3 C) (Oral)   Resp 18   Ht 5\' 1"  (1.549 m)   Wt 76.2 kg   SpO2 95%   BMI 31.74 kg/m  Physical Exam Vitals and nursing note reviewed.  HENT:     Head: Normocephalic and atraumatic.     Right Ear: External ear normal.     Left Ear: External ear normal.     Nose: Nose normal.     Mouth/Throat:     Mouth: Mucous membranes are dry.  Eyes:     General: Scleral icterus present.     Extraocular Movements: Extraocular movements intact.     Conjunctiva/sclera: Conjunctivae normal.     Pupils: Pupils are equal, round, and reactive to light.  Cardiovascular:     Rate and Rhythm: Regular rhythm. Tachycardia present.     Pulses: Normal pulses.     Heart sounds: Normal heart sounds.  Pulmonary:     Effort: Pulmonary effort is normal.     Breath sounds: Normal breath sounds.  Abdominal:     General: Abdomen is flat. Bowel sounds are normal.     Palpations: Abdomen is soft.  Musculoskeletal:        General: Normal  range of motion.     Cervical back: Normal range of motion and neck supple.  Skin:    Capillary Refill: Capillary refill takes less than 2 seconds.     Coloration: Skin is jaundiced.  Neurological:     Mental Status: She is easily aroused. She is disoriented.  Psychiatric:     Comments: Unable to assess due to ms     ED Results / Procedures / Treatments   Labs (all labs ordered are listed, but only abnormal results are displayed) Labs Reviewed  COMPREHENSIVE METABOLIC PANEL - Abnormal; Notable for the following components:      Result Value   Sodium 131 (*)    Potassium 2.7 (*)    Chloride 95 (*)    Glucose, Bld 128 (*)    Creatinine, Ser <0.30 (*)    Calcium 8.4 (*)    Albumin 2.8 (*)    AST 62 (*)    ALT 52 (*)    Alkaline Phosphatase 670 (*)    Total Bilirubin 14.6 (*)     All other components within normal limits  CBC WITH DIFFERENTIAL/PLATELET - Abnormal; Notable for the following components:   WBC 11.6 (*)    HCT 35.3 (*)    Neutro Abs 10.1 (*)    Lymphs Abs 0.5 (*)    Abs Immature Granulocytes 0.33 (*)    All other components within normal limits  URINALYSIS, ROUTINE W REFLEX MICROSCOPIC - Abnormal; Notable for the following components:   Color, Urine AMBER (*)    Glucose, UA 50 (*)    Bilirubin Urine MODERATE (*)    Ketones, ur 20 (*)    Protein, ur 30 (*)    Nitrite POSITIVE (*)    Bacteria, UA FEW (*)    All other components within normal limits  AMMONIA - Abnormal; Notable for the following components:   Ammonia 51 (*)    All other components within normal limits  BLOOD GAS, VENOUS - Abnormal; Notable for the following components:   pH, Ven 7.54 (*)    pCO2, Ven 35 (*)    pO2, Ven 72 (*)    Bicarbonate 30.2 (*)    Acid-Base Excess 8.1 (*)    All other components within normal limits  MAGNESIUM - Abnormal; Notable for the following components:   Magnesium 1.3 (*)    All other components within normal limits  LIPASE, BLOOD - Abnormal; Notable for the following components:   Lipase 60 (*)    All other components within normal limits  RESP PANEL BY RT-PCR (RSV, FLU A&B, COVID)  RVPGX2  CULTURE, BLOOD (ROUTINE X 2)  CULTURE, BLOOD (ROUTINE X 2)  URINE CULTURE  LACTIC ACID, PLASMA  LACTIC ACID, PLASMA  PROTIME-INR  APTT    EKG EKG Interpretation  Date/Time:  Saturday January 29 2022 17:18:35 EST Ventricular Rate:  103 PR Interval:  230 QRS Duration: 98 QT Interval:  316 QTC Calculation: 414 R Axis:   -71 Text Interpretation: Sinus tachycardia Atrial premature complex Prolonged PR interval Inferior infarct, old Consider anterior infarct Baseline wander in lead(s) V1 duplicate Confirmed by Jacalyn Lefevre [phone]) on 01/29/2022 6:12:11 PM  Radiology CT CHEST ABDOMEN PELVIS W CONTRAST  Result Date: 01/29/2022 CLINICAL DATA:   Elevated LFTs, elevated bilirubin, fever.  Jaundice. EXAM: CT CHEST, ABDOMEN, AND PELVIS WITH CONTRAST TECHNIQUE: Multidetector CT imaging of the chest, abdomen and pelvis was performed following the standard protocol during bolus administration of intravenous contrast. RADIATION DOSE REDUCTION: This exam was  performed according to the departmental dose-optimization program which includes automated exposure control, adjustment of the mA and/or kV according to patient size and/or use of iterative reconstruction technique. CONTRAST:  OMNIPAQUE IOHEXOL 300 MG/ML  SOLN COMPARISON:  CT angio chest 02/20/2020 FINDINGS: CT CHEST FINDINGS Cardiovascular: Mitral valve calcifications. Aortic and coronary artery calcifications. Normal heart size. No pericardial effusion. Mediastinum/Nodes: A right cardiophrenic lymph node measures 0.7 cm short axis (series 2, image 35). No other enlarged mediastinal lymph nodes. A 1.1 cm short axis left hilar lymph node is unchanged compared to 02/20/2020 (series 2, image 28). No right hilar lymphadenopathy. No axillary lymphadenopathy. Thyroid gland and trachea demonstrate no significant abnormalities. Small hiatal hernia. Lungs/Pleura: Minimal subsegmental dependent atelectasis. Lungs are otherwise clear. No pleural effusion or pneumothorax. Musculoskeletal: No chest wall mass or suspicious bone lesions identified. Partially visualized anterior cervical spinal fixation. CT ABDOMEN PELVIS FINDINGS Hepatobiliary: No focal liver abnormality is seen. Status post cholecystectomy. The extrahepatic bile duct is markedly dilated, measuring 2.2 cm in diameter (series 2, image 61). There are 3 radiopaque stones in the distal aspect of the extrahepatic bile duct, measuring up to 1.3 cm (series 6, image 62). There is associated moderate-to-severe intrahepatic bile duct dilatation. Adjacent to the gallbladder fossa just inferior to the extrahepatic bile duct at the level of the porta hepatis, there  is a 4.4 x 1.2 cm cystic structure which contains 2 radiodensities which measure up to 0.7 cm (series 6, images 48, 52). Pancreas: Diffuse moderate parenchymal volume loss. The main pancreatic duct is not dilated. Spleen: Normal in size without focal abnormality. Adrenals/Urinary Tract: Adrenal glands are unremarkable. Kidneys are normal, without renal calculi, suspicious focal lesion, or hydronephrosis. Bladder is unremarkable. Stomach/Bowel: Small hiatal hernia. Appendix appears normal. Diverticulosis of the descending and sigmoid colon. Large fecal burden throughout the colon. No evidence of bowel wall thickening, distention, or inflammatory changes. Vascular/Lymphatic: Minimal aortic atherosclerosis. Enlarged nodes at the porta hepatis measure up to 1.7 cm short axis (series 2, image 56, 61). No other enlarged lymph nodes in the abdomen or pelvis. Reproductive: Uterus and bilateral adnexa are unremarkable. Pessary is in place. Other: Small fat containing umbilical hernia. Small bilateral fat containing inguinal hernias. Musculoskeletal: No acute or suspicious osseous findings. 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 IMPRESSION: CT CHEST: 1. No acute findings in the chest. 2. Indeterminate 0.7 cm right cardiophrenic lymph node. Recommend attention on follow-up imaging. CT ABDOMEN AND PELVIS: 1. Choledocholithiasis with resulting marked extrahepatic and intrahepatic bile duct dilatation. 2. A 4.4 cm fluid density structure containing 2 additional stones is noted at the level of the porta hepatis, possibly representing gallbladder remnant given history of prior cholecystectomy. 3. Enlarged porta hepatis nodes are favored to be reactive in etiology. 4. Other incidental findings as described in the body of the report. Electronically Signed   By: Sherron Ales M.D.   On: 01/29/2022 20:17   DG  Chest Port 1 View  Result Date: 01/29/2022 CLINICAL DATA:  Weakness and lethargy EXAM: PORTABLE CHEST 1 VIEW COMPARISON:  02/19/2020, CT 02/20/2020, 12/31/2016 FINDINGS: Low lung volumes. No consolidation, pleural effusion or pneumothorax. Borderline cardiomegaly. Enlarged upper mediastinal silhouette. IMPRESSION: 1. No acute airspace disease 2. Low lung volumes with cardiomegaly. Enlargement of the upper mediastinal silhouette, likely augmented by marked patient rotation and portable technique, but if concern for acute mediastinal pathology, correlation with chest CT should  be considered. Electronically Signed   By: Jasmine Pang M.D.   On: 01/29/2022 18:10    Procedures Procedures    Medications Ordered in ED Medications  lactated ringers infusion ( Intravenous New Bag/Given 01/29/22 1750)  lactated ringers bolus 1,000 mL (0 mLs Intravenous Stopped 01/29/22 2102)    And  lactated ringers bolus 1,000 mL (0 mLs Intravenous Stopped 01/29/22 2102)    And  lactated ringers bolus 500 mL (0 mLs Intravenous Stopped 01/29/22 2102)  cefTRIAXone (ROCEPHIN) 2 g in sodium chloride 0.9 % 100 mL IVPB (0 g Intravenous Stopped 01/29/22 1827)  metroNIDAZOLE (FLAGYL) IVPB 500 mg (0 mg Intravenous Stopped 01/29/22 1901)  ibuprofen (ADVIL) tablet 800 mg (800 mg Oral Given 01/29/22 1801)  potassium chloride 10 mEq in 100 mL IVPB (0 mEq Intravenous Stopped 01/29/22 2032)  iohexol (OMNIPAQUE) 300 MG/ML solution 100 mL (100 mLs Intravenous Contrast Given 01/29/22 1918)  magnesium sulfate IVPB 2 g 50 mL (0 g Intravenous Stopped 01/29/22 2212)    ED Course/ Medical Decision Making/ A&P                           Medical Decision Making Amount and/or Complexity of Data Reviewed Labs: ordered. Radiology: ordered. ECG/medicine tests: ordered.  Risk Prescription drug management. Decision regarding hospitalization.   This patient presents to the ED for concern of ams, this involves an extensive number of  treatment options, and is a complaint that carries with it a high risk of complications and morbidity.  The differential diagnosis includes infection, sepsis, liver failure   Co morbidities that complicate the patient evaluation  fibromyalgia, RA, HTN, glaucoma, OSA, asthma, and anxiety   Additional history obtained:  Additional history obtained from epic chart review External records from outside source obtained and reviewed including EMS report   Lab Tests:  I Ordered, and personally interpreted labs.  The pertinent results include:  cbc with wbc elevated at 11.6; cmp with k low at 2.7; albumin low at 2.8, total bilirubin 14.6; ammonia elevated at 51; lactic nl at 1.1; inr 1.0; covid/flu/rsv neg   Imaging Studies ordered:  I ordered imaging studies including cxr and ct abd/pelvis  I independently visualized and interpreted imaging which showed  CXR: 1. No acute airspace disease  2. Low lung volumes with cardiomegaly. Enlargement of the upper  mediastinal silhouette, likely augmented by marked patient rotation  and portable technique, but if concern for acute mediastinal  pathology, correlation with chest CT should be considered.  CT chest/abd/pelvis: CT CHEST:    1. No acute findings in the chest.  2. Indeterminate 0.7 cm right cardiophrenic lymph node. Recommend  attention on follow-up imaging.    CT ABDOMEN AND PELVIS:    1. Choledocholithiasis with resulting marked extrahepatic and  intrahepatic bile duct dilatation.  2. A 4.4 cm fluid density structure containing 2 additional stones  is noted at the level of the porta hepatis, possibly representing  gallbladder remnant given history of prior cholecystectomy.  3. Enlarged porta hepatis nodes are favored to be reactive in  etiology.  4. Other incidental findings as described in the body of the report.   I agree with the radiologist interpretation   Cardiac Monitoring:  The patient was maintained on a cardiac  monitor.  I personally viewed and interpreted the cardiac monitored which showed an underlying rhythm of: st   Medicines ordered and prescription drug management:  I ordered medication including ibuprofen  for fever;  abx for sepsis Reevaluation of the patient after these medicines showed that the patient improved I have reviewed the patients home medicines and have made adjustments as needed   Test Considered:  ct   Critical Interventions:  abx   Consultations Obtained:  I requested consultation with the hospitalist (Dr. Thomes Dinning),  and discussed lab and imaging findings as well as pertinent plan -he asked that I speak with GI.  Pt d/w Dr. Levon Hedger ([facility]) who does not do ERCPs.  Pt d/w Dr. Ewing Schlein [facility] at [facility]) who will see pt in consult in the am.  Dr. Levora Angel recommended admission to [facility].  Dr. Meridee Score is on call for ERCP for both groups.   Dr. Thomes Dinning will admit to [facility]   Problem List / ED Course:  Choledocholithiasis with cholangitis:  IV abx given; sepsis fluids given.  Pt is more alert and MS is back to baseline. Hypokalemia and hypomagnesemia:  electrolytes replaced   Reevaluation:  After the interventions noted above, I reevaluated the patient and found that they have :improved   Social Determinants of Health:  Lives at home   Dispostion:  After consideration of the diagnostic results and the patients response to treatment, I feel that the patent would benefit from admission.    CRITICAL CARE Performed by: Jacalyn Lefevre   Total critical care time: 30 minutes  Critical care time was exclusive of separately billable procedures and treating other patients.  Critical care was necessary to treat or prevent imminent or life-threatening deterioration.  Critical care was time spent personally by me on the following activities: development of treatment plan with patient and/or surrogate as well as nursing, discussions with consultants, evaluation of  patient's response to treatment, examination of patient, obtaining history from patient or surrogate, ordering and performing treatments and interventions, ordering and review of laboratory studies, ordering and review of radiographic studies, pulse oximetry and re-evaluation of patient's condition.         Final Clinical Impression(s) / ED Diagnoses Final diagnoses:  Cholangitis  Choledocholithiasis  Hypokalemia  Hypomagnesemia    Rx / DC Orders ED Discharge Orders     None         Jacalyn Lefevre, MD 01/29/22 2222

## 2022-01-29 NOTE — H&P (Addendum)
History and Physical    Patient: [name] [name] DDU:[id number] DOB: 03-10-1950 DOA: 01/29/2022 DOS: the patient was seen and examined on 01/29/2022 PCP: [name], MD  Patient coming from: Home  Chief Complaint:  Chief Complaint  Patient presents with   Weakness   HPI: [name] [name] is a 71 y.o. female with medical history significant of hypertension, fibromyalgia, glaucoma who presents to the emergency department via EMS due to noticing the patient was jaundiced.  Patient went to [facility] ED on 12/11 due to weakness and confusion, she was diagnosed to have UTI and was prescribed with [facility] on discharge from the ED.  She felt slightly better, but on Thursday (12/14), she started to complain of right upper quadrant pain which was dull in nature and was rated as 8/10 on pain scale, pain was intermittent and alleviated by laying still, but worse with ambulation.  Yesterday (12/15), family started to notice change in skin color which was suspected to be jaundice, today she was noted with worsening jaundice, so EMS was activated and patient was taken to the ED for further evaluation and management.  ED Course:  In the emergency department, she was noted to be febrile with a temperature of 101.77F, respiration 18/min, pulse 100 bpm, BP 130/83, O2 sat 95%.  Workup in the ED showed WBC of 11.6 with a left shift and normocytic anemia.  Ammonia level was 51, BMP showed sodium of 131, potassium 2.7, chloride 95, bicarb 26, glucose 128, BUN 14, creatinine < 0.30, albumin 2.8, AST 62, ALT 52, alkaline phosphatase 670, total bilirubin 14.6, lipase 60, magnesium 1.3, lactic acid was flat at 1.1.  Urinalysis was positive for nitrite, moderate leukocyte esterase, bilirubinuria and moderate bacteria.  Influenza A, B, SARS coronavirus 2 was negative.  Blood culture pending. CT chest with contrast showed no acute findings in the chest. CT abdomen and pelvis with contrast showed choledocholithiasis with  resultant mild extrahepatic and intrahepatic bile duct dilatation. A 4.4 cm fluid density structure containing 2 additional stones is noted at the level of the porta hepatis, possibly representing gallbladder remnant given history of prior cholecystectomy. Patient was treated with IV ceftriaxone and metronidazole.  Magnesium and potassium were replenished, IV hydration was provided.  Gastroenterologist on-call (Dr. Ewing Schlein) was consulted regarding possibility for ERCP and it was agreed the patient could be [facility] to [facility] and the gastroenterology will follow-up on patient on arrival to [facility] per EDP.  Hospitalist was asked to admit patient for further evaluation and management.  Review of Systems: Review of systems as noted in the HPI. All other systems reviewed and are negative.   Past Medical History:  Diagnosis Date   Anxiety    Asthma    Ectopic pregnancy    Fibromyalgia    Glaucoma    Hypertension    OSA (obstructive sleep apnea)    o2 at 2.5 liters at night   Rheumatoid arthritis(714.0)    Past Surgical History:  Procedure Laterality Date   BACK SURGERY     CATARACT EXTRACTION     CATARACT EXTRACTION W/PHACO  01/16/2012   Procedure: CATARACT EXTRACTION PHACO AND INTRAOCULAR LENS PLACEMENT (IOC);  Surgeon: Susa Simmonds, MD;  Location: [facility];  Service: Ophthalmology;  Laterality: Right;  CDE: 14.05   CERVICAL LAMINECTOMY     with plating   CHOLECYSTECTOMY     ECTOPIC PREGNANCY SURGERY     KNEE ARTHROSCOPY     TUBAL LIGATION     vericose vein surgery  Social History:  reports that she has never smoked. She has never used smokeless tobacco. She reports that she does not drink alcohol and does not use drugs.   Allergies  Allergen Reactions   Dolobid [Diflunisal]    Biaxin [Clarithromycin] Nausea And Vomiting   Lyrica [Pregabalin] Swelling   Other Nausea And Vomiting    Vicoprofen   Oxycontin [Oxycodone] Nausea And Vomiting   Singulair [Montelukast] Nausea And  Vomiting    Family History  Problem Relation Age of Onset   Stroke Maternal Grandmother    Parkinson's disease Father    Arthritis Mother    Osteoporosis Mother    Fibromyalgia Mother    Parkinson's disease Brother     Prior to Admission medications   Medication Sig Start Date End Date Taking? Authorizing Provider  butalbital-acetaminophen-caffeine (FIORICET) 50-325-40 MG tablet Take 1 tablet by mouth 4 (four) times daily as needed. 07/09/21   [provider]  diltiazem (CARDIZEM) 120 MG tablet Take 360 mg by mouth daily. 06/23/21   [provider]  fexofenadine (ALLEGRA) 180 MG tablet Take 180 mg by mouth daily.    [provider]  fluticasone (FLONASE) 50 MCG/ACT nasal spray Place 2 sprays into both nostrils daily. 03/24/21   [provider]  FOLIC ACID PO Take by mouth.    [provider]  furosemide (LASIX) 20 MG tablet Take 20 mg by mouth daily as needed. Patient not taking: Reported on 04/23/2021 02/19/21   [provider]  hydrOXYzine (ATARAX) 25 MG tablet Take 25-50 mg by mouth at bedtime. 04/04/21   [provider]  methocarbamol (ROBAXIN) 500 MG tablet Take by mouth. 08/29/17   [provider]  OMEPRAZOLE PO Take by mouth.    [provider]  OXYGEN Inhale 2 L into the lungs at bedtime.    [provider]  Probiotic Product (PROBIOTIC PO) Take by mouth.    [provider]    Physical Exam: BP 109/71   Pulse 89   Temp 99.2 F (37.3 C) (Oral)   Resp 18   Ht 5\' 1"  (1.549 m)   Wt 76.2 kg   SpO2 95%   BMI 31.74 kg/m   General: 71 y.o. year-old female well developed well nourished in no acute distress.  Alert and oriented x3. HEENT: NCAT, EOMI, sclera icterus Neck: Supple, trachea medial Cardiovascular: Tachycardia.  Regular rate and rhythm with no rubs or gallops.  No thyromegaly or JVD noted.  No lower extremity edema. 2/4 pulses in all 4 extremities. Respiratory: Clear to  auscultation with no wheezes or rales. Good inspiratory effort. Abdomen: Soft, tender to palpation of RUQ without guarding. Normal bowel sounds x4 quadrants. Muskuloskeletal: No cyanosis, clubbing or edema noted bilaterally Neuro: CN II-XII intact, strength 5/5 x 4, sensation, reflexes intact Skin: No ulcerative lesions noted or rashes Psychiatry: Judgement and insight appear normal. Mood is appropriate for condition and setting          Labs on Admission:  Basic Metabolic Panel: Recent Labs  Lab 01/29/22 1746 01/29/22 2001  NA 131*  --   K 2.7*  --   CL 95*  --   CO2 26  --   GLUCOSE 128*  --   BUN 14  --   CREATININE <0.30*  --   CALCIUM 8.4*  --   MG  --  1.3*   Liver Function Tests: Recent Labs  Lab 01/29/22 1746  AST 62*  ALT 52*  ALKPHOS 670*  BILITOT  14.6*  PROT 7.0  ALBUMIN 2.8*   Recent Labs  Lab 01/29/22 2001  LIPASE 60*   Recent Labs  Lab 01/29/22 1746  AMMONIA 51*   CBC: Recent Labs  Lab 01/29/22 1746  WBC 11.6*  NEUTROABS 10.1*  HGB 12.4  HCT 35.3*  MCV 84.9  PLT 201   Cardiac Enzymes: No results for input(s): "CKTOTAL", "CKMB", "CKMBINDEX", "TROPONINI" in the last 168 hours.  BNP (last 3 results) No results for input(s): "BNP" in the last 8760 hours.  ProBNP (last 3 results) No results for input(s): "PROBNP" in the last 8760 hours.  CBG: No results for input(s): "GLUCAP" in the last 168 hours.  Radiological Exams on Admission: CT CHEST ABDOMEN PELVIS W CONTRAST  Result Date: 01/29/2022 CLINICAL DATA:  Elevated LFTs, elevated bilirubin, fever.  Jaundice. EXAM: CT CHEST, ABDOMEN, AND PELVIS WITH CONTRAST TECHNIQUE: Multidetector CT imaging of the chest, abdomen and pelvis was performed following the standard protocol during bolus administration of intravenous contrast. RADIATION DOSE REDUCTION: This exam was performed according to the departmental dose-optimization program which includes automated exposure control, adjustment of the  mA and/or kV according to patient size and/or use of iterative reconstruction technique. CONTRAST:  OMNIPAQUE IOHEXOL 300 MG/ML  SOLN COMPARISON:  CT angio chest 02/20/2020 FINDINGS: CT CHEST FINDINGS Cardiovascular: Mitral valve calcifications. Aortic and coronary artery calcifications. Normal heart size. No pericardial effusion. Mediastinum/Nodes: A right cardiophrenic lymph node measures 0.7 cm short axis (series 2, image 35). No other enlarged mediastinal lymph nodes. A 1.1 cm short axis left hilar lymph node is unchanged compared to 02/20/2020 (series 2, image 28). No right hilar lymphadenopathy. No axillary lymphadenopathy. Thyroid gland and trachea demonstrate no significant abnormalities. Small hiatal hernia. Lungs/Pleura: Minimal subsegmental dependent atelectasis. Lungs are otherwise clear. No pleural effusion or pneumothorax. Musculoskeletal: No chest wall mass or suspicious bone lesions identified. Partially visualized anterior cervical spinal fixation. CT ABDOMEN PELVIS FINDINGS Hepatobiliary: No focal liver abnormality is seen. Status post cholecystectomy. The extrahepatic bile duct is markedly dilated, measuring 2.2 cm in diameter (series 2, image 61). There are 3 radiopaque stones in the distal aspect of the extrahepatic bile duct, measuring up to 1.3 cm (series 6, image 62). There is associated moderate-to-severe intrahepatic bile duct dilatation. Adjacent to the gallbladder fossa just inferior to the extrahepatic bile duct at the level of the porta hepatis, there is a 4.4 x 1.2 cm cystic structure which contains 2 radiodensities which measure up to 0.7 cm (series 6, images 48, 52). Pancreas: Diffuse moderate parenchymal volume loss. The main pancreatic duct is not dilated. Spleen: Normal in size without focal abnormality. Adrenals/Urinary Tract: Adrenal glands are unremarkable. Kidneys are normal, without renal calculi, suspicious focal lesion, or hydronephrosis. Bladder is unremarkable.  Stomach/Bowel: Small hiatal hernia. Appendix appears normal. Diverticulosis of the descending and sigmoid colon. Large fecal burden throughout the colon. No evidence of bowel wall thickening, distention, or inflammatory changes. Vascular/Lymphatic: Minimal aortic atherosclerosis. Enlarged nodes at the porta hepatis measure up to 1.7 cm short axis (series 2, image 56, 61). No other enlarged lymph nodes in the abdomen or pelvis. Reproductive: Uterus and bilateral adnexa are unremarkable. Pessary is in place. Other: Small fat containing umbilical hernia. Small bilateral fat containing inguinal hernias. Musculoskeletal: No acute or suspicious osseous findings. 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 IMPRESSION: CT CHEST: 1. No acute findings in the chest. 2. Indeterminate  0.7 cm right cardiophrenic lymph node. Recommend attention on follow-up imaging. CT ABDOMEN AND PELVIS: 1. Choledocholithiasis with resulting marked extrahepatic and intrahepatic bile duct dilatation. 2. A 4.4 cm fluid density structure containing 2 additional stones is noted at the level of the porta hepatis, possibly representing gallbladder remnant given history of prior cholecystectomy. 3. Enlarged porta hepatis nodes are favored to be reactive in etiology. 4. Other incidental findings as described in the body of the report. Electronically Signed   By: Sherron Ales M.D.   On: 01/29/2022 20:17   DG Chest Port 1 View  Result Date: 01/29/2022 CLINICAL DATA:  Weakness and lethargy EXAM: PORTABLE CHEST 1 VIEW COMPARISON:  02/19/2020, CT 02/20/2020, 12/31/2016 FINDINGS: Low lung volumes. No consolidation, pleural effusion or pneumothorax. Borderline cardiomegaly. Enlarged upper mediastinal silhouette. IMPRESSION: 1. No acute airspace disease 2. Low lung volumes with cardiomegaly. Enlargement of the upper mediastinal silhouette,  likely augmented by marked patient rotation and portable technique, but if concern for acute mediastinal pathology, correlation with chest CT should be considered. Electronically Signed   By: Jasmine Pang M.D.   On: 01/29/2022 18:10    EKG: I independently viewed the EKG done and my findings are as followed: Sinus tachycardia at a rate of 103 bpm with APCs  Assessment/Plan Present on Admission:  Hypokalemia  Principal Problem:   Choledocholithiasis with acute cholecystitis Active Problems:   Hypokalemia   Hyperammonemia (HCC)   Hyponatremia   Hypomagnesemia   Hypoalbuminemia due to protein-calorie malnutrition (HCC)   Transaminitis   Elevated lipase   Obesity (BMI 30-39.9)  Acute choledocholithiasis with possible cholangitis CT abdomen and pelvis was suggestive of acute choledocholithiasis ALP was significantly elevated at 670 Patient was febrile and could possibly have superimposed cholangitis She was started on IV ceftriaxone and metronidazole, we shall continue with same at this time Continue IV hydration Gastroenterologist was consulted for possible ERCP and patient will be admitted to [facility] with plan for gastroenterology to consult on patient on arrival to [facility].  Hyperammonemia Ammonia 51, continue lactulose  Transaminitis/hyperbilirubinemia AST 62, ALT 52, ALP 670, total bilirubin 14.6 This is possibly secondary to choledocholithiasis Gastroenterologist was consulted for possible ERCP  Elevated lipase Lipase 60, no CT evidence of acute pancreatitis Continue to monitor lipase level  Hyponatremia Sodium 131, continue IV hydration  Hypokalemia K+ 2.7, this will be replenished  Hypomagnesemia Magnesium 1.3, this will be replenished  Hypoalbuminemia possibly secondary to moderate protein calorie malnutrition Albumin 2.8, protein supplement to be provided  Essential hypertension (controlled) BP meds will be temporarily held at this time due to soft  BP  GERD Continue Protonix  DVT prophylaxis: SCDs  Code Status: Full code  Family Communication: Son and daughter-in-law at bedside (all questions answered to satisfaction)  Consults: Gastroenterology per EDP  Severity of Illness: The appropriate patient status for this patient is INPATIENT. Inpatient status is judged to be reasonable and necessary in order to provide the required intensity of service to ensure the patient's safety. The patient's presenting symptoms, physical exam findings, and initial radiographic and laboratory data in the context of their chronic comorbidities is felt to place them at high risk for further clinical deterioration. Furthermore, it is not anticipated that the patient will be medically stable for discharge from the hospital within 2 midnights of admission.   * I certify that at the point of admission it is my clinical judgment that the patient will require inpatient hospital care spanning beyond 2 midnights from the point of  admission due to high intensity of service, high risk for further deterioration and high frequency of surveillance required.*  Author: Frankey Shown, DO 01/29/2022 11:20 PM  For on call review [email or web address].

## 2022-01-30 ENCOUNTER — Inpatient Hospital Stay (HOSPITAL_COMMUNITY): Payer: Medicare Other | Admitting: Anesthesiology

## 2022-01-30 ENCOUNTER — Inpatient Hospital Stay (HOSPITAL_COMMUNITY): Payer: Medicare Other

## 2022-01-30 ENCOUNTER — Encounter (HOSPITAL_COMMUNITY): Payer: Self-pay | Admitting: Internal Medicine

## 2022-01-30 ENCOUNTER — Telehealth: Payer: Self-pay

## 2022-01-30 ENCOUNTER — Encounter (HOSPITAL_COMMUNITY)
Admission: EM | Disposition: A | Discharge status: Home / Self Care | Payer: Self-pay | Source: Home / Self Care | Attending: Internal Medicine

## 2022-01-30 DIAGNOSIS — F419 Anxiety disorder, unspecified: Secondary | ICD-10-CM

## 2022-01-30 DIAGNOSIS — K8050 Calculus of bile duct without cholangitis or cholecystitis without obstruction: Secondary | ICD-10-CM

## 2022-01-30 DIAGNOSIS — K8031 Calculus of bile duct with cholangitis, unspecified, with obstruction: Secondary | ICD-10-CM

## 2022-01-30 DIAGNOSIS — J45909 Unspecified asthma, uncomplicated: Secondary | ICD-10-CM

## 2022-01-30 DIAGNOSIS — R17 Unspecified jaundice: Secondary | ICD-10-CM

## 2022-01-30 DIAGNOSIS — K222 Esophageal obstruction: Secondary | ICD-10-CM

## 2022-01-30 DIAGNOSIS — K8309 Other cholangitis: Principal | ICD-10-CM

## 2022-01-30 DIAGNOSIS — K8042 Calculus of bile duct with acute cholecystitis without obstruction: Secondary | ICD-10-CM

## 2022-01-30 DIAGNOSIS — K2970 Gastritis, unspecified, without bleeding: Secondary | ICD-10-CM

## 2022-01-30 DIAGNOSIS — I10 Essential (primary) hypertension: Secondary | ICD-10-CM

## 2022-01-30 HISTORY — PX: BILIARY STENT PLACEMENT: SHX5538

## 2022-01-30 HISTORY — PX: REMOVAL OF STONES: SHX5545

## 2022-01-30 HISTORY — PX: STONE EXTRACTION WITH BASKET: SHX5318

## 2022-01-30 HISTORY — PX: BILIARY DILATION: SHX6850

## 2022-01-30 HISTORY — PX: SPHINCTEROTOMY: SHX5279

## 2022-01-30 HISTORY — PX: ERCP: SHX5425

## 2022-01-30 HISTORY — PX: BIOPSY: SHX5522

## 2022-01-30 LAB — COMPREHENSIVE METABOLIC PANEL
ALT: 41 U/L (ref 0–44)
AST: 44 U/L — ABNORMAL HIGH (ref 15–41)
Albumin: 2.4 g/dL — ABNORMAL LOW (ref 3.5–5.0)
Alkaline Phosphatase: 561 U/L — ABNORMAL HIGH (ref 38–126)
Anion gap: 8 (ref 5–15)
BUN: 11 mg/dL (ref 8–23)
CO2: 27 mmol/L (ref 22–32)
Calcium: 8.4 mg/dL — ABNORMAL LOW (ref 8.9–10.3)
Chloride: 103 mmol/L (ref 98–111)
Creatinine, Ser: <0.3 mg/dL — ABNORMAL LOW (ref 0.44–1.00)
Glucose, Bld: 116 mg/dL — ABNORMAL HIGH (ref 70–99)
Potassium: 3.5 mmol/L (ref 3.5–5.1)
Sodium: 138 mmol/L (ref 135–145)
Total Bilirubin: 12 mg/dL — ABNORMAL HIGH (ref 0.3–1.2)
Total Protein: 6 g/dL — ABNORMAL LOW (ref 6.5–8.1)

## 2022-01-30 LAB — BLOOD CULTURE ID PANEL (REFLEXED) - BCID2

## 2022-01-30 LAB — CBC
HCT: 32.8 % — ABNORMAL LOW (ref 36.0–46.0)
Hemoglobin: 11.4 g/dL — ABNORMAL LOW (ref 12.0–15.0)
MCH: 29.8 pg (ref 26.0–34.0)
MCHC: 34.8 g/dL (ref 30.0–36.0)
MCV: 85.9 fL (ref 80.0–100.0)
Platelets: 216 10*3/uL (ref 150–400)
RBC: 3.82 MIL/uL — ABNORMAL LOW (ref 3.87–5.11)
RDW: 13.9 % (ref 11.5–15.5)
WBC: 10.9 10*3/uL — ABNORMAL HIGH (ref 4.0–10.5)
nRBC: 0 % (ref 0.0–0.2)

## 2022-01-30 LAB — URINE CULTURE: Culture: NO GROWTH

## 2022-01-30 LAB — ABO/RH: ABO/RH(D): A POS

## 2022-01-30 LAB — MAGNESIUM: Magnesium: 2.3 mg/dL (ref 1.7–2.4)

## 2022-01-30 LAB — PROTIME-INR
INR: 1.1 (ref 0.8–1.2)
Prothrombin Time: 14.1 seconds (ref 11.4–15.2)

## 2022-01-30 LAB — PHOSPHORUS: Phosphorus: 3 mg/dL (ref 2.5–4.6)

## 2022-01-30 SURGERY — ERCP, WITH INTERVENTION IF INDICATED
Anesthesia: General

## 2022-01-30 MED ORDER — ROCURONIUM BROMIDE 10 MG/ML (PF) SYRINGE
PREFILLED_SYRINGE | INTRAVENOUS | Status: DC | PRN
  Administered 2022-01-30: 10 mg via INTRAVENOUS
  Administered 2022-01-30: 60 mg via INTRAVENOUS

## 2022-01-30 MED ORDER — DILTIAZEM HCL 90 MG PO TABS
240.0000 mg | ORAL_TABLET | Freq: Every day | ORAL | Status: DC
  Administered 2022-01-30: 240 mg via ORAL
  Filled 2022-01-30 (×2): qty 1

## 2022-01-30 MED ORDER — SODIUM CHLORIDE 0.9 % IV SOLN
INTRAVENOUS | Status: DC | PRN
  Administered 2022-01-30: 50 mL

## 2022-01-30 MED ORDER — MELATONIN 3 MG PO TABS: 3.0000 mg | ORAL_TABLET | Freq: Every evening | ORAL | Status: DC | PRN

## 2022-01-30 MED ORDER — DEXAMETHASONE SODIUM PHOSPHATE 10 MG/ML IJ SOLN
INTRAMUSCULAR | Status: DC | PRN
  Administered 2022-01-30: 10 mg via INTRAVENOUS

## 2022-01-30 MED ORDER — DILTIAZEM HCL 90 MG PO TABS: 180.0000 mg | ORAL_TABLET | Freq: Every day | ORAL | Status: DC

## 2022-01-30 MED ORDER — PANTOPRAZOLE SODIUM 40 MG PO TBEC
40.0000 mg | DELAYED_RELEASE_TABLET | Freq: Every day | ORAL | Status: DC
  Administered 2022-01-30 – 2022-01-31 (×2): 40 mg via ORAL
  Filled 2022-01-30 (×2): qty 1

## 2022-01-30 MED ORDER — SUGAMMADEX SODIUM 200 MG/2ML IV SOLN
INTRAVENOUS | Status: DC | PRN
  Administered 2022-01-30: 200 mg via INTRAVENOUS

## 2022-01-30 MED ORDER — FENTANYL CITRATE (PF) 250 MCG/5ML IJ SOLN
INTRAMUSCULAR | Status: DC | PRN
  Administered 2022-01-30: 100 ug via INTRAVENOUS

## 2022-01-30 MED ORDER — CIPROFLOXACIN IN D5W 400 MG/200ML IV SOLN
INTRAVENOUS | Status: AC
  Filled 2022-01-30: qty 200

## 2022-01-30 MED ORDER — DILTIAZEM HCL 25 MG/5ML IV SOLN: 10.0000 mg | Freq: Four times a day (QID) | INTRAVENOUS | Status: DC | PRN

## 2022-01-30 MED ORDER — GLUCAGON HCL RDNA (DIAGNOSTIC) 1 MG IJ SOLR
INTRAMUSCULAR | Status: AC
  Filled 2022-01-30: qty 1

## 2022-01-30 MED ORDER — PHENYLEPHRINE HCL-NACL 20-0.9 MG/250ML-% IV SOLN
INTRAVENOUS | Status: DC | PRN
  Administered 2022-01-30: 50 ug/min via INTRAVENOUS

## 2022-01-30 MED ORDER — LACTATED RINGERS IV SOLN: INTRAVENOUS | Status: AC

## 2022-01-30 MED ORDER — PROPOFOL 10 MG/ML IV BOLUS
INTRAVENOUS | Status: DC | PRN
  Administered 2022-01-30: 10 mg via INTRAVENOUS
  Administered 2022-01-30: 100 mg via INTRAVENOUS

## 2022-01-30 MED ORDER — ONDANSETRON HCL 4 MG/2ML IJ SOLN
INTRAMUSCULAR | Status: DC | PRN
  Administered 2022-01-30: 4 mg via INTRAVENOUS

## 2022-01-30 MED ORDER — INDOMETHACIN 50 MG RE SUPP
RECTAL | Status: DC | PRN
  Administered 2022-01-30: 100 mg via RECTAL

## 2022-01-30 MED ORDER — FENTANYL CITRATE (PF) 100 MCG/2ML IJ SOLN
INTRAMUSCULAR | Status: AC
  Filled 2022-01-30: qty 2

## 2022-01-30 MED ORDER — INDOMETHACIN 50 MG RE SUPP
RECTAL | Status: AC
  Filled 2022-01-30: qty 2

## 2022-01-30 MED ORDER — SODIUM CHLORIDE 0.9 % IV SOLN: INTRAVENOUS | Status: DC

## 2022-01-30 MED ORDER — LACTATED RINGERS IV SOLN: INTRAVENOUS | Status: DC

## 2022-01-30 MED ORDER — METHOCARBAMOL 500 MG PO TABS: 500.0000 mg | ORAL_TABLET | Freq: Three times a day (TID) | ORAL | Status: DC | PRN

## 2022-01-30 MED ORDER — LIDOCAINE 2% (20 MG/ML) 5 ML SYRINGE
INTRAMUSCULAR | Status: DC | PRN
  Administered 2022-01-30: 60 mg via INTRAVENOUS

## 2022-01-30 MED ORDER — GLUCAGON HCL RDNA (DIAGNOSTIC) 1 MG IJ SOLR
INTRAMUSCULAR | Status: DC | PRN
  Administered 2022-01-30 (×2): .25 mg via INTRAVENOUS

## 2022-01-30 MED ORDER — HYDRALAZINE HCL 20 MG/ML IJ SOLN: 10.0000 mg | Freq: Four times a day (QID) | INTRAMUSCULAR | Status: DC | PRN

## 2022-01-30 NOTE — Telephone Encounter (Signed)
-----   Message from Lemar Lofty., MD sent at 01/30/2022 12:46 PM EST ----- Regarding: Follow-up [name] [name], This patient needs a MRI/MRCP in 4 weeks. She needs liver test to be drawn at that time as well. Follow-up in clinic with myself or one of our APP's. Repeat ERCP in 6 to 10 weeks. Thanks. GM

## 2022-01-30 NOTE — Consult Note (Signed)
Consultation  Referring Provider:     Courage Emokpae / Lala Lund Primary Care Physician:  Redmond School, MD Primary Gastroenterologist:        Althia Forts Reason for Consultation:     choledocholithiasis         HPI:   [name] [name] is a 71 y.o. female with a history of cholecystectomy in the 1980s, obstructive sleep apnea on nocturnal oxygen, hypertension, who presented to [facility] last night with right upper quadrant pain and fever, and jaundice.  She reports a few days ago she developed some right upper quadrant pain initially dull, rated 8 out of 10, was intermittent.  She states she has not really ever had that before.  Her discomfort persisted and then she developed jaundice yesterday as well as a low-grade fever and wanted to go to the hospital for evaluation.  She has never had jaundice before either.  In the emergency department she was febrile to 101.6.  She had leukocytosis of 11.6 with a left shift, new elevation in liver enzymes.  Bilirubin 14.6, alk phos 670, AST 62, ALT 52.  She was slightly tachycardic with heart rate around 100, blood pressure stable and normal oxygen sats.  She had a CT scan chest abdomen pelvis done in the emergency room.  She had a markedly dilated extrahepatic bile duct to 2.2 cm with 3 radiopaque stones in the distal common bile duct measuring up to 1.3 cm.  There was associated moderate to severe intrahepatic bile duct dilation as well along with a cystic structure adjacent to the gallbladder fossa that contained suspected gallstones as well, suspected to be possible gallbladder remnant.  No evidence of cirrhosis otherwise.  Spleen is normal.  She denies any history of gastric surgeries or gastric bypass.  She does admit to significant weight loss over the past year.  She states her husband died about 1 year ago, she has had a very difficult time grieving his loss and generally has not been eating much and attributes her weight loss due  to her decreased p.o. intake related to her grief.  Her abdominal pain is improved and she is actually feeling much better since coming to the hospital and receiving antibiotics for which she was started on in the ED.  She denies any significant cardiopulmonary comorbidities.  Denies any shortness of breath or chest pain.  She has a history of a remote ectopic pregnancy with surgery for that but otherwise denies any abdominal surgeries outside of her cholecystectomy.  She does not take any blood thinners.  She has not had anything to eat or drink today, she last had any p.o. intake last night, which was some water yesterday evening.  Preliminary blood cultures positive with gram-negative rods.  Upon arrival to our hospital her blood pressure was 138/92, heart rate 88.  Oxygen sats 100% on room air.  She denies ever having a prior colonoscopy or EGD.  Past Medical History:  Diagnosis Date   Anxiety    Asthma    Ectopic pregnancy    Fibromyalgia    Glaucoma    Hypertension    OSA (obstructive sleep apnea)    o2 at 2.5 liters at night   Rheumatoid arthritis(714.0)     Past Surgical History:  Procedure Laterality Date   BACK SURGERY     CATARACT EXTRACTION     CATARACT EXTRACTION W/PHACO  01/16/2012   Procedure: CATARACT EXTRACTION PHACO AND INTRAOCULAR LENS PLACEMENT ([place]);  Surgeon: Kayleen Memos  F Haines, MD;  Location: [facility];  Service: Ophthalmology;  Laterality: Right;  CDE: 14.05   CERVICAL LAMINECTOMY     with plating   CHOLECYSTECTOMY     ECTOPIC PREGNANCY SURGERY     KNEE ARTHROSCOPY     TUBAL LIGATION     vericose vein surgery      Family History  Problem Relation Age of Onset   Stroke Maternal Grandmother    Parkinson's disease Father    Arthritis Mother    Osteoporosis Mother    Fibromyalgia Mother    Parkinson's disease Brother      Social History   Tobacco Use   Smoking status: Never   Smokeless tobacco: Never  Vaping Use   Vaping Use: Never used   Substance Use Topics   Alcohol use: No   Drug use: No    Prior to Admission medications   Medication Sig Start Date End Date Taking? Authorizing Provider  butalbital-acetaminophen-caffeine (FIORICET) 50-325-40 MG tablet Take 1 tablet by mouth 4 (four) times daily as needed. 07/09/21   [provider]  diltiazem (CARDIZEM) 120 MG tablet Take 360 mg by mouth daily. 06/23/21   [provider]  fexofenadine (ALLEGRA) 180 MG tablet Take 180 mg by mouth daily.    [provider]  fluticasone (FLONASE) 50 MCG/ACT nasal spray Place 2 sprays into both nostrils daily. 03/24/21   [provider]  FOLIC ACID PO Take by mouth.    [provider]  furosemide (LASIX) 20 MG tablet Take 20 mg by mouth daily as needed. Patient not taking: Reported on 04/23/2021 02/19/21   [provider]  hydrOXYzine (ATARAX) 25 MG tablet Take 25-50 mg by mouth at bedtime. 04/04/21   [provider]  methocarbamol (ROBAXIN) 500 MG tablet Take by mouth. 08/29/17   [provider]  OMEPRAZOLE PO Take by mouth.    [provider]  OXYGEN Inhale 2 L into the lungs at bedtime.    [provider]  Probiotic Product (PROBIOTIC PO) Take by mouth.    [provider]    Current Facility-Administered Medications  Medication Dose Route Frequency Provider Last Rate Last Admin   cefTRIAXone (ROCEPHIN) 2 g in sodium chloride 0.9 % 100 mL IVPB  2 g Intravenous Q24H Adefeso, Oladapo, DO       feeding supplement (ENSURE ENLIVE / ENSURE PLUS) liquid 237 mL  237 mL Oral BID BM Adefeso, Oladapo, DO       lactated ringers infusion   Intravenous Continuous Adefeso, Oladapo, DO 150 mL/hr at 01/30/22 0746 1,000 mL at 01/30/22 0746   lactulose (CHRONULAC) 10 GM/15ML solution 20 g  20 g Oral TID Adefeso, Oladapo, DO   20 g at 01/29/22 2331   metroNIDAZOLE (FLAGYL) IVPB 500 mg  500 mg Intravenous Q12H Adefeso, Oladapo, DO       pantoprazole (PROTONIX) EC  tablet 40 mg  40 mg Oral Daily Adefeso, Oladapo, DO        Allergies as of 01/29/2022 - Review Complete 01/29/2022  Allergen Reaction Noted   Dolobid [diflunisal]  04/23/2021   Biaxin [clarithromycin] Nausea And Vomiting 04/23/2021   Lyrica [pregabalin] Swelling 04/23/2021   Other Nausea And Vomiting 04/23/2021   Oxycontin [oxycodone] Nausea And Vomiting 04/23/2021   Singulair [montelukast] Nausea And Vomiting 04/23/2021     Review of Systems:    As per HPI, otherwise negative    Physical Exam:  Vital signs in last 24 hours: Temp:  [98.2 F (36.8   C)-101.6 F (38.7 C)] 98.4 F (36.9 C) (12/17 0904) Pulse Rate:  [39-103] 88 (12/17 0904) Resp:  [13-29] 18 (12/17 0904) BP: (109-153)/(71-92) 138/92 (12/17 0904) SpO2:  [92 %-100 %] 100 % (12/17 0904) FiO2 (%):  [21 %] 21 % (12/16 2309) Weight:  [76.2 kg] 76.2 kg (12/16 1712) Last BM Date : 01/26/22 General:   Pleasant female in NAD Head:  Normocephalic and atraumatic. Eyes:   (+) scleral icterus.    Ears:  Normal auditory acuity. Neck:  Supple Lungs:  Respirations even and unlabored. Lungs clear to auscultation bilaterally.    Heart:  Regular rate and rhythm; no MRG Abdomen:  Soft, nondistended, mild RUQ TTP.   Rectal:  Not performed.  Msk:  Symmetrical without gross deformities.  Extremities:  mild LE edema. Chronic venous stasis changes LE bilaterally Neurologic:  Alert and  oriented x4;  grossly normal neurologically. Skin:  chronic venous stasis changes LE bilaterally Psych:  Alert and cooperative. Normal affect.  LAB RESULTS: Recent Labs    01/29/22 1746 01/30/22 0347  WBC 11.6* 10.9*  HGB 12.4 11.4*  HCT 35.3* 32.8*  PLT 201 216   BMET Recent Labs    01/29/22 1746 01/30/22 0347  NA 131* 138  K 2.7* 3.5  CL 95* 103  CO2 26 27  GLUCOSE 128* 116*  BUN 14 11  CREATININE <0.30* <0.30*  CALCIUM 8.4* 8.4*   LFT Recent Labs    01/30/22 0347  PROT 6.0*  ALBUMIN 2.4*  AST 44*  ALT 41  ALKPHOS 561*   BILITOT 12.0*   PT/INR Recent Labs    01/29/22 1746  LABPROT 13.0  INR 1.0    STUDIES: CT CHEST ABDOMEN PELVIS W CONTRAST  Result Date: 01/29/2022 CLINICAL DATA:  Elevated LFTs, elevated bilirubin, fever.  Jaundice. EXAM: CT CHEST, ABDOMEN, AND PELVIS WITH CONTRAST TECHNIQUE: Multidetector CT imaging of the chest, abdomen and pelvis was performed following the standard protocol during bolus administration of intravenous contrast. RADIATION DOSE REDUCTION: This exam was performed according to the departmental dose-optimization program which includes automated exposure control, adjustment of the mA and/or kV according to patient size and/or use of iterative reconstruction technique. CONTRAST:  100mL OMNIPAQUE IOHEXOL 300 MG/ML  SOLN COMPARISON:  CT angio chest 02/20/2020 FINDINGS: CT CHEST FINDINGS Cardiovascular: Mitral valve calcifications. Aortic and coronary artery calcifications. Normal heart size. No pericardial effusion. Mediastinum/Nodes: A right cardiophrenic lymph node measures 0.7 cm short axis (series 2, image 35). No other enlarged mediastinal lymph nodes. A 1.1 cm short axis left hilar lymph node is unchanged compared to 02/20/2020 (series 2, image 28). No right hilar lymphadenopathy. No axillary lymphadenopathy. Thyroid gland and trachea demonstrate no significant abnormalities. Small hiatal hernia. Lungs/Pleura: Minimal subsegmental dependent atelectasis. Lungs are otherwise clear. No pleural effusion or pneumothorax. Musculoskeletal: No chest wall mass or suspicious bone lesions identified. Partially visualized anterior cervical spinal fixation. CT ABDOMEN PELVIS FINDINGS Hepatobiliary: No focal liver abnormality is seen. Status post cholecystectomy. The extrahepatic bile duct is markedly dilated, measuring 2.2 cm in diameter (series 2, image 61). There are 3 radiopaque stones in the distal aspect of the extrahepatic bile duct, measuring up to 1.3 cm (series 6, image 62). There is  associated moderate-to-severe intrahepatic bile duct dilatation. Adjacent to the gallbladder fossa just inferior to the extrahepatic bile duct at the level of the porta hepatis, there is a 4.4 x 1.2 cm cystic structure which contains 2 radiodensities which measure up to 0.7 cm (series 6, images 48, 52). Pancreas:   Diffuse moderate parenchymal volume loss. The main pancreatic duct is not dilated. Spleen: Normal in size without focal abnormality. Adrenals/Urinary Tract: Adrenal glands are unremarkable. Kidneys are normal, without renal calculi, suspicious focal lesion, or hydronephrosis. Bladder is unremarkable. Stomach/Bowel: Small hiatal hernia. Appendix appears normal. Diverticulosis of the descending and sigmoid colon. Large fecal burden throughout the colon. No evidence of bowel wall thickening, distention, or inflammatory changes. Vascular/Lymphatic: Minimal aortic atherosclerosis. Enlarged nodes at the porta hepatis measure up to 1.7 cm short axis (series 2, image 56, 61). No other enlarged lymph nodes in the abdomen or pelvis. Reproductive: Uterus and bilateral adnexa are unremarkable. Pessary is in place. Other: Small fat containing umbilical hernia. Small bilateral fat containing inguinal hernias. Musculoskeletal: No acute or suspicious osseous findings. 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 IMPRESSION: CT CHEST: 1. No acute findings in the chest. 2. Indeterminate 0.7 cm right cardiophrenic lymph node. Recommend attention on follow-up imaging. CT ABDOMEN AND PELVIS: 1. Choledocholithiasis with resulting marked extrahepatic and intrahepatic bile duct dilatation. 2. A 4.4 cm fluid density structure containing 2 additional stones is noted at the level of the porta hepatis, possibly representing gallbladder remnant given history of prior cholecystectomy. 3. Enlarged porta hepatis nodes are  favored to be reactive in etiology. 4. Other incidental findings as described in the body of the report. Electronically Signed   By: [name]  Parra M.D.   On: 01/29/2022 20:17   DG Chest Port 1 View  Result Date: 01/29/2022 CLINICAL DATA:  Weakness and lethargy EXAM: PORTABLE CHEST 1 VIEW COMPARISON:  02/19/2020, CT 02/20/2020, 12/31/2016 FINDINGS: Low lung volumes. No consolidation, pleural effusion or pneumothorax. Borderline cardiomegaly. Enlarged upper mediastinal silhouette. IMPRESSION: 1. No acute airspace disease 2. Low lung volumes with cardiomegaly. Enlargement of the upper mediastinal silhouette, likely augmented by marked patient rotation and portable technique, but if concern for acute mediastinal pathology, correlation with chest CT should be considered. Electronically Signed   By: Kim  Fujinaga M.D.   On: 01/29/2022 18:10      Impression / Plan:   70-year-old female admitted with the following:  Cholangitis with bacteremia - gram negative rods Choledocholithiasis Jaundice  Presenting with abdominal pain, fever, jaundice, CT scan showing significant choledocholithiasis with biliary ductal dilation and a suspected gallbladder remnant, postcholecystectomy back in the 1980s.  Blood culture showing gram-negative rods.  She is stable on antibiotics, hemodynamics stable, she is feeling better.  She does endorse significant weight loss over the past year but associates this with significant grief of her husband's death and poor p.o. intake due to her grief.  I discussed the findings with her, she has a very good understanding of what is causing her current symptoms.  We discussed choledocholithiasis and her associated biliary obstruction causing infection.  Recommending an ERCP to relieve her biliary obstruction.  We discussed what this would entail, risks of the procedure to include bleeding, perforation, pancreatitis etc, or inability to clear the stones from her duct.  We discussed risks of  anesthesia as well.  She is currently hemodynamically stable and stable for ERCP if there is room to do that today.  Agree with continuing antibiotics for now.  We will keep her n.p.o. for hopeful ERCP at some point today with Dr. Mansouraty who is covering advanced endoscopy this weekend.  Further recommendations pending ERCP and results of that exam.  We will touch base if there   is any change in the schedule or if ERCP does not happen today in regards to availability in the OR / endoscopy suite, etc.  Of note she was started on lactulose at [facility] yesterday evening, I do not see any indication for this, she does not have cirrhosis or encephalopathy.  Will stop it.  Call with questions in the interim.  PLAN: - NPO for now - continue antibiotics - stop lactulose - continue IVF hydration, monitor vitals - tentative ERCP later this morning / today. Schedule could change pending availability in endoscopy / OR / anesthesia  Call with questions  Jolly Mango, MD [facility]

## 2022-01-30 NOTE — Progress Notes (Signed)
PROGRESS NOTE                                                                                                                                                                                                             Patient Demographics:    [name] [name], is a 71 y.o. female, DOB - 06-12-1950, NFA:[id number]  Outpatient Primary MD for the patient is [name], MD    LOS - 1  Admit date - 01/29/2022    Chief Complaint  Patient presents with   Weakness       Brief Narrative (HPI from H&P)   71 y.o. female with medical history significant of hypertension, fibromyalgia, glaucoma who presents to the emergency department via EMS due to noticing the patient was jaundiced.  Patient went to [facility] ED on 12/11 due to weakness and confusion, she was diagnosed to have UTI and was prescribed with [facility] on discharge from the ED.  She felt slightly better, but on Thursday (12/14), she started to complain of right upper quadrant pain family also started to notice that she was getting jaundiced.  She was brought to [facility], ER where she was diagnosed with CBD stone and transferred to [facility] on 01/30/2022.   Subjective:    Aryahna Spagna today has, No headache, No chest pain, No abdominal pain - No Nausea, No new weakness tingling or numbness, no cough - SOB.     Assessment  & Plan :   Choledocholithiasis with questionable ascending cholangitis, gram-negative bacteremia.  Currently does not appear to be septic at all, appears stable, surprisingly mild to no right upper quadrant abdominal pain, continue empiric antibiotics with IV Rocephin and Flagyl, follow culture sensitivity data, GI on board proceeding for ERCP on 01/30/2022.  Continue IV fluids and supportive care.  Transaminitis and mildly elevated ammonia.  Due to above.  Add lactulose as well.  Hypokalemia and hypomagnesemia.  Replaced, hyponatremia due to  dehydration.  Continue to hydrate with IV fluids.  Essential hypertension.  As needed IV hydralazine.  GERD.  PPI.  Mild metabolic encephalopathy due to #1 above along with mildly elevated ammonia.  Plan as above.       Condition - Extremely Guarded  Family Communication  :  None  Code Status :  Full  Consults  :  GI  PUD Prophylaxis :  PPI   Procedures  :     CT - 1. Choledocholithiasis with resulting marked extrahepatic and intrahepatic bile duct dilatation. 2. A 4.4 cm fluid density structure containing 2 additional stones is noted at the level of the porta hepatis, possibly representing gallbladder remnant given history of prior cholecystectomy. 3. Enlarged porta hepatis nodes are favored to be reactive in etiology.   ERCP -       Disposition Plan  :    Status is: Inpatient  DVT Prophylaxis  :    SCDs Start: 01/29/22 2309     Lab Results  Component Value Date   PLT 216 01/30/2022    Diet :  Diet Order             Diet NPO time specified  Diet effective now                    Inpatient Medications  Scheduled Meds:  feeding supplement  237 mL Oral BID BM   lactulose  20 g Oral TID   pantoprazole  40 mg Oral Daily   Continuous Infusions:  cefTRIAXone (ROCEPHIN)  IV     lactated ringers 150 mL/hr at 01/30/22 0910   metronidazole     PRN Meds:.    Objective:   Vitals:   01/30/22 0730 01/30/22 0800 01/30/22 0805 01/30/22 0904  BP: 120/79 124/86  (!) 138/92  Pulse: 71 72  88  Resp: 13 15  18   Temp:   98.2 F (36.8 C) 98.4 F (36.9 C)  TempSrc:   Oral Oral  SpO2: 94% 95%  100%  Weight:      Height:        Wt Readings from Last 3 Encounters:  01/29/22 76.2 kg  09/14/21 76.2 kg  07/22/21 78.7 kg     Intake/Output Summary (Last 24 hours) at 01/30/2022 0917 Last data filed at 01/30/2022 0749 Gross per 24 hour  Intake 2942.26 ml  Output 1000 ml  Net 1942.26 ml     Physical Exam  Awake, minimally confused at times, No new  F.N deficits, Normal affect [place].AT,PERRAL Supple Neck, No JVD,   Symmetrical Chest wall movement, Good air movement bilaterally, CTAB RRR,No Gallops,Rubs or new Murmurs,  +ve B.Sounds, Abd Soft, minimal right upper quadrant tenderness, No Cyanosis, Clubbing or edema       Data Review:    Recent Labs  Lab 01/29/22 1746 01/30/22 0347  WBC 11.6* 10.9*  HGB 12.4 11.4*  HCT 35.3* 32.8*  PLT 201 216  MCV 84.9 85.9  MCH 29.8 29.8  MCHC 35.1 34.8  RDW 13.6 13.9  LYMPHSABS 0.5*  --   MONOABS 0.6  --   EOSABS 0.0  --   BASOSABS 0.1  --     Recent Labs  Lab 01/29/22 1746 01/29/22 2001 01/30/22 0347  NA 131*  --  138  K 2.7*  --  3.5  CL 95*  --  103  CO2 26  --  27  ANIONGAP 10  --  8  GLUCOSE 128*  --  116*  BUN 14  --  11  CREATININE <0.30*  --  <0.30*  AST 62*  --  44*  ALT 52*  --  41  ALKPHOS 670*  --  561*  BILITOT 14.6*  --  12.0*  ALBUMIN 2.8*  --  2.4*  LATICACIDVEN 1.1 1.1  --   INR 1.0  --   --   AMMONIA 51*  --   --  MG  --  1.3* 2.3  CALCIUM 8.4*  --  8.4*    Radiology Reports CT CHEST ABDOMEN PELVIS W CONTRAST  Result Date: 01/29/2022 CLINICAL DATA:  Elevated LFTs, elevated bilirubin, fever.  Jaundice. EXAM: CT CHEST, ABDOMEN, AND PELVIS WITH CONTRAST TECHNIQUE: Multidetector CT imaging of the chest, abdomen and pelvis was performed following the standard protocol during bolus administration of intravenous contrast. RADIATION DOSE REDUCTION: This exam was performed according to the departmental dose-optimization program which includes automated exposure control, adjustment of the mA and/or kV according to patient size and/or use of iterative reconstruction technique. CONTRAST:  OMNIPAQUE IOHEXOL 300 MG/ML  SOLN COMPARISON:  CT angio chest 02/20/2020 FINDINGS: CT CHEST FINDINGS Cardiovascular: Mitral valve calcifications. Aortic and coronary artery calcifications. Normal heart size. No pericardial effusion. Mediastinum/Nodes: A right cardiophrenic  lymph node measures 0.7 cm short axis (series 2, image 35). No other enlarged mediastinal lymph nodes. A 1.1 cm short axis left hilar lymph node is unchanged compared to 02/20/2020 (series 2, image 28). No right hilar lymphadenopathy. No axillary lymphadenopathy. Thyroid gland and trachea demonstrate no significant abnormalities. Small hiatal hernia. Lungs/Pleura: Minimal subsegmental dependent atelectasis. Lungs are otherwise clear. No pleural effusion or pneumothorax. Musculoskeletal: No chest wall mass or suspicious bone lesions identified. Partially visualized anterior cervical spinal fixation. CT ABDOMEN PELVIS FINDINGS Hepatobiliary: No focal liver abnormality is seen. Status post cholecystectomy. The extrahepatic bile duct is markedly dilated, measuring 2.2 cm in diameter (series 2, image 61). There are 3 radiopaque stones in the distal aspect of the extrahepatic bile duct, measuring up to 1.3 cm (series 6, image 62). There is associated moderate-to-severe intrahepatic bile duct dilatation. Adjacent to the gallbladder fossa just inferior to the extrahepatic bile duct at the level of the porta hepatis, there is a 4.4 x 1.2 cm cystic structure which contains 2 radiodensities which measure up to 0.7 cm (series 6, images 48, 52). Pancreas: Diffuse moderate parenchymal volume loss. The main pancreatic duct is not dilated. Spleen: Normal in size without focal abnormality. Adrenals/Urinary Tract: Adrenal glands are unremarkable. Kidneys are normal, without renal calculi, suspicious focal lesion, or hydronephrosis. Bladder is unremarkable. Stomach/Bowel: Small hiatal hernia. Appendix appears normal. Diverticulosis of the descending and sigmoid colon. Large fecal burden throughout the colon. No evidence of bowel wall thickening, distention, or inflammatory changes. Vascular/Lymphatic: Minimal aortic atherosclerosis. Enlarged nodes at the porta hepatis measure up to 1.7 cm short axis (series 2, image 56, 61). No other  enlarged lymph nodes in the abdomen or pelvis. Reproductive: Uterus and bilateral adnexa are unremarkable. Pessary is in place. Other: Small fat containing umbilical hernia. Small bilateral fat containing inguinal hernias. Musculoskeletal: No acute or suspicious osseous findings. 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 IMPRESSION: CT CHEST: 1. No acute findings in the chest. 2. Indeterminate 0.7 cm right cardiophrenic lymph node. Recommend attention on follow-up imaging. CT ABDOMEN AND PELVIS: 1. Choledocholithiasis with resulting marked extrahepatic and intrahepatic bile duct dilatation. 2. A 4.4 cm fluid density structure containing 2 additional stones is noted at the level of the porta hepatis, possibly representing gallbladder remnant given history of prior cholecystectomy. 3. Enlarged porta hepatis nodes are favored to be reactive in etiology. 4. Other incidental findings as described in the body of the report. Electronically Signed   By: Sherron Ales M.D.   On: 01/29/2022 20:17   DG Chest [facility] 1 View  Result  Date: 01/29/2022 CLINICAL DATA:  Weakness and lethargy EXAM: PORTABLE CHEST 1 VIEW COMPARISON:  02/19/2020, CT 02/20/2020, 12/31/2016 FINDINGS: Low lung volumes. No consolidation, pleural effusion or pneumothorax. Borderline cardiomegaly. Enlarged upper mediastinal silhouette. IMPRESSION: 1. No acute airspace disease 2. Low lung volumes with cardiomegaly. Enlargement of the upper mediastinal silhouette, likely augmented by marked patient rotation and portable technique, but if concern for acute mediastinal pathology, correlation with chest CT should be considered. Electronically Signed   By: Jasmine Pang M.D.   On: 01/29/2022 18:10      Signature  -   Susa Raring M.D on 01/30/2022 at 9:17 AM   -  To page go to [email or web address]

## 2022-01-30 NOTE — Anesthesia Preprocedure Evaluation (Signed)
Anesthesia Evaluation   Patient awake    Reviewed: Allergy & Precautions, NPO status , Patient's Chart, lab work & pertinent test results  History of Anesthesia Complications Negative for: history of anesthetic complications  Airway Mallampati: II  TM Distance: >3 FB Neck ROM: Full    Dental  (+) Teeth Intact, Dental Advisory Given,    Pulmonary shortness of breath and Long-Term Oxygen Therapy, asthma    breath sounds clear to auscultation       Cardiovascular hypertension, Pt. on medications (-) angina (-) Past MI and (-) CHF  Rhythm:Regular     Neuro/Psych  PSYCHIATRIC DISORDERS Anxiety      Neuromuscular disease    GI/Hepatic Choledocholithiasis  Lab Results      Component                Value               Date                      ALT                      41                  01/30/2022                AST                      44 (H)              01/30/2022                ALKPHOS                  561 (H)             01/30/2022                BILITOT                  12.0 (H)            01/30/2022              Endo/Other  negative endocrine ROS    Renal/GU negative Renal ROSLab Results      Component                Value               Date                      CREATININE               <0.30 (L)           01/30/2022                Musculoskeletal  (+) Arthritis , Rheumatoid disorders,  Fibromyalgia -  Abdominal   Peds  Hematology  (+) Blood dyscrasia, anemia Lab Results      Component                Value               Date                      WBC  10.9 (H)            01/30/2022                HGB                      11.4 (L)            01/30/2022                HCT                      32.8 (L)            01/30/2022                MCV                      85.9                01/30/2022                PLT                      216                 01/30/2022              Anesthesia Other  Findings   Reproductive/Obstetrics                             Anesthesia Physical Anesthesia Plan  ASA: 4  Anesthesia Plan: General   Post-op Pain Management: Minimal or no pain anticipated   Induction: Intravenous  PONV Risk Score and Plan: 3 and Ondansetron and Dexamethasone  Airway Management Planned: Oral ETT  Additional Equipment: None  Intra-op Plan:   Post-operative Plan: Extubation in OR  Informed Consent: I have reviewed the patients History and Physical, chart, labs and discussed the procedure including the risks, benefits and alternatives for the proposed anesthesia with the patient or authorized representative who has indicated his/her understanding and acceptance.     Dental advisory given  Plan Discussed with: CRNA  Anesthesia Plan Comments:        Anesthesia Quick Evaluation

## 2022-01-30 NOTE — ED Notes (Signed)
Report given to [facility]  

## 2022-01-30 NOTE — Op Note (Signed)
[facility] Patient Name: [name] [name] Procedure Date : 01/30/2022 MRN: [id number] Attending MD: Justice Britain , MD, [id number] Date of Birth: 02/24/1950 CSN: [id number] Age: 71 Admit Type: Inpatient Procedure:                ERCP Indications:              Bile duct stone(s), Abnormal abdominal CT, Biliary                            dilation on Computed Tomogram Scan, Ascending                            cholangitis, For therapy of ascending cholangitis,                            Jaundice Providers:                Justice Britain, MD, Doristine Johns, RN,                            Luan Moore, Technician Referring MD:             Inpatient medical team,Steven P. Armbruster, MD,                            Amy Esterwood PA, PA Medicines:                General Anesthesia, Indomethacin 100 mg PR,                            Glucagon 0.5 mg IV, Antibiotics that were already                            scheduled were administered so no additional                            antibiotics were given during the procedure Complications:            No immediate complications. Estimated Blood Loss:     Estimated blood loss was minimal. Procedure:                Pre-Anesthesia Assessment:                           - Prior to the procedure, a History and Physical                            was performed, and patient medications and                            allergies were reviewed. The patient's tolerance of                            previous anesthesia was also reviewed. The risks                            and benefits  of the procedure and the sedation                            options and risks were discussed with the patient.                            All questions were answered, and informed consent                            was obtained. Prior Anticoagulants: The patient has                            taken no anticoagulant or antiplatelet agents                             except for NSAID medication. ASA Grade Assessment:                            III - A patient with severe systemic disease. After                            reviewing the risks and benefits, the patient was                            deemed in satisfactory condition to undergo the                            procedure.                           After obtaining informed consent, the scope was                            passed under direct vision. Throughout the                            procedure, the patient's blood pressure, pulse, and                            oxygen saturations were monitored continuously. The                            [id number]-Q190V ([id number]) Olympus duodenoscope was                            introduced through the mouth, and used to inject                            contrast into and used to inject contrast into the                            bile duct. The ERCP was accomplished without  difficulty. The patient tolerated the procedure. Scope In: Scope Out: Findings:      A scout film of the abdomen was obtained. Surgical clips, consistent       with a previous cholecystectomy, were seen in the area of the right       upper quadrant of the abdomen.      The upper GI tract was traversed under direct vision without detailed       examination. A low-grade of narrowing Schatzki ring was found at the       gastroesophageal junction. A 3 cm hiatal hernia was present. Patchy mild       inflammation characterized by erosions and erythema was found in the       entire examined stomach - the stomach was biopsied for HP evaluation. No       gross lesions were noted in the duodenal bulb, in the first portion of       the duodenum and in the second portion of the duodenum. The major       papilla was on the rim of a diverticulum. The major papilla was       otherwise normal in appearance.      A short 0.035 inch Soft Jagwire was passed into  the biliary tree after       going into a very long position. The Hydratome sphincterotome was passed       over the guidewire and the bile duct was then deeply cannulated.       Contrast was injected. I personally interpreted the bile duct images.       Ductal flow of contrast was adequate. Image quality was adequate.       Contrast extended to the hepatic ducts. Opacification of the entire       biliary tree except for the cystic duct and gallbladder was successful.       The middle third of the main bile duct and upper third of the main bile       duct were moderately dilated. The largest diameter was 20 mm. The lower       third of the main bile duct and middle third of the main bile duct       contained filling defect thought to be stones and sludge. The biliary       pancreatic junction and lower third of the main bile duct contained a       single localized narrowing 12 mm in length, with duct discrepancy       between the major bile duct and the distal bile duct. The upper third of       the main bile duct contained an irregularity there was concerning for a       sigmoidized biliary (I could not go much further because the duodenal       wall was present) tree. An 8 mm biliary sphincterotomy was made with a       monofilament Hydratome sphincterotome using ERBE electrocautery. There       was no post-sphincterotomy bleeding. Dilation of the distal common bile       duct as a DASE sphincteroplasty with an 09-22-08 mm x 5.5 cm CRE balloon       (to a maximum balloon size of 10 mm) dilator was successful (3 minutes       held in total). Unfortunately even by doing this the duct discrepancy       was not  alleviated. To discover objects, the biliary tree was swept with       a retrieval balloon. Pus was swept from the duct. Sludge was swept from       the duct. A few stones were removed. A few larger stones remained.       Attempts for lithotripsy with a basket-type device were partially        successful. To discover objects, the biliary tree was swept with a       retrieval balloon. Sludge was swept from the duct. Pus was swept from       the duct. As a result of the significant abnormality/variance in the       biliary tree, I made decision to proceed with allowing biliary       decompression before further attempts at extraction which were already       difficult. Two plastic biliary stents (10 Fr by 5 cm and 10 Fr by 7 cm)       with a single external flap and a single internal flap were placed into       the common bile duct. The stents were in good position.      A pancreatogram was not performed.      The duodenoscope was withdrawn from the patient. Impression:               - Low-grade of narrowing Schatzki ring.                           - 3 cm hiatal hernia.                           - Gastritis. Biopsies for HP were taken.                           - No gross lesions in the duodenal bulb, in the                            first portion of the duodenum and in the second                            portion of the duodenum.                           - The major papilla was on the rim of a                            diverticulum. Otherwise it was normal in appearance.                           - Filling defects consistent with stones and sludge                            were seen on the cholangiogram.                           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                           - The upper third of the main bile duct and middle                            third of the main bile duct were moderately                            dilated. There was a sigmoidized upper biliary tree.                           - A duct discrepancy with distal narrowing was                            found.                           - Choledocholithiasis and cholangitis were found.                             Partial removal was accomplished with biliary                            sphincterotomy and balloon dilation and balloon                            sweep; 2 biliary stents were to allow for adequate                            decompression inserted. Recommendation:           - The patient will be observed post-procedure,                            until all discharge criteria are met.                           - Return patient to hospital ward for ongoing care.                           - Check liver enzymes (AST, ALT, alkaline                            phosphatase, bilirubin) in the morning.                           - Observe patient's clinical course.                           - Await path results.                           - Initiate her on PPI 40 mg daily.                           -  Would recommend repeat imaging with MRI/MRCP in 4                            weeks to get better sense of this potential cystic                            lesion versus if this is the sigmoidized bile duct.                           - Follow-up in clinic after MRI/MRCP.                           - Repeat ERCP with likely EHL in 6 to 10 weeks for                            stent removal, stone removal.                           - Antibiotics as per sensitivities from the GNR                            bacteremia and as per primary service.                           - The findings and recommendations were discussed                            with the patient.                           - The findings and recommendations were discussed                            with the patient's family.                           - The findings and recommendations were discussed                            with the referring physician. Procedure Code(s):        --- Professional ---                           [phone], Endoscopic retrograde                            cholangiopancreatography (ERCP); with placement of                             endoscopic stent into biliary or pancreatic duct,                            including pre- and post-dilation and guide wire  passage, when performed, including sphincterotomy,                            when performed, each stent                           43274, 68, Endoscopic retrograde                            cholangiopancreatography (ERCP); with placement of                            endoscopic stent into biliary or pancreatic duct,                            including pre- and post-dilation and guide wire                            passage, when performed, including sphincterotomy,                            when performed, each stent                           43265, Endoscopic retrograde                            cholangiopancreatography (ERCP); with destruction                            of calculi, any method (eg, mechanical,                            electrohydraulic, lithotripsy) Diagnosis Code(s):        --- Professional ---                           K22.2, Esophageal obstruction                           K44.9, Diaphragmatic hernia without obstruction or                            gangrene                           K29.70, Gastritis, unspecified, without bleeding                           K80.31, Calculus of bile duct with cholangitis,                            unspecified, with obstruction                           R93.2, Abnormal findings on diagnostic imaging of  liver and biliary tract                           K83.8, Other specified diseases of biliary tract                           K83.09, Other cholangitis                           R17, Unspecified jaundice                           R93.5, Abnormal findings on diagnostic imaging of                            other abdominal regions, including retroperitoneum CPT copyright 2022 [employer]. All rights reserved. The codes  documented in this report are preliminary and upon coder review may  be revised to meet current compliance requirements. Justice Britain, MD 01/30/2022 12:46:05 PM Number of Addenda: 0

## 2022-01-30 NOTE — Progress Notes (Signed)
PHARMACY - PHYSICIAN COMMUNICATION CRITICAL VALUE ALERT - BLOOD CULTURE IDENTIFICATION (BCID)  [name] [name] is an 71 y.o. female who presented to [facility] on 01/29/2022   Assessment:  4/4 Kleb PNA in blood  Name of physician (or Provider) Contacted: Thedore Mins  Current antibiotics: Ceftriaxone, Flagyl  Changes to prescribed antibiotics recommended:  Continue ceftriaxone DC flagyl  Results for orders placed or performed during the hospital encounter of 01/29/22  Blood Culture ID Panel (Reflexed) (Collected: 01/29/2022  5:46 PM)  Result Value Ref Range   Enterococcus faecalis NOT DETECTED NOT DETECTED   Enterococcus Faecium NOT DETECTED NOT DETECTED   Listeria monocytogenes NOT DETECTED NOT DETECTED   Staphylococcus species NOT DETECTED NOT DETECTED   Staphylococcus aureus (BCID) NOT DETECTED NOT DETECTED   Staphylococcus epidermidis NOT DETECTED NOT DETECTED   Staphylococcus lugdunensis NOT DETECTED NOT DETECTED   Streptococcus species NOT DETECTED NOT DETECTED   Streptococcus agalactiae NOT DETECTED NOT DETECTED   Streptococcus pneumoniae NOT DETECTED NOT DETECTED   Streptococcus pyogenes NOT DETECTED NOT DETECTED   A.calcoaceticus-baumannii NOT DETECTED NOT DETECTED   Bacteroides fragilis NOT DETECTED NOT DETECTED   Enterobacterales DETECTED (A) NOT DETECTED   Enterobacter cloacae complex NOT DETECTED NOT DETECTED   Escherichia coli NOT DETECTED NOT DETECTED   Klebsiella aerogenes NOT DETECTED NOT DETECTED   Klebsiella oxytoca NOT DETECTED NOT DETECTED   Klebsiella pneumoniae DETECTED (A) NOT DETECTED   Proteus species NOT DETECTED NOT DETECTED   Salmonella species NOT DETECTED NOT DETECTED   Serratia marcescens NOT DETECTED NOT DETECTED   Haemophilus influenzae NOT DETECTED NOT DETECTED   Neisseria meningitidis NOT DETECTED NOT DETECTED   Pseudomonas aeruginosa NOT DETECTED NOT DETECTED   Stenotrophomonas maltophilia NOT DETECTED NOT DETECTED   Candida albicans NOT  DETECTED NOT DETECTED   Candida auris NOT DETECTED NOT DETECTED   Candida glabrata NOT DETECTED NOT DETECTED   Candida krusei NOT DETECTED NOT DETECTED   Candida parapsilosis NOT DETECTED NOT DETECTED   Candida tropicalis NOT DETECTED NOT DETECTED   Cryptococcus neoformans/gattii NOT DETECTED NOT DETECTED   CTX-M ESBL NOT DETECTED NOT DETECTED   Carbapenem resistance IMP NOT DETECTED NOT DETECTED   Carbapenem resistance KPC NOT DETECTED NOT DETECTED   Carbapenem resistance NDM NOT DETECTED NOT DETECTED   Carbapenem resist OXA 48 LIKE NOT DETECTED NOT DETECTED   Carbapenem resistance VIM NOT DETECTED NOT DETECTED   Elmer Sow, PharmD, BCCCP Clinical Pharmacist  Please check AMION for all [facility] numbers  01/30/2022 11:39 AM

## 2022-01-30 NOTE — Anesthesia Procedure Notes (Signed)
Procedure Name: Intubation Date/Time: 01/30/2022 10:44 AM  Performed by: Mariea Clonts, CRNAPre-anesthesia Checklist: Patient identified, Emergency Drugs available, Suction available and Patient being monitored Patient Re-evaluated:Patient Re-evaluated prior to induction Oxygen Delivery Method: Circle System Utilized Preoxygenation: Pre-oxygenation with 100% oxygen Induction Type: IV induction Ventilation: Mask ventilation without difficulty Laryngoscope Size: Mac and 3 Grade View: Grade I Tube type: Oral Tube size: 7.0 mm Number of attempts: 1 Airway Equipment and Method: Stylet and Oral airway Placement Confirmation: ETT inserted through vocal cords under direct vision, positive ETCO2 and breath sounds checked- equal and bilateral Tube secured with: Tape Dental Injury: Teeth and Oropharynx as per pre-operative assessment

## 2022-01-30 NOTE — H&P (View-Only) (Signed)
Consultation  Referring Provider:     Courage Emokpae / Lala Lund Primary Care Physician:  Redmond School, MD Primary Gastroenterologist:        Althia Forts Reason for Consultation:     choledocholithiasis         HPI:   [name] [name] is a 71 y.o. female with a history of cholecystectomy in the 1980s, obstructive sleep apnea on nocturnal oxygen, hypertension, who presented to [facility] last night with right upper quadrant pain and fever, and jaundice.  She reports a few days ago she developed some right upper quadrant pain initially dull, rated 8 out of 10, was intermittent.  She states she has not really ever had that before.  Her discomfort persisted and then she developed jaundice yesterday as well as a low-grade fever and wanted to go to the hospital for evaluation.  She has never had jaundice before either.  In the emergency department she was febrile to 101.6.  She had leukocytosis of 11.6 with a left shift, new elevation in liver enzymes.  Bilirubin 14.6, alk phos 670, AST 62, ALT 52.  She was slightly tachycardic with heart rate around 100, blood pressure stable and normal oxygen sats.  She had a CT scan chest abdomen pelvis done in the emergency room.  She had a markedly dilated extrahepatic bile duct to 2.2 cm with 3 radiopaque stones in the distal common bile duct measuring up to 1.3 cm.  There was associated moderate to severe intrahepatic bile duct dilation as well along with a cystic structure adjacent to the gallbladder fossa that contained suspected gallstones as well, suspected to be possible gallbladder remnant.  No evidence of cirrhosis otherwise.  Spleen is normal.  She denies any history of gastric surgeries or gastric bypass.  She does admit to significant weight loss over the past year.  She states her husband died about 1 year ago, she has had a very difficult time grieving his loss and generally has not been eating much and attributes her weight loss due  to her decreased p.o. intake related to her grief.  Her abdominal pain is improved and she is actually feeling much better since coming to the hospital and receiving antibiotics for which she was started on in the ED.  She denies any significant cardiopulmonary comorbidities.  Denies any shortness of breath or chest pain.  She has a history of a remote ectopic pregnancy with surgery for that but otherwise denies any abdominal surgeries outside of her cholecystectomy.  She does not take any blood thinners.  She has not had anything to eat or drink today, she last had any p.o. intake last night, which was some water yesterday evening.  Preliminary blood cultures positive with gram-negative rods.  Upon arrival to our hospital her blood pressure was 138/92, heart rate 88.  Oxygen sats 100% on room air.  She denies ever having a prior colonoscopy or EGD.  Past Medical History:  Diagnosis Date   Anxiety    Asthma    Ectopic pregnancy    Fibromyalgia    Glaucoma    Hypertension    OSA (obstructive sleep apnea)    o2 at 2.5 liters at night   Rheumatoid arthritis(714.0)     Past Surgical History:  Procedure Laterality Date   BACK SURGERY     CATARACT EXTRACTION     CATARACT EXTRACTION W/PHACO  01/16/2012   Procedure: CATARACT EXTRACTION PHACO AND INTRAOCULAR LENS PLACEMENT ([place]);  Surgeon: Kayleen Memos  F Haines, MD;  Location: [facility];  Service: Ophthalmology;  Laterality: Right;  CDE: 14.05   CERVICAL LAMINECTOMY     with plating   CHOLECYSTECTOMY     ECTOPIC PREGNANCY SURGERY     KNEE ARTHROSCOPY     TUBAL LIGATION     vericose vein surgery      Family History  Problem Relation Age of Onset   Stroke Maternal Grandmother    Parkinson's disease Father    Arthritis Mother    Osteoporosis Mother    Fibromyalgia Mother    Parkinson's disease Brother      Social History   Tobacco Use   Smoking status: Never   Smokeless tobacco: Never  Vaping Use   Vaping Use: Never used   Substance Use Topics   Alcohol use: No   Drug use: No    Prior to Admission medications   Medication Sig Start Date End Date Taking? Authorizing Provider  butalbital-acetaminophen-caffeine (FIORICET) 50-325-40 MG tablet Take 1 tablet by mouth 4 (four) times daily as needed. 07/09/21   [provider]  diltiazem (CARDIZEM) 120 MG tablet Take 360 mg by mouth daily. 06/23/21   [provider]  fexofenadine (ALLEGRA) 180 MG tablet Take 180 mg by mouth daily.    [provider]  fluticasone (FLONASE) 50 MCG/ACT nasal spray Place 2 sprays into both nostrils daily. 03/24/21   [provider]  FOLIC ACID PO Take by mouth.    [provider]  furosemide (LASIX) 20 MG tablet Take 20 mg by mouth daily as needed. Patient not taking: Reported on 04/23/2021 02/19/21   [provider]  hydrOXYzine (ATARAX) 25 MG tablet Take 25-50 mg by mouth at bedtime. 04/04/21   [provider]  methocarbamol (ROBAXIN) 500 MG tablet Take by mouth. 08/29/17   [provider]  OMEPRAZOLE PO Take by mouth.    [provider]  OXYGEN Inhale 2 L into the lungs at bedtime.    [provider]  Probiotic Product (PROBIOTIC PO) Take by mouth.    [provider]    Current Facility-Administered Medications  Medication Dose Route Frequency Provider Last Rate Last Admin   cefTRIAXone (ROCEPHIN) 2 g in sodium chloride 0.9 % 100 mL IVPB  2 g Intravenous Q24H Adefeso, Oladapo, DO       feeding supplement (ENSURE ENLIVE / ENSURE PLUS) liquid 237 mL  237 mL Oral BID BM Adefeso, Oladapo, DO       lactated ringers infusion   Intravenous Continuous Adefeso, Oladapo, DO 150 mL/hr at 01/30/22 0746 1,000 mL at 01/30/22 0746   lactulose (CHRONULAC) 10 GM/15ML solution 20 g  20 g Oral TID Adefeso, Oladapo, DO   20 g at 01/29/22 2331   metroNIDAZOLE (FLAGYL) IVPB 500 mg  500 mg Intravenous Q12H Adefeso, Oladapo, DO       pantoprazole (PROTONIX) EC  tablet 40 mg  40 mg Oral Daily Adefeso, Oladapo, DO        Allergies as of 01/29/2022 - Review Complete 01/29/2022  Allergen Reaction Noted   Dolobid [diflunisal]  04/23/2021   Biaxin [clarithromycin] Nausea And Vomiting 04/23/2021   Lyrica [pregabalin] Swelling 04/23/2021   Other Nausea And Vomiting 04/23/2021   Oxycontin [oxycodone] Nausea And Vomiting 04/23/2021   Singulair [montelukast] Nausea And Vomiting 04/23/2021     Review of Systems:    As per HPI, otherwise negative    Physical Exam:  Vital signs in last 24 hours: Temp:  [98.2 F (36.8   C)-101.6 F (38.7 C)] 98.4 F (36.9 C) (12/17 0904) Pulse Rate:  [39-103] 88 (12/17 0904) Resp:  [13-29] 18 (12/17 0904) BP: (109-153)/(71-92) 138/92 (12/17 0904) SpO2:  [92 %-100 %] 100 % (12/17 0904) FiO2 (%):  [21 %] 21 % (12/16 2309) Weight:  [76.2 kg] 76.2 kg (12/16 1712) Last BM Date : 01/26/22 General:   Pleasant female in NAD Head:  Normocephalic and atraumatic. Eyes:   (+) scleral icterus.    Ears:  Normal auditory acuity. Neck:  Supple Lungs:  Respirations even and unlabored. Lungs clear to auscultation bilaterally.    Heart:  Regular rate and rhythm; no MRG Abdomen:  Soft, nondistended, mild RUQ TTP.   Rectal:  Not performed.  Msk:  Symmetrical without gross deformities.  Extremities:  mild LE edema. Chronic venous stasis changes LE bilaterally Neurologic:  Alert and  oriented x4;  grossly normal neurologically. Skin:  chronic venous stasis changes LE bilaterally Psych:  Alert and cooperative. Normal affect.  LAB RESULTS: Recent Labs    01/29/22 1746 01/30/22 0347  WBC 11.6* 10.9*  HGB 12.4 11.4*  HCT 35.3* 32.8*  PLT 201 216   BMET Recent Labs    01/29/22 1746 01/30/22 0347  NA 131* 138  K 2.7* 3.5  CL 95* 103  CO2 26 27  GLUCOSE 128* 116*  BUN 14 11  CREATININE <0.30* <0.30*  CALCIUM 8.4* 8.4*   LFT Recent Labs    01/30/22 0347  PROT 6.0*  ALBUMIN 2.4*  AST 44*  ALT 41  ALKPHOS 561*   BILITOT 12.0*   PT/INR Recent Labs    01/29/22 1746  LABPROT 13.0  INR 1.0    STUDIES: CT CHEST ABDOMEN PELVIS W CONTRAST  Result Date: 01/29/2022 CLINICAL DATA:  Elevated LFTs, elevated bilirubin, fever.  Jaundice. EXAM: CT CHEST, ABDOMEN, AND PELVIS WITH CONTRAST TECHNIQUE: Multidetector CT imaging of the chest, abdomen and pelvis was performed following the standard protocol during bolus administration of intravenous contrast. RADIATION DOSE REDUCTION: This exam was performed according to the departmental dose-optimization program which includes automated exposure control, adjustment of the mA and/or kV according to patient size and/or use of iterative reconstruction technique. CONTRAST:  100mL OMNIPAQUE IOHEXOL 300 MG/ML  SOLN COMPARISON:  CT angio chest 02/20/2020 FINDINGS: CT CHEST FINDINGS Cardiovascular: Mitral valve calcifications. Aortic and coronary artery calcifications. Normal heart size. No pericardial effusion. Mediastinum/Nodes: A right cardiophrenic lymph node measures 0.7 cm short axis (series 2, image 35). No other enlarged mediastinal lymph nodes. A 1.1 cm short axis left hilar lymph node is unchanged compared to 02/20/2020 (series 2, image 28). No right hilar lymphadenopathy. No axillary lymphadenopathy. Thyroid gland and trachea demonstrate no significant abnormalities. Small hiatal hernia. Lungs/Pleura: Minimal subsegmental dependent atelectasis. Lungs are otherwise clear. No pleural effusion or pneumothorax. Musculoskeletal: No chest wall mass or suspicious bone lesions identified. Partially visualized anterior cervical spinal fixation. CT ABDOMEN PELVIS FINDINGS Hepatobiliary: No focal liver abnormality is seen. Status post cholecystectomy. The extrahepatic bile duct is markedly dilated, measuring 2.2 cm in diameter (series 2, image 61). There are 3 radiopaque stones in the distal aspect of the extrahepatic bile duct, measuring up to 1.3 cm (series 6, image 62). There is  associated moderate-to-severe intrahepatic bile duct dilatation. Adjacent to the gallbladder fossa just inferior to the extrahepatic bile duct at the level of the porta hepatis, there is a 4.4 x 1.2 cm cystic structure which contains 2 radiodensities which measure up to 0.7 cm (series 6, images 48, 52). Pancreas:   Diffuse moderate parenchymal volume loss. The main pancreatic duct is not dilated. Spleen: Normal in size without focal abnormality. Adrenals/Urinary Tract: Adrenal glands are unremarkable. Kidneys are normal, without renal calculi, suspicious focal lesion, or hydronephrosis. Bladder is unremarkable. Stomach/Bowel: Small hiatal hernia. Appendix appears normal. Diverticulosis of the descending and sigmoid colon. Large fecal burden throughout the colon. No evidence of bowel wall thickening, distention, or inflammatory changes. Vascular/Lymphatic: Minimal aortic atherosclerosis. Enlarged nodes at the porta hepatis measure up to 1.7 cm short axis (series 2, image 56, 61). No other enlarged lymph nodes in the abdomen or pelvis. Reproductive: Uterus and bilateral adnexa are unremarkable. Pessary is in place. Other: Small fat containing umbilical hernia. Small bilateral fat containing inguinal hernias. Musculoskeletal: No acute or suspicious osseous findings. 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 IMPRESSION: CT CHEST: 1. No acute findings in the chest. 2. Indeterminate 0.7 cm right cardiophrenic lymph node. Recommend attention on follow-up imaging. CT ABDOMEN AND PELVIS: 1. Choledocholithiasis with resulting marked extrahepatic and intrahepatic bile duct dilatation. 2. A 4.4 cm fluid density structure containing 2 additional stones is noted at the level of the porta hepatis, possibly representing gallbladder remnant given history of prior cholecystectomy. 3. Enlarged porta hepatis nodes are  favored to be reactive in etiology. 4. Other incidental findings as described in the body of the report. Electronically Signed   By: Ileana Roup M.D.   On: 01/29/2022 20:17   DG Chest Port 1 View  Result Date: 01/29/2022 CLINICAL DATA:  Weakness and lethargy EXAM: PORTABLE CHEST 1 VIEW COMPARISON:  02/19/2020, CT 02/20/2020, 12/31/2016 FINDINGS: Low lung volumes. No consolidation, pleural effusion or pneumothorax. Borderline cardiomegaly. Enlarged upper mediastinal silhouette. IMPRESSION: 1. No acute airspace disease 2. Low lung volumes with cardiomegaly. Enlargement of the upper mediastinal silhouette, likely augmented by marked patient rotation and portable technique, but if concern for acute mediastinal pathology, correlation with chest CT should be considered. Electronically Signed   By: Donavan Foil M.D.   On: 01/29/2022 18:10      Impression / Plan:   71 year old female admitted with the following:  Cholangitis with bacteremia - gram negative rods Choledocholithiasis Jaundice  Presenting with abdominal pain, fever, jaundice, CT scan showing significant choledocholithiasis with biliary ductal dilation and a suspected gallbladder remnant, postcholecystectomy back in the 1980s.  Blood culture showing gram-negative rods.  She is stable on antibiotics, hemodynamics stable, she is feeling better.  She does endorse significant weight loss over the past year but associates this with significant grief of her husband's death and poor p.o. intake due to her grief.  I discussed the findings with her, she has a very good understanding of what is causing her current symptoms.  We discussed choledocholithiasis and her associated biliary obstruction causing infection.  Recommending an ERCP to relieve her biliary obstruction.  We discussed what this would entail, risks of the procedure to include bleeding, perforation, pancreatitis etc, or inability to clear the stones from her duct.  We discussed risks of  anesthesia as well.  She is currently hemodynamically stable and stable for ERCP if there is room to do that today.  Agree with continuing antibiotics for now.  We will keep her n.p.o. for hopeful ERCP at some point today with Dr. Rush Landmark who is covering advanced endoscopy this weekend.  Further recommendations pending ERCP and results of that exam.  We will touch base if there  is any change in the schedule or if ERCP does not happen today in regards to availability in the OR / endoscopy suite, etc.  Of note she was started on lactulose at [facility] yesterday evening, I do not see any indication for this, she does not have cirrhosis or encephalopathy.  Will stop it.  Call with questions in the interim.  PLAN: - NPO for now - continue antibiotics - stop lactulose - continue IVF hydration, monitor vitals - tentative ERCP later this morning / today. Schedule could change pending availability in endoscopy / OR / anesthesia  Call with questions  Jolly Mango, MD [facility]

## 2022-01-30 NOTE — Progress Notes (Signed)
I attempted to see pt this am at [facility] ED , however she was already picked up by [facility] on [facility] way to [facility]--  Pt was Neither seen, nor examined by me today due to above  Shon Hale, MD

## 2022-01-30 NOTE — Transfer of Care (Signed)
Immediate Anesthesia Transfer of Care Note  Patient: [name] [name]  Procedure(s) Performed: ENDOSCOPIC RETROGRADE CHOLANGIOPANCREATOGRAPHY (ERCP) SPHINCTEROTOMY REMOVAL OF STONES BILIARY STENT PLACEMENT BILIARY DILATION STONE EXTRACTION WITH BASKET BIOPSY  Patient Location: PACU  Anesthesia Type:General  Level of Consciousness: awake, alert , and oriented  Airway & Oxygen Therapy: Patient Spontanous Breathing and Patient connected to nasal cannula oxygen  Post-op Assessment: Report given to RN, Post -op Vital signs reviewed and stable, and Patient moving all extremities X 4  Post vital signs: Reviewed and stable  Last Vitals:  Vitals Value Taken Time  BP 134/81 01/30/22 1224  Temp 36.9 C 01/30/22 1217  Pulse 95 01/30/22 1224  Resp 0 01/30/22 1224  SpO2 96 % 01/30/22 1224  Vitals shown include unvalidated device data.  Last Pain:  Vitals:   01/30/22 1224  TempSrc:   PainSc: 0-No pain         Complications: No notable events documented.

## 2022-01-30 NOTE — Interval H&P Note (Signed)
History and Physical Interval Note:  01/30/2022 10:00 AM  [name] [name]  has presented today for surgery, with the diagnosis of Choledocholithiasis.  The various methods of treatment have been discussed with the patient and family. After consideration of risks, benefits and other options for treatment, the patient has consented to  Procedure(s): ENDOSCOPIC RETROGRADE CHOLANGIOPANCREATOGRAPHY (ERCP) (N/A) as a surgical intervention.  The patient's history has been reviewed, patient examined, no change in status, stable for surgery.  I have reviewed the patient's chart and labs.  Questions were answered to the patient's satisfaction.    The risks of an ERCP were discussed at length, including but not limited to the risk of perforation, bleeding, abdominal pain, post-ERCP pancreatitis (while usually mild can be severe and even life threatening).    [employer]

## 2022-01-31 ENCOUNTER — Encounter (HOSPITAL_COMMUNITY): Payer: Self-pay | Admitting: Gastroenterology

## 2022-01-31 ENCOUNTER — Other Ambulatory Visit (HOSPITAL_COMMUNITY): Payer: Self-pay

## 2022-01-31 DIAGNOSIS — K8050 Calculus of bile duct without cholangitis or cholecystitis without obstruction: Secondary | ICD-10-CM | POA: Diagnosis not present

## 2022-01-31 LAB — TYPE AND SCREEN
ABO/RH(D): A POS
Antibody Screen: NEGATIVE

## 2022-01-31 LAB — COMPREHENSIVE METABOLIC PANEL
ALT: 32 U/L (ref 0–44)
AST: 29 U/L (ref 15–41)
Albumin: 2 g/dL — ABNORMAL LOW (ref 3.5–5.0)
Alkaline Phosphatase: 388 U/L — ABNORMAL HIGH (ref 38–126)
Anion gap: 6 (ref 5–15)
BUN: 19 mg/dL (ref 8–23)
CO2: 29 mmol/L (ref 22–32)
Calcium: 8.3 mg/dL — ABNORMAL LOW (ref 8.9–10.3)
Chloride: 103 mmol/L (ref 98–111)
Creatinine, Ser: 0.5 mg/dL (ref 0.44–1.00)
GFR, Estimated: >60 mL/min (ref >60)
Glucose, Bld: 112 mg/dL — ABNORMAL HIGH (ref 70–99)
Potassium: 3.7 mmol/L (ref 3.5–5.1)
Sodium: 138 mmol/L (ref 135–145)
Total Bilirubin: 4.3 mg/dL — ABNORMAL HIGH (ref 0.3–1.2)
Total Protein: 5.2 g/dL — ABNORMAL LOW (ref 6.5–8.1)

## 2022-01-31 LAB — CBC WITH DIFFERENTIAL/PLATELET
Abs Immature Granulocytes: 0.67 10*3/uL — ABNORMAL HIGH (ref 0.00–0.07)
Basophils Absolute: 0.1 10*3/uL (ref 0.0–0.1)
Basophils Relative: 0 %
Eosinophils Absolute: 0.1 10*3/uL (ref 0.0–0.5)
Eosinophils Relative: 1 %
HCT: 29.1 % — ABNORMAL LOW (ref 36.0–46.0)
Hemoglobin: 9.8 g/dL — ABNORMAL LOW (ref 12.0–15.0)
Immature Granulocytes: 6 %
Lymphocytes Relative: 12 %
Lymphs Abs: 1.3 10*3/uL (ref 0.7–4.0)
MCH: 29.9 pg (ref 26.0–34.0)
MCHC: 33.7 g/dL (ref 30.0–36.0)
MCV: 88.7 fL (ref 80.0–100.0)
Monocytes Absolute: 1 10*3/uL (ref 0.1–1.0)
Monocytes Relative: 9 %
Neutro Abs: 8.6 10*3/uL — ABNORMAL HIGH (ref 1.7–7.7)
Neutrophils Relative %: 72 %
Platelets: 272 10*3/uL (ref 150–400)
RBC: 3.28 MIL/uL — ABNORMAL LOW (ref 3.87–5.11)
RDW: 14.2 % (ref 11.5–15.5)
WBC: 11.7 10*3/uL — ABNORMAL HIGH (ref 4.0–10.5)
nRBC: 0 % (ref 0.0–0.2)

## 2022-01-31 LAB — BRAIN NATRIURETIC PEPTIDE: B Natriuretic Peptide: 239.7 pg/mL — ABNORMAL HIGH (ref 0.0–100.0)

## 2022-01-31 LAB — MAGNESIUM: Magnesium: 1.7 mg/dL (ref 1.7–2.4)

## 2022-01-31 MED ORDER — CIPROFLOXACIN HCL 500 MG PO TABS
500.0000 mg | ORAL_TABLET | Freq: Two times a day (BID) | ORAL | 0 refills | Status: AC
  Filled 2022-01-31: qty 16, 8d supply, fill #0

## 2022-01-31 MED ORDER — CEFDINIR 300 MG PO CAPS
600.0000 mg | ORAL_CAPSULE | Freq: Two times a day (BID) | ORAL | 0 refills | Status: DC
  Filled 2022-01-31: qty 16, 4d supply, fill #0

## 2022-01-31 MED ORDER — DILTIAZEM HCL ER COATED BEADS 120 MG PO CP24
240.0000 mg | ORAL_CAPSULE | Freq: Every day | ORAL | Status: DC
  Administered 2022-01-31: 240 mg via ORAL
  Filled 2022-01-31: qty 2

## 2022-01-31 NOTE — Evaluation (Signed)
Occupational Therapy Evaluation Patient Details Name: [name] [name] MRN: [id number] DOB: [birth year] Today's Date: 01/31/2022   History of Present Illness Pt is a 71 y/o female admitted 01/29/22 at [facility] with right upper quadrant pain and fever, and jaundice. CT imaging dx with Choledocholithiasis. Now s/p ERCP 01/30/22. PMH includes Anxiety, Asthma, Ectopic pregnancy, Fibromyalgia, Glaucoma, Hypertension, OSA (obstructive sleep apnea), and Rheumatoid arthritis.   Clinical Impression   Pt is typically mod I in home environment, enjoys getting out of the house with daughter. Today she is mod I for mobility and ADL demonstrating UB and LB ADL from seated and standing positions. Pt did demonstrate decreased activity tolerance, verbally educated on energy conservation techniques. PT verbalized understanding. Pt with no questions or concerns and OT answered all questions and completed education. OT to sign off at this time.      Recommendations for follow up therapy are one component of a multi-disciplinary discharge planning process, led by the attending physician.  Recommendations may be updated based on patient status, additional functional criteria and insurance authorization.   Follow Up Recommendations  No OT follow up     Assistance Recommended at Discharge PRN  Patient can return home with the following Assistance with cooking/housework    Functional Status Assessment  Patient has not had a recent decline in their functional status  Equipment Recommendations  None recommended by OT (PT has appropriate DME)    Recommendations for Other Services       Precautions / Restrictions Precautions Precautions: None Restrictions Weight Bearing Restrictions: No      Mobility Bed Mobility Overal bed mobility: Modified Independent                  Transfers Overall transfer level: Modified independent Equipment used: None                       Balance Overall balance assessment: Mild deficits observed, not formally tested                                         ADL either performed or assessed with clinical judgement   ADL Overall ADL's : Modified independent                                       General ADL Comments: able to perform toilet transfers, peri care, sink level grooming, transfers from the recliner and EOB, dressing tasks.     Vision Patient Visual Report: No change from baseline       Perception     Praxis      Pertinent Vitals/Pain Pain Assessment Pain Assessment: No/denies pain     Hand Dominance Right   Extremity/Trunk Assessment Upper Extremity Assessment Upper Extremity Assessment: Overall WFL for tasks assessed   Lower Extremity Assessment Lower Extremity Assessment: Overall WFL for tasks assessed;Generalized weakness   Cervical / Trunk Assessment Cervical / Trunk Assessment: Kyphotic   Communication Communication Communication: No difficulties   Cognition Arousal/Alertness: Awake/alert Behavior During Therapy: WFL for tasks assessed/performed Overall Cognitive Status: Within Functional Limits for tasks assessed  General Comments  VSS throughout session    Exercises     Shoulder Instructions      Home Living Family/patient expects to be discharged to:: Private residence Living Arrangements: Children Available Help at Discharge: Family Type of Home: House Home Access: Level entry     Home Layout: One level     Bathroom Shower/Tub: Chief Strategy Officer: Standard Bathroom Accessibility: Yes How Accessible: Accessible via walker Home Equipment: Rolling Walker (2 wheels);Grab bars - toilet;Grab bars - tub/shower;Shower seat          Prior Functioning/Environment Prior Level of Function : Independent/Modified Independent             Mobility Comments: no DME,  but has available if she needs ADLs Comments: my daughter supervises my showers for safety        OT Problem List: Decreased activity tolerance      OT Treatment/Interventions:      OT Goals(Current goals can be found in the care plan section) Acute Rehab OT Goals Patient Stated Goal: get home OT Goal Formulation: With patient Time For Goal Achievement: 02/14/22 Potential to Achieve Goals: Good  OT Frequency:      Co-evaluation              AM-PAC OT "6 Clicks" Daily Activity     Outcome Measure Help from another person eating meals?: None Help from another person taking care of personal grooming?: None Help from another person toileting, which includes using toliet, bedpan, or urinal?: None Help from another person bathing (including washing, rinsing, drying)?: A Little Help from another person to put on and taking off regular upper body clothing?: None Help from another person to put on and taking off regular lower body clothing?: None 6 Click Score: 23   End of Session Equipment Utilized During Treatment: Gait belt Nurse Communication: Mobility status  Activity Tolerance: Patient tolerated treatment well Patient left: in chair;with call bell/phone within reach  OT Visit Diagnosis: Muscle weakness (generalized) (M62.81)                Time: 6010-9323 OT Time Calculation (min): 24 min Charges:  OT General Charges $OT Visit: 1 Visit OT Evaluation $OT Eval Low Complexity: 1 Low OT Treatments $Self Care/Home Management : 8-22 mins  Nyoka Cowden OTR/L Acute Rehabilitation Services Office: [phone]  Evern Bio [facility] 01/31/2022, 10:47 AM

## 2022-01-31 NOTE — Progress Notes (Signed)
Discharge instructions gone over with patient, including new antibiotic. Patient states understanding of when and how to take medication. All belongings packed and sent home with patient. Son will be patient's transport home.

## 2022-01-31 NOTE — TOC Transition Note (Signed)
Transition of Care [facility]) - CM/SW Discharge Note   Patient Details  Name: ANGENI CHAUDHURI MRN: [id number] Date of Birth: February 16, 1950  [facility] CM/SW Contact:  Tom-Johnson, Hershal Coria, RN Phone Number: 01/31/2022, 10:09 AM   Clinical Narrative:     Patient is scheduled for discharge home today. Will discharge home on Oral abx to complete total of 10 days. Hospital f/u info on AVS. No TOC needs or recommendations noted. Family to transport at discharge. No further TOC needs noted.        Final next level of care: Home/Self Care Barriers to Discharge: Barriers Resolved   Patient Goals and CMS Choice Patient states their goals for this hospitalization and ongoing recovery are:: To return home CMS Medicare.gov Compare Post Acute Care list provided to:: Patient Choice offered to / list presented to : NA  Discharge Placement                Patient to be transferred to facility by: Family      Discharge Plan and Services                DME Arranged: N/A DME Agency: NA       HH Arranged: NA HH Agency: NA        Social Determinants of Health (SDOH) Interventions Transportation Interventions: Intervention Not Indicated, Inpatient TOC, Patient Resources (Friends/Family)   Readmission Risk Interventions     No data to display

## 2022-01-31 NOTE — Telephone Encounter (Signed)
Left message on machine to call back  

## 2022-01-31 NOTE — Plan of Care (Signed)

## 2022-01-31 NOTE — Discharge Summary (Signed)
[name] [name]:[id number] DOB: [birth year] DOA: 01/29/2022  PCP: [name], MD  Admit date: 01/29/2022  Discharge date: 01/31/2022  Admitted From: Home   Disposition:  Home   Recommendations for Outpatient Follow-up:   Follow up with PCP in 1-2 weeks  PCP Please obtain BMP/CBC, 2 view CXR in 1week,  (see Discharge instructions)   PCP Please follow up on the following pending results: Follow final blood culture sensitivity data.  BC, CMP and magnesium level in 7 to 10 days.   Home Health: None   Equipment/Devices: None  Consultations: GI Discharge Condition: Stable    CODE STATUS: Full    Diet Recommendation: Heart Healthy     Chief Complaint  Patient presents with   Weakness     Brief history of present illness from the day of admission and additional interim summary    71 y.o. female with medical history significant of hypertension, fibromyalgia, glaucoma who presents to the emergency department via EMS due to noticing the patient was jaundiced.  Patient went to [facility] ED on 12/11 due to weakness and confusion, she was diagnosed to have UTI and was prescribed with [facility] on discharge from the ED.  She felt slightly better, but on Thursday (12/14), she started to complain of right upper quadrant pain family also started to notice that she was getting jaundiced.  She was brought to [facility], ER where she was diagnosed with CBD stone and transferred to [facility] on 01/30/2022.                                                                   Hospital Course   Choledocholithiasis with questionable ascending cholangitis, gram-negative bacteremia.  Was placed on IV Rocephin with Flagyl, sepsis pathophysiology had resolved, she had been seen by GI underwent ERCP with removal of CBD stone and  stent placement, she is completely symptom-free now afebrile and eager to go home, case discussed with ID Dr. Earlene Plater over the phone and GI team.  Patient will be placed on oral Cipro to complete total of 10 days of antibiotics, final sensitivity data is pending although empirically she is growing Klebsiella in her blood cultures.  Will request PCP to monitor final culture sensitivity data and repeat CBC and CMP in 7 to 10 days.   Transaminitis and mildly elevated ammonia.  Due to above.  Likely much better patient's PCP to monitor.   Hypokalemia and hypomagnesemia.  Electrolytes replaced and adequately hydrated with IV fluids.   Essential hypertension.  Will resume home medications   GERD.  PPI.   Mild metabolic encephalopathy due to #1 above along with mildly elevated ammonia.  Resolved.  Discharge diagnosis     Principal Problem:   Choledocholithiasis Active Problems:   Hypokalemia   Hyperammonemia (HCC)  Hyponatremia   Hypomagnesemia   Hypoalbuminemia due to protein-calorie malnutrition (HCC)   Transaminitis   Elevated lipase   Obesity (BMI 30-39.9)   Cholangitis   Jaundice    Discharge instructions    Discharge Instructions     Discharge instructions   Complete by: As directed    Follow with Primary MD [name], MD in 7 days   Get CBC, CMP, Magnesium  -  checked next visit with your primary MD   Activity: As tolerated with Full fall precautions use walker/cane & assistance as needed  Disposition Home   Diet: Heart Healthy   Special Instructions: If you have smoked or chewed Tobacco  in the last 2 yrs please stop smoking, stop any regular Alcohol  and or any Recreational drug use.  On your next visit with your primary care physician please Get Medicines reviewed and adjusted.  Please request your Prim.MD to go over all Hospital Tests and Procedure/Radiological results at the follow up, please get all Hospital records sent to your Prim MD by signing  hospital release before you go home.  If you experience worsening of your admission symptoms, develop shortness of breath, life threatening emergency, suicidal or homicidal thoughts you must seek medical attention immediately by calling 911 or calling your MD immediately  if symptoms less severe.  You Must read complete instructions/literature along with all the possible adverse reactions/side effects for all the Medicines you take and that have been prescribed to you. Take any new Medicines after you have completely understood and accpet all the possible adverse reactions/side effects.   Increase activity slowly   Complete by: As directed        Discharge Medications   Allergies as of 01/31/2022       Reactions   Dolobid [diflunisal]    Biaxin [clarithromycin] Nausea And Vomiting   Lyrica [pregabalin] Swelling   Other Nausea And Vomiting   Vicoprofen   Oxycontin [oxycodone] Nausea And Vomiting   Singulair [montelukast] Nausea And Vomiting        Medication List     STOP taking these medications    furosemide 20 MG tablet Commonly known as: LASIX       TAKE these medications    butalbital-acetaminophen-caffeine 50-325-40 MG tablet Commonly known as: FIORICET Take 1 tablet by mouth 4 (four) times daily as needed.   ciprofloxacin 500 MG tablet Commonly known as: Cipro Take 1 tablet (500 mg total) by mouth 2 (two) times daily for 10 days.   diltiazem 120 MG tablet Commonly known as: CARDIZEM Take 360 mg by mouth daily.   fexofenadine 180 MG tablet Commonly known as: ALLEGRA Take 180 mg by mouth daily.   fluticasone 50 MCG/ACT nasal spray Commonly known as: FLONASE Place 2 sprays into both nostrils daily.   FOLIC ACID PO Take by mouth.   hydrOXYzine 25 MG tablet Commonly known as: ATARAX Take 25-50 mg by mouth at bedtime.   methocarbamol 500 MG tablet Commonly known as: ROBAXIN Take by mouth.   OMEPRAZOLE PO Take by mouth.   OXYGEN Inhale 2 L  into the lungs at bedtime.   PROBIOTIC PO Take by mouth.         Follow-up Information     [name], MD. Schedule an appointment as soon as possible for a visit in 1 week(s).   Specialty: Internal Medicine Contact information: [address] [place] [address] [phone]         Shellia Cleverly, DO. Schedule an  appointment as soon as possible for a visit in 2 week(s).   Specialty: Gastroenterology Contact information: [address] [place] [address] [phone]                 Major procedures and Radiology Reports - PLEASE review detailed and final reports thoroughly  -       DG ERCP  Result Date: 01/31/2022 CLINICAL DATA:  ERCP performed for choledocholithiasis. EXAM: ERCP TECHNIQUE: Multiple spot images obtained with the fluoroscopic device and submitted for interpretation post-procedure. FLUOROSCOPY TIME: FLUOROSCOPY TIME 7 minutes, 27 seconds (42.9 mGy) COMPARISON:  CT the chest, abdomen and pelvis-01/29/2022 FINDINGS: Twelve spot intraoperative fluoroscopic images of the right upper abdominal quadrant during ERCP are provided for review Initial image demonstrates an ERCP probe overlying the right upper abdominal quadrant. Cholecystectomy clips overlies expected location of the gallbladder fossa. Subsequent images demonstrate selective cannulation and opacification of the common bile duct which appears markedly dilated. Several ill-defined filling defects are seen both within the common bile duct as well as the residual cystic duct compatible with known choledocholithiasis (representative images 5 and 7). Subsequent images demonstrate angioplasty at the level of the distal aspect of the CBD/ampulla with subsequent placement of parallel oriented CBD stents. IMPRESSION: 1. ERCP with findings of choledocholithiasis with subsequent biliary plasty and CBD stent placement as above. 2. Choledocholithiasis also seen within the cystic duct remnant.  These images were submitted for radiologic interpretation only. Please see the procedural report for the amount of contrast and the fluoroscopy time utilized. Electronically Signed   By: Simonne Come M.D.   On: 01/31/2022 08:15   CT CHEST ABDOMEN PELVIS W CONTRAST  Result Date: 01/29/2022 CLINICAL DATA:  Elevated LFTs, elevated bilirubin, fever.  Jaundice. EXAM: CT CHEST, ABDOMEN, AND PELVIS WITH CONTRAST TECHNIQUE: Multidetector CT imaging of the chest, abdomen and pelvis was performed following the standard protocol during bolus administration of intravenous contrast. RADIATION DOSE REDUCTION: This exam was performed according to the departmental dose-optimization program which includes automated exposure control, adjustment of the mA and/or kV according to patient size and/or use of iterative reconstruction technique. CONTRAST:  OMNIPAQUE IOHEXOL 300 MG/ML  SOLN COMPARISON:  CT angio chest 02/20/2020 FINDINGS: CT CHEST FINDINGS Cardiovascular: Mitral valve calcifications. Aortic and coronary artery calcifications. Normal heart size. No pericardial effusion. Mediastinum/Nodes: A right cardiophrenic lymph node measures 0.7 cm short axis (series 2, image 35). No other enlarged mediastinal lymph nodes. A 1.1 cm short axis left hilar lymph node is unchanged compared to 02/20/2020 (series 2, image 28). No right hilar lymphadenopathy. No axillary lymphadenopathy. Thyroid gland and trachea demonstrate no significant abnormalities. Small hiatal hernia. Lungs/Pleura: Minimal subsegmental dependent atelectasis. Lungs are otherwise clear. No pleural effusion or pneumothorax. Musculoskeletal: No chest wall mass or suspicious bone lesions identified. Partially visualized anterior cervical spinal fixation. CT ABDOMEN PELVIS FINDINGS Hepatobiliary: No focal liver abnormality is seen. Status post cholecystectomy. The extrahepatic bile duct is markedly dilated, measuring 2.2 cm in diameter (series 2, image 61). There are  3 radiopaque stones in the distal aspect of the extrahepatic bile duct, measuring up to 1.3 cm (series 6, image 62). There is associated moderate-to-severe intrahepatic bile duct dilatation. Adjacent to the gallbladder fossa just inferior to the extrahepatic bile duct at the level of the porta hepatis, there is a 4.4 x 1.2 cm cystic structure which contains 2 radiodensities which measure up to 0.7 cm (series 6, images 48, 52). Pancreas: Diffuse moderate parenchymal volume loss. The main pancreatic duct  is not dilated. Spleen: Normal in size without focal abnormality. Adrenals/Urinary Tract: Adrenal glands are unremarkable. Kidneys are normal, without renal calculi, suspicious focal lesion, or hydronephrosis. Bladder is unremarkable. Stomach/Bowel: Small hiatal hernia. Appendix appears normal. Diverticulosis of the descending and sigmoid colon. Large fecal burden throughout the colon. No evidence of bowel wall thickening, distention, or inflammatory changes. Vascular/Lymphatic: Minimal aortic atherosclerosis. Enlarged nodes at the porta hepatis measure up to 1.7 cm short axis (series 2, image 56, 61). No other enlarged lymph nodes in the abdomen or pelvis. Reproductive: Uterus and bilateral adnexa are unremarkable. Pessary is in place. Other: Small fat containing umbilical hernia. Small bilateral fat containing inguinal hernias. Musculoskeletal: No acute or suspicious osseous findings. 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 IMPRESSION: CT CHEST: 1. No acute findings in the chest. 2. Indeterminate 0.7 cm right cardiophrenic lymph node. Recommend attention on follow-up imaging. CT ABDOMEN AND PELVIS: 1. Choledocholithiasis with resulting marked extrahepatic and intrahepatic bile duct dilatation. 2. A 4.4 cm fluid density structure containing 2 additional stones is noted at the level of the porta  hepatis, possibly representing gallbladder remnant given history of prior cholecystectomy. 3. Enlarged porta hepatis nodes are favored to be reactive in etiology. 4. Other incidental findings as described in the body of the report. Electronically Signed   By: Sherron Ales M.D.   On: 01/29/2022 20:17   DG Chest Port 1 View  Result Date: 01/29/2022 CLINICAL DATA:  Weakness and lethargy EXAM: PORTABLE CHEST 1 VIEW COMPARISON:  02/19/2020, CT 02/20/2020, 12/31/2016 FINDINGS: Low lung volumes. No consolidation, pleural effusion or pneumothorax. Borderline cardiomegaly. Enlarged upper mediastinal silhouette. IMPRESSION: 1. No acute airspace disease 2. Low lung volumes with cardiomegaly. Enlargement of the upper mediastinal silhouette, likely augmented by marked patient rotation and portable technique, but if concern for acute mediastinal pathology, correlation with chest CT should be considered. Electronically Signed   By: Jasmine Pang M.D.   On: 01/29/2022 18:10     Today   Subjective    [name] [name] today has no headache,no chest abdominal pain,no new weakness tingling or numbness, feels much better wants to go home today.    Objective   Blood pressure 112/70, pulse 73, temperature 98.9 F (37.2 C), temperature source Oral, resp. rate 16, height 5\' 1"  (1.549 m), weight 76.2 kg, SpO2 98 %.   Intake/Output Summary (Last 24 hours) at 01/31/2022 0845 Last data filed at 01/31/2022 0819 Gross per 24 hour  Intake 1819.7 ml  Output --  Net 1819.7 ml    Exam  Awake Alert, No new F.N deficits,    [place].AT,PERRAL Supple Neck,   Symmetrical Chest wall movement, Good air movement bilaterally, CTAB RRR,No Gallops,   +ve B.Sounds, Abd Soft, Non tender,  No Cyanosis, Clubbing or edema    Data Review   Recent Labs  Lab 01/29/22 1746 01/30/22 0347 01/31/22 0415  WBC 11.6* 10.9* 11.7*  HGB 12.4 11.4* 9.8*  HCT 35.3* 32.8* 29.1*  PLT 201 216 272  MCV 84.9 85.9 88.7  MCH 29.8 29.8 29.9  MCHC  35.1 34.8 33.7  RDW 13.6 13.9 14.2  LYMPHSABS 0.5*  --  1.3  MONOABS 0.6  --  1.0  EOSABS 0.0  --  0.1  BASOSABS 0.1  --  0.1    Recent Labs  Lab 01/29/22 1746 01/29/22 2001 01/30/22 0347 01/30/22 0940 01/31/22 0415  NA 131*  --  138  --  138  K 2.7*  --  3.5  --  3.7  CL 95*  --  103  --  103  CO2 26  --  27  --  29  ANIONGAP 10  --  8  --  6  GLUCOSE 128*  --  116*  --  112*  BUN 14  --  11  --  19  CREATININE <0.30*  --  <0.30*  --  0.50  AST 62*  --  44*  --  29  ALT 52*  --  41  --  32  ALKPHOS 670*  --  561*  --  388*  BILITOT 14.6*  --  12.0*  --  4.3*  ALBUMIN 2.8*  --  2.4*  --  2.0*  LATICACIDVEN 1.1 1.1  --   --   --   INR 1.0  --   --  1.1  --   AMMONIA 51*  --   --   --   --   BNP  --   --   --   --  239.7*  MG  --  1.3* 2.3  --  1.7  CALCIUM 8.4*  --  8.4*  --  8.3*     Total Time in preparing paper work, data evaluation and todays exam - 35 minutes  Signature  -    Susa Raring M.D on 01/31/2022 at 8:45 AM   -  To page go to [email or web address]

## 2022-01-31 NOTE — Progress Notes (Signed)
Physical Therapy Note  Patient is functioning at a high level of independence and no physical therapy is indicated at this time. Declines need for physical therapy. Uses walker PRN at home, no falls in the past 6 months. Mod I PTA. Walking throughout room independently. PT is signing-off. Please re-order if there is any significant change in status. Thank you for this referral.   Kathlyn Sacramento, PT, DPT Physical Therapist Acute Rehabilitation Services [facility]

## 2022-01-31 NOTE — Discharge Instructions (Signed)
Follow with Primary MD [name], MD in 7 days   Get CBC, CMP, Magnesium  -  checked next visit with your primary MD   Activity: As tolerated with Full fall precautions use walker/cane & assistance as needed  Disposition Home   Diet: Heart Healthy   Special Instructions: If you have smoked or chewed Tobacco  in the last 2 yrs please stop smoking, stop any regular Alcohol  and or any Recreational drug use.  On your next visit with your primary care physician please Get Medicines reviewed and adjusted.  Please request your Prim.MD to go over all Hospital Tests and Procedure/Radiological results at the follow up, please get all Hospital records sent to your Prim MD by signing hospital release before you go home.  If you experience worsening of your admission symptoms, develop shortness of breath, life threatening emergency, suicidal or homicidal thoughts you must seek medical attention immediately by calling 911 or calling your MD immediately  if symptoms less severe.  You Must read complete instructions/literature along with all the possible adverse reactions/side effects for all the Medicines you take and that have been prescribed to you. Take any new Medicines after you have completely understood and accpet all the possible adverse reactions/side effects.

## 2022-01-31 NOTE — Progress Notes (Signed)
[facility] ROUNDING NOTE   Subjective: Underwent ERCP with sphincterotomy, balloon dilatation, stone extraction and CBD stent placement x 2 yesterday.  Feeling well today and she is hopeful for discharge.  Tolerating p.o. intake.  No abdominal pain, nausea, vomiting.   Objective: Vital signs in last 24 hours: Temp:  [98.2 F (36.8 C)-98.9 F (37.2 C)] 98.9 F (37.2 C) (12/18 0554) Pulse Rate:  [68-100] 73 (12/18 0554) Resp:  [11-18] 16 (12/18 0554) BP: (109-138)/(70-92) 112/70 (12/18 0554) SpO2:  [94 %-100 %] 98 % (12/18 0554) Last BM Date : 01/30/22 General: NAD Lungs:  CTA b/l, no w/r/r Heart:  RRR, no m/r/g Abdomen:  Soft, NT, ND, +BS    Intake/Output from previous day: 12/17 0701 - 12/18 0700 In: 1699.7 [P.O.:460; I.V.:1139.7; IV Piggyback:100] Out: 1000 [Urine:1000] Intake/Output this shift: Total I/O In: 120 [P.O.:120] Out: -    Lab Results: Recent Labs    01/29/22 1746 01/30/22 0347 01/31/22 0415  WBC 11.6* 10.9* 11.7*  HGB 12.4 11.4* 9.8*  PLT 201 216 272  MCV 84.9 85.9 88.7   BMET Recent Labs    01/29/22 1746 01/30/22 0347 01/31/22 0415  NA 131* 138 138  K 2.7* 3.5 3.7  CL 95* 103 103  CO2 26 27 29   GLUCOSE 128* 116* 112*  BUN 14 11 19   CREATININE <0.30* <0.30* 0.50  CALCIUM 8.4* 8.4* 8.3*   LFT Recent Labs    01/29/22 1746 01/30/22 0347 01/31/22 0415  PROT 7.0 6.0* 5.2*  ALBUMIN 2.8* 2.4* 2.0*  AST 62* 44* 29  ALT 52* 41 32  ALKPHOS 670* 561* 388*  BILITOT 14.6* 12.0* 4.3*   PT/INR Recent Labs    01/29/22 1746 01/30/22 0940  INR 1.0 1.1      Imaging/Other results: DG ERCP  Result Date: 01/31/2022 CLINICAL DATA:  ERCP performed for choledocholithiasis. EXAM: ERCP TECHNIQUE: Multiple spot images obtained with the fluoroscopic device and submitted for interpretation post-procedure. FLUOROSCOPY TIME: FLUOROSCOPY TIME 7 minutes, 27 seconds (42.9 mGy) COMPARISON:  CT the chest, abdomen and pelvis-01/29/2022  FINDINGS: Twelve spot intraoperative fluoroscopic images of the right upper abdominal quadrant during ERCP are provided for review Initial image demonstrates an ERCP probe overlying the right upper abdominal quadrant. Cholecystectomy clips overlies expected location of the gallbladder fossa. Subsequent images demonstrate selective cannulation and opacification of the common bile duct which appears markedly dilated. Several ill-defined filling defects are seen both within the common bile duct as well as the residual cystic duct compatible with known choledocholithiasis (representative images 5 and 7). Subsequent images demonstrate angioplasty at the level of the distal aspect of the CBD/ampulla with subsequent placement of parallel oriented CBD stents. IMPRESSION: 1. ERCP with findings of choledocholithiasis with subsequent biliary plasty and CBD stent placement as above. 2. Choledocholithiasis also seen within the cystic duct remnant. These images were submitted for radiologic interpretation only. Please see the procedural report for the amount of contrast and the fluoroscopy time utilized. Electronically Signed   By: 02/02/2022 M.D.   On: 01/31/2022 08:15   CT CHEST ABDOMEN PELVIS W CONTRAST  Result Date: 01/29/2022 CLINICAL DATA:  Elevated LFTs, elevated bilirubin, fever.  Jaundice. EXAM: CT CHEST, ABDOMEN, AND PELVIS WITH CONTRAST TECHNIQUE: Multidetector CT imaging of the chest, abdomen and pelvis was performed following the standard protocol during bolus administration of intravenous contrast. RADIATION DOSE REDUCTION: This exam was performed according to the departmental dose-optimization program which includes automated exposure control, adjustment of the mA and/or kV according to patient  size and/or use of iterative reconstruction technique. CONTRAST:  OMNIPAQUE IOHEXOL 300 MG/ML  SOLN COMPARISON:  CT angio chest 02/20/2020 FINDINGS: CT CHEST FINDINGS Cardiovascular: Mitral valve calcifications.  Aortic and coronary artery calcifications. Normal heart size. No pericardial effusion. Mediastinum/Nodes: A right cardiophrenic lymph node measures 0.7 cm short axis (series 2, image 35). No other enlarged mediastinal lymph nodes. A 1.1 cm short axis left hilar lymph node is unchanged compared to 02/20/2020 (series 2, image 28). No right hilar lymphadenopathy. No axillary lymphadenopathy. Thyroid gland and trachea demonstrate no significant abnormalities. Small hiatal hernia. Lungs/Pleura: Minimal subsegmental dependent atelectasis. Lungs are otherwise clear. No pleural effusion or pneumothorax. Musculoskeletal: No chest wall mass or suspicious bone lesions identified. Partially visualized anterior cervical spinal fixation. CT ABDOMEN PELVIS FINDINGS Hepatobiliary: No focal liver abnormality is seen. Status post cholecystectomy. The extrahepatic bile duct is markedly dilated, measuring 2.2 cm in diameter (series 2, image 61). There are 3 radiopaque stones in the distal aspect of the extrahepatic bile duct, measuring up to 1.3 cm (series 6, image 62). There is associated moderate-to-severe intrahepatic bile duct dilatation. Adjacent to the gallbladder fossa just inferior to the extrahepatic bile duct at the level of the porta hepatis, there is a 4.4 x 1.2 cm cystic structure which contains 2 radiodensities which measure up to 0.7 cm (series 6, images 48, 52). Pancreas: Diffuse moderate parenchymal volume loss. The main pancreatic duct is not dilated. Spleen: Normal in size without focal abnormality. Adrenals/Urinary Tract: Adrenal glands are unremarkable. Kidneys are normal, without renal calculi, suspicious focal lesion, or hydronephrosis. Bladder is unremarkable. Stomach/Bowel: Small hiatal hernia. Appendix appears normal. Diverticulosis of the descending and sigmoid colon. Large fecal burden throughout the colon. No evidence of bowel wall thickening, distention, or inflammatory changes. Vascular/Lymphatic: Minimal  aortic atherosclerosis. Enlarged nodes at the porta hepatis measure up to 1.7 cm short axis (series 2, image 56, 61). No other enlarged lymph nodes in the abdomen or pelvis. Reproductive: Uterus and bilateral adnexa are unremarkable. Pessary is in place. Other: Small fat containing umbilical hernia. Small bilateral fat containing inguinal hernias. Musculoskeletal: No acute or suspicious osseous findings. 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 IMPRESSION: CT CHEST: 1. No acute findings in the chest. 2. Indeterminate 0.7 cm right cardiophrenic lymph node. Recommend attention on follow-up imaging. CT ABDOMEN AND PELVIS: 1. Choledocholithiasis with resulting marked extrahepatic and intrahepatic bile duct dilatation. 2. A 4.4 cm fluid density structure containing 2 additional stones is noted at the level of the porta hepatis, possibly representing gallbladder remnant given history of prior cholecystectomy. 3. Enlarged porta hepatis nodes are favored to be reactive in etiology. 4. Other incidental findings as described in the body of the report. Electronically Signed   By: Sherron Ales M.D.   On: 01/29/2022 20:17   DG Chest Port 1 View  Result Date: 01/29/2022 CLINICAL DATA:  Weakness and lethargy EXAM: PORTABLE CHEST 1 VIEW COMPARISON:  02/19/2020, CT 02/20/2020, 12/31/2016 FINDINGS: Low lung volumes. No consolidation, pleural effusion or pneumothorax. Borderline cardiomegaly. Enlarged upper mediastinal silhouette. IMPRESSION: 1. No acute airspace disease 2. Low lung volumes with cardiomegaly. Enlargement of the upper mediastinal silhouette, likely augmented by marked patient rotation and portable technique, but if concern for acute mediastinal pathology, correlation with chest CT should be considered. Electronically Signed   By: Jasmine Pang M.D.   On: 01/29/2022 18:10  Assessment and  Plan:  1) Choledocholithiasis 2) Cholangitis with GNR ERCP on 01/30/2022 with sphincterotomy, balloon dilatation of CBD stricture, stone extraction, and 2 stents placed in the CBD to facilitate drainage.  T. bili significantly improved, now 4.3 (12 yesterday) with normalization of liver enzymes and downtrending ALP (388).  Clinically much improved. - Continue advancing diet as tolerated - Plan for MRI/MRCP in 4 weeks.  Per patient preference, please coordinate to have this done at [facility] imaging - To follow-up in the GI clinic after MRI/MRCP - Plan for repeat ERCP with [facility] in 6-10 weeks with Dr. Meridee Score  - Antimicrobial management per primary service per culture s/s - Ok for DC home today from a GI standpoint    Shellia Cleverly, DO  01/31/2022, 8:41 AM [facility] Pager [phone]

## 2022-02-01 LAB — CULTURE, BLOOD (ROUTINE X 2)
Special Requests: ADEQUATE
Special Requests: ADEQUATE

## 2022-02-01 LAB — SURGICAL PATHOLOGY

## 2022-02-01 NOTE — Anesthesia Postprocedure Evaluation (Signed)
Anesthesia Post Note  Patient: [name]  Procedure(s) Performed: ENDOSCOPIC RETROGRADE CHOLANGIOPANCREATOGRAPHY (ERCP) SPHINCTEROTOMY REMOVAL OF STONES BILIARY STENT PLACEMENT BILIARY DILATION STONE EXTRACTION WITH BASKET BIOPSY     Patient location during evaluation: PACU Anesthesia Type: General Level of consciousness: awake and alert Pain management: pain level controlled Vital Signs Assessment: post-procedure vital signs reviewed and stable Respiratory status: spontaneous breathing, nonlabored ventilation, respiratory function stable and patient connected to nasal cannula oxygen Cardiovascular status: blood pressure returned to baseline and stable Postop Assessment: no apparent nausea or vomiting Anesthetic complications: no  No notable events documented.  Last Vitals:  Vitals:   01/31/22 0554 01/31/22 0901  BP: 112/70 117/82  Pulse: 73 89  Resp: 16 18  Temp: 37.2 C 36.7 C  SpO2: 98% 97%    Last Pain:  Vitals:   01/31/22 0901  TempSrc: Oral  PainSc:                  Gaile Allmon

## 2022-02-01 NOTE — Telephone Encounter (Signed)
Tried again to reach pt and voice mail full will attempt later  

## 2022-02-02 ENCOUNTER — Encounter: Payer: Self-pay | Admitting: Gastroenterology

## 2022-02-02 DIAGNOSIS — G894 Chronic pain syndrome: Secondary | ICD-10-CM | POA: Diagnosis not present

## 2022-02-02 DIAGNOSIS — M47812 Spondylosis without myelopathy or radiculopathy, cervical region: Secondary | ICD-10-CM | POA: Diagnosis not present

## 2022-02-02 DIAGNOSIS — I700 Atherosclerosis of aorta: Secondary | ICD-10-CM | POA: Diagnosis not present

## 2022-02-02 DIAGNOSIS — I872 Venous insufficiency (chronic) (peripheral): Secondary | ICD-10-CM | POA: Diagnosis not present

## 2022-02-02 DIAGNOSIS — K8309 Other cholangitis: Secondary | ICD-10-CM | POA: Diagnosis not present

## 2022-02-02 NOTE — Telephone Encounter (Signed)
I have been unable to reach the pt by phone letter mailed 

## 2022-02-03 ENCOUNTER — Telehealth: Payer: Self-pay

## 2022-02-03 ENCOUNTER — Other Ambulatory Visit: Payer: Self-pay

## 2022-02-03 DIAGNOSIS — K8050 Calculus of bile duct without cholangitis or cholecystitis without obstruction: Secondary | ICD-10-CM

## 2022-02-03 NOTE — Telephone Encounter (Signed)
-----   Message from Orion Modest, RN sent at 02/03/2022  8:18 AM EST ----- Trula Slade, can you help with this?   ----- Message ----- From: Shellia Cleverly, DO Sent: 01/31/2022   8:47 AM EST To: Orion Modest, RN  Anticipate this patient will be discharged at some point today.  Can you please help to coordinate the following: - Plan for MRI/MRCP in 4 weeks. Please place order under Dr. Meridee Score since he will be the one following her as outpatient.  Per patient preference, please coordinate to have this done at [facility] imaging - To follow-up with GM in the GI clinic after MRI/MRCP - Plan for repeat ERCP with [facility] in 6-10 weeks with Dr. Meridee Score   Thanks!

## 2022-02-03 NOTE — Telephone Encounter (Signed)
The pt has been scheduled for ERCP on 04/07/22 at 730 am at [facility] with GM  MRCP at [facility] to be scheduled.  GI will call pt and set up.    Follow up set up for 03/30/22 AT 210 PM with GM  No answer and voice mail not setup will attempt later

## 2022-02-04 NOTE — Telephone Encounter (Signed)
ERCP scheduled, pt instructed and medications reviewed.  Patient instructions mailed to home.  Patient to call with any questions or concerns.   MRCP set up for Jan.  Appt with GM given   All questions answered.

## 2022-02-10 ENCOUNTER — Telehealth: Payer: Self-pay | Admitting: Gastroenterology

## 2022-02-10 NOTE — Telephone Encounter (Signed)
PT returning call. Please advise. 

## 2022-02-11 NOTE — Telephone Encounter (Signed)
No call placed to the pt from this office.  The call may have originated from the hospital in regards to upcoming case.  Message sent to the pt to notify

## 2022-03-03 ENCOUNTER — Encounter: Payer: Self-pay | Admitting: Gastroenterology

## 2022-03-07 ENCOUNTER — Ambulatory Visit
Admission: RE | Admit: 2022-03-07 | Discharge: 2022-03-07 | Disposition: A | Discharge status: Home / Self Care | Payer: Medicare Other | Source: Ambulatory Visit | Attending: Gastroenterology | Admitting: Gastroenterology

## 2022-03-07 DIAGNOSIS — K8050 Calculus of bile duct without cholangitis or cholecystitis without obstruction: Secondary | ICD-10-CM | POA: Diagnosis not present

## 2022-03-07 MED ORDER — GADOPICLENOL 0.5 MMOL/ML IV SOLN
7.0000 mL | Freq: Once | INTRAVENOUS | Status: AC | PRN
  Administered 2022-03-07: 7 mL via INTRAVENOUS

## 2022-03-08 ENCOUNTER — Other Ambulatory Visit: Payer: Self-pay

## 2022-03-08 DIAGNOSIS — K8050 Calculus of bile duct without cholangitis or cholecystitis without obstruction: Secondary | ICD-10-CM

## 2022-03-10 DIAGNOSIS — I10 Essential (primary) hypertension: Secondary | ICD-10-CM | POA: Diagnosis not present

## 2022-03-10 DIAGNOSIS — G894 Chronic pain syndrome: Secondary | ICD-10-CM | POA: Diagnosis not present

## 2022-03-10 DIAGNOSIS — Z6833 Body mass index (BMI) 33.0-33.9, adult: Secondary | ICD-10-CM | POA: Diagnosis not present

## 2022-03-10 DIAGNOSIS — G43909 Migraine, unspecified, not intractable, without status migrainosus: Secondary | ICD-10-CM | POA: Diagnosis not present

## 2022-03-10 DIAGNOSIS — I700 Atherosclerosis of aorta: Secondary | ICD-10-CM | POA: Diagnosis not present

## 2022-03-11 ENCOUNTER — Telehealth: Payer: Self-pay | Admitting: Gastroenterology

## 2022-03-11 NOTE — Telephone Encounter (Signed)
Hepatic function panel has been entered and the pt aware. She says she will have completed when she comes in for her office visit.

## 2022-03-11 NOTE — Telephone Encounter (Signed)
Patient is calling because her PCP needs to know what test we need him to perform before her ERCP. Not sure if its a CBC, CMP or magnesium. Please advise.

## 2022-03-30 ENCOUNTER — Telehealth: Payer: Self-pay | Admitting: Gastroenterology

## 2022-03-30 ENCOUNTER — Encounter: Payer: Self-pay | Admitting: Gastroenterology

## 2022-03-30 ENCOUNTER — Other Ambulatory Visit (INDEPENDENT_AMBULATORY_CARE_PROVIDER_SITE_OTHER): Payer: Medicare Other

## 2022-03-30 ENCOUNTER — Ambulatory Visit (INDEPENDENT_AMBULATORY_CARE_PROVIDER_SITE_OTHER): Payer: Medicare Other | Admitting: Gastroenterology

## 2022-03-30 ENCOUNTER — Encounter (HOSPITAL_COMMUNITY): Payer: Self-pay | Admitting: Gastroenterology

## 2022-03-30 VITALS — BP 136/76 | HR 82 | Ht 61.0 in | Wt 146.0 lb

## 2022-03-30 DIAGNOSIS — K8050 Calculus of bile duct without cholangitis or cholecystitis without obstruction: Secondary | ICD-10-CM | POA: Diagnosis not present

## 2022-03-30 DIAGNOSIS — R7989 Other specified abnormal findings of blood chemistry: Secondary | ICD-10-CM | POA: Diagnosis not present

## 2022-03-30 DIAGNOSIS — Z9889 Other specified postprocedural states: Secondary | ICD-10-CM

## 2022-03-30 LAB — COMPREHENSIVE METABOLIC PANEL
ALT: 9 U/L (ref 0–35)
AST: 15 U/L (ref 0–37)
Albumin: 4.1 g/dL (ref 3.5–5.2)
Alkaline Phosphatase: 118 U/L — ABNORMAL HIGH (ref 39–117)
BUN: 11 mg/dL (ref 6–23)
CO2: 27 mEq/L (ref 19–32)
Calcium: 9.6 mg/dL (ref 8.4–10.5)
Chloride: 103 mEq/L (ref 96–112)
Creatinine, Ser: 0.69 mg/dL (ref 0.40–1.20)
GFR: 86.27 mL/min (ref >60.00)
Glucose, Bld: 103 mg/dL — ABNORMAL HIGH (ref 70–99)
Potassium: 4.3 mEq/L (ref 3.5–5.1)
Sodium: 138 mEq/L (ref 135–145)
Total Bilirubin: 0.5 mg/dL (ref 0.2–1.2)
Total Protein: 7.3 g/dL (ref 6.0–8.3)

## 2022-03-30 LAB — CBC
HCT: 38.5 % (ref 36.0–46.0)
Hemoglobin: 12.9 g/dL (ref 12.0–15.0)
MCHC: 33.6 g/dL (ref 30.0–36.0)
MCV: 89.3 fl (ref 78.0–100.0)
Platelets: 278 10*3/uL (ref 150.0–400.0)
RBC: 4.31 Mil/uL (ref 3.87–5.11)
RDW: 13.6 % (ref 11.5–15.5)
WBC: 6.5 10*3/uL (ref 4.0–10.5)

## 2022-03-30 NOTE — Telephone Encounter (Signed)
Patient called requesting to reschedule her follow up appt but is asking to reschedule, next available is in April. Please assist.

## 2022-03-30 NOTE — H&P (View-Only) (Signed)
[facility] VISIT   Primary Care Provider [employer], [place] [id number] [phone]  Patient Profile: [name] [name] is a 72 y.o. female with a pmh significant for RA, OSA, hypertension, fibromyalgia, status post cholecystectomy, choledocholithiasis (status post ERCP with sphincterotomy and stenting).  The patient presents to the [facility] for an evaluation and management of problem(s) noted below:  Problem List 1. Choledocholithiasis   2. History of biliary stent insertion   3. History of ERCP   4. Abnormal LFTs     History of Present Illness This is a patient that I met in the hospital in the setting of presentation with abdominal pain, jaundice, abnormal LFTs with concerns for cholangitis and choledocholithiasis on imaging.  She underwent a ERCP with me where I was able to access the biliary tree, placed biliary stents after partial removal of stones in an effort of trying to decompress the bile duct and what appeared to be a sigmoidized bile duct.  She was able to eventually be discharged.  She has been doing well but does have some fatigue at times.  She is accompanied by her sister today to the clinic visit.  She has no other significant symptoms at this time.  GI Review of Systems Positive as above Negative for dysphagia, odynophagia, nausea, vomiting, pain, bloating, alteration of bowel habits, melena, hematochezia  Review of Systems General: Denies fevers/chills/weight loss unintentionally Cardiovascular: Denies chest pain Pulmonary: Denies shortness of breath Gastroenterological: See HPI Genitourinary: Denies darkened urine Hematological: Denies easy bruising/bleeding Dermatological: Denies jaundice Psychological: Mood is stable   Medications Current Outpatient Medications  Medication Sig Dispense Refill   butalbital-acetaminophen-caffeine (FIORICET) 50-325-40 MG tablet Take 1 tablet by  mouth 4 (four) times daily as needed.     diltiazem (CARDIZEM) 120 MG tablet Take 360 mg by mouth daily.     fexofenadine (ALLEGRA) 180 MG tablet Take 180 mg by mouth daily.     fluticasone (FLONASE) 50 MCG/ACT nasal spray Place 2 sprays into both nostrils daily.     FOLIC ACID PO Take by mouth.     hydrOXYzine (ATARAX) 25 MG tablet Take 25-50 mg by mouth at bedtime.     methocarbamol (ROBAXIN) 500 MG tablet Take by mouth.     OMEPRAZOLE PO Take by mouth.     OXYGEN Inhale 2 L into the lungs at bedtime.     Probiotic Product (PROBIOTIC PO) Take by mouth.     No current facility-administered medications for this visit.    Allergies Allergies  Allergen Reactions   Dolobid [Diflunisal]    Biaxin [Clarithromycin] Nausea And Vomiting   Lyrica [Pregabalin] Swelling   Other Nausea And Vomiting    Vicoprofen   Oxycontin [Oxycodone] Nausea And Vomiting   Singulair [Montelukast] Nausea And Vomiting    Histories Past Medical History:  Diagnosis Date   Anxiety    Asthma    Ectopic pregnancy    Fibromyalgia    Glaucoma    Hypertension    OSA (obstructive sleep apnea)    o2 at 2.5 liters at night   Rheumatoid arthritis(714.0)    Past Surgical History:  Procedure Laterality Date   BACK SURGERY     BILIARY DILATION  01/30/2022   Procedure: BILIARY DILATION;  Surgeon: Irving Copas., MD;  Location: [place];  Service: Gastroenterology;;   BILIARY STENT PLACEMENT  01/30/2022   Procedure: BILIARY STENT PLACEMENT;  Surgeon: Irving Copas., MD;  Location: [facility];  Service: Gastroenterology;;   BIOPSY  01/30/2022   Procedure: BIOPSY;  Surgeon: Irving Copas., MD;  Location: [place];  Service: Gastroenterology;;   CATARACT EXTRACTION     CATARACT EXTRACTION W/PHACO  01/16/2012   Procedure: CATARACT EXTRACTION PHACO AND INTRAOCULAR LENS PLACEMENT ([place]);  Surgeon: Williams Che, MD;  Location: [facility];  Service: Ophthalmology;  Laterality: Right;   CDE: 14.05   CERVICAL LAMINECTOMY     with plating   CHOLECYSTECTOMY     ECTOPIC PREGNANCY SURGERY     ERCP N/A 01/30/2022   Procedure: ENDOSCOPIC RETROGRADE CHOLANGIOPANCREATOGRAPHY (ERCP);  Surgeon: Irving Copas., MD;  Location: [place];  Service: Gastroenterology;  Laterality: N/A;   KNEE ARTHROSCOPY     REMOVAL OF STONES  01/30/2022   Procedure: REMOVAL OF STONES;  Surgeon: Rush Landmark Telford Nab., MD;  Location: [place];  Service: Gastroenterology;;   Joan Mayans  01/30/2022   Procedure: Joan Mayans;  Surgeon: Irving Copas., MD;  Location: [place];  Service: Gastroenterology;;   STONE EXTRACTION WITH BASKET  01/30/2022   Procedure: STONE EXTRACTION WITH BASKET;  Surgeon: Irving Copas., MD;  Location[facility];  Service: Gastroenterology;;   TUBAL LIGATION     vericose vein surgery     Social History   Socioeconomic History   Marital status: Widowed    Spouse name: Not on file   Number of children: Not on file   Years of education: Not on file   Highest education level: Not on file  Occupational History   Not on file  Tobacco Use   Smoking status: Never   Smokeless tobacco: Never  Vaping Use   Vaping Use: Never used  Substance and Sexual Activity   Alcohol use: No   Drug use: No   Sexual activity: Not Currently    Birth control/protection: Post-menopausal, Surgical, Abstinence    Comment: tubal  Other Topics Concern   Not on file  Social History Narrative   Not on file   Social Determinants of Health   Financial Resource Strain: Low Risk  (04/23/2021)   Overall Financial Resource Strain (CARDIA)    Difficulty of Paying Living Expenses: Not hard at all  Food Insecurity: No Food Insecurity (01/30/2022)   Hunger Vital Sign    Worried About Running Out of Food in the Last Year: Never true    Ran Out of Food in the Last Year: Never true  Transportation Needs: No Transportation Needs (01/30/2022)   PRAPARE -  Hydrologist (Medical): No    Lack of Transportation (Non-Medical): No  Physical Activity: Insufficiently Active (04/23/2021)   Exercise Vital Sign    Days of Exercise per Week: 2 days    Minutes of Exercise per Session: 20 min  Stress: No Stress Concern Present (04/23/2021)   [place]    Feeling of Stress : Not at all  Social Connections: Moderately Integrated (04/23/2021)   Social Connection and Isolation Panel [NHANES]    Frequency of Communication with Friends and Family: Three times a week    Frequency of Social Gatherings with Friends and Family: Twice a week    Attends Religious Services: More than 4 times per year    Active Member of Clubs or Organizations: No    Attends Archivist Meetings: 1 to 4 times per year    Marital Status: Widowed  Intimate Partner Violence: Not At Risk (01/30/2022)   Humiliation, Afraid, Rape, and  Kick questionnaire    Fear of Current or Ex-Partner: No    Emotionally Abused: No    Physically Abused: No    Sexually Abused: No   Family History  Problem Relation Age of Onset   Arthritis Mother    Osteoporosis Mother    Fibromyalgia Mother    Parkinson's disease Father    Parkinson's disease Brother    Stroke Maternal Grandmother    Colon cancer Neg Hx    Esophageal cancer Neg Hx    Inflammatory bowel disease Neg Hx    Liver disease Neg Hx    Pancreatic cancer Neg Hx    Rectal cancer Neg Hx    Stomach cancer Neg Hx    I have reviewed her medical, social, and family history in detail and updated the electronic medical record as necessary.    PHYSICAL EXAMINATION  BP 136/76   Pulse 82   Ht 5' 1"$  (1.549 m)   Wt 146 lb (66.2 kg)   BMI 27.59 kg/m  Wt Readings from Last 3 Encounters:  03/30/22 146 lb (66.2 kg)  01/29/22 167 lb 15.9 oz (76.2 kg)  09/14/21 168 lb (76.2 kg)  GEN: NAD, appears stated age, doesn't appear chronically ill,  accompanied by sister PSYCH: Cooperative, without pressured speech EYE: Conjunctivae pink, sclerae anicteric ENT: MMM CV: Nontachycardic RESP: No audible wheezing GI: NABS, soft, NT/ND, without rebound MSK/EXT: No significant lower extremity edema SKIN: No jaundice NEURO:  Alert & Oriented x 3, no focal deficits   REVIEW OF DATA  I reviewed the following data at the time of this encounter:  GI Procedures and Studies  December 2023 ERCP Impression:               - Low-grade of narrowing Schatzki ring.                           - 3 cm hiatal hernia.                           - Gastritis. Biopsies for HP were taken.                           - No gross lesions in the duodenal bulb, in the                            first portion of the duodenum and in the second                            portion of the duodenum.                           - The major papilla was on the rim of a                            diverticulum. Otherwise it was normal in appearance.                           - Filling defects consistent with stones and sludge  were seen on the cholangiogram.                           - A single localized biliary stricture was found in                            the lower third of the main bile duct.                           - The upper third of the main bile duct and middle                            third of the main bile duct were moderately                            dilated. There was a sigmoidized upper biliary tree.                           - A duct discrepancy with distal narrowing was                            found.                           - Choledocholithiasis and cholangitis were found.                            Partial removal was accomplished with biliary                            sphincterotomy and balloon dilation and balloon                            sweep; 2 biliary stents were to allow for adequate                             decompression inserted.  Laboratory Studies  Reviewed those in epic  Imaging Studies  January 2024 MRI/MRCP IMPRESSION: Prior cholecystectomy. Mild decrease in diffuse biliary ductal dilatation compared to prior CT. Multiple calculi persist throughout the common bile duct, largest measuring 9 mm. Tiny hiatal hernia.   ASSESSMENT  Ms. Kable is a 72 y.o. female with a pmh significant for RA, OSA, hypertension, fibromyalgia, status post cholecystectomy, choledocholithiasis (status post ERCP with sphincterotomy and stenting).  The patient is seen today for evaluation and management of:  1. Choledocholithiasis   2. History of biliary stent insertion   3. History of ERCP   4. Abnormal LFTs    The patient is clinically and hemodynamically stable.  She seems to be doing much better after her ERCP with biliary stenting in the setting of previous obstructive jaundice and cholangitis.  Her recent imaging shows no evidence of pancreatic mass or malignancy.  Will plan to proceed with ERCP in the coming weeks as already scheduled for biliary stent removal and attempt at further biliary stone extraction now that the bile duct is decompressed.  Her sigmoidized bile duct does make  things a little bit more difficult technically but we will see what we are able to do.  The risks of an ERCP were discussed at length, including but not limited to the risk of perforation, bleeding, abdominal pain, post-ERCP pancreatitis (while usually mild can be severe and even life threatening).  The risks and benefits of endoscopic evaluation were discussed with the patient; these include but are not limited to the risk of perforation, infection, bleeding, missed lesions, lack of diagnosis, severe illness requiring hospitalization, as well as anesthesia and sedation related illnesses.  The patient and/or family is agreeable to proceed.  All patient questions were answered to the best of my ability, and the patient agrees to the  aforementioned plan of action with follow-up as indicated.   PLAN  Laboratories as outlined below today Proceed with scheduled ERCP for biliary stent removal and attempt at final choledocholithiasis extraction Pending how patient is doing post procedure and in follow-up, we will consider the role of final colon cancer screening at some point in the coming months   Orders Placed This Encounter  Procedures   CBC   Comp Met (CMET)    New Prescriptions   No medications on file   Modified Medications   No medications on file    Planned Follow Up No follow-ups on file.   Total Time in Face-to-Face and in Coordination of Care for patient including independent/personal interpretation/review of prior testing, medical history, examination, medication adjustment, communicating results with the patient directly, and documentation within the EHR is 25 minutes.   Justice Britain, MD [facility] # [phone]

## 2022-03-30 NOTE — Telephone Encounter (Signed)
I spoke with the pt and she has decided to keep the appt as planned for office visit today.

## 2022-03-30 NOTE — Patient Instructions (Signed)
Your provider has requested that you go to the basement level for lab work before leaving today. Press "B" on the elevator. The lab is located at the first door on the left as you exit the elevator.   _______________________________________________________  If your blood pressure at your visit was 140/90 or greater, please contact your primary care physician to follow up on this.  _______________________________________________________  If you are age 74 or older, your body mass index should be between 23-30. Your Body mass index is 27.59 kg/m. If this is out of the aforementioned range listed, please consider follow up with your Primary Care Provider.  If you are age 80 or younger, your body mass index should be between 19-25. Your Body mass index is 27.59 kg/m. If this is out of the aformentioned range listed, please consider follow up with your Primary Care Provider.   ________________________________________________________  The  GI providers would like to encourage you to use [facility] to communicate with providers for non-urgent requests or questions.  Due to long hold times on the telephone, sending your provider a message by [facility] may be a faster and more efficient way to get a response.  Please allow 48 business hours for a response.  Please remember that this is for non-urgent requests.  _______________________________________________________  Thank you for choosing me and [facility].  Dr. Rush Landmark

## 2022-03-30 NOTE — Progress Notes (Signed)
[place] VISIT   Primary Care Provider [employer], [place] [id number] [phone]  Patient Profile: [name] [name] is a 72 y.o. female with a pmh significant for RA, OSA, hypertension, fibromyalgia, status post cholecystectomy, choledocholithiasis (status post ERCP with sphincterotomy and stenting).  The patient presents to the [facility] for an evaluation and management of problem(s) noted below:  Problem List 1. Choledocholithiasis   2. History of biliary stent insertion   3. History of ERCP   4. Abnormal LFTs     History of Present Illness This is a patient that I met in the hospital in the setting of presentation with abdominal pain, jaundice, abnormal LFTs with concerns for cholangitis and choledocholithiasis on imaging.  She underwent a ERCP with me where I was able to access the biliary tree, placed biliary stents after partial removal of stones in an effort of trying to decompress the bile duct and what appeared to be a sigmoidized bile duct.  She was able to eventually be discharged.  She has been doing well but does have some fatigue at times.  She is accompanied by her sister today to the clinic visit.  She has no other significant symptoms at this time.  GI Review of Systems Positive as above Negative for dysphagia, odynophagia, nausea, vomiting, pain, bloating, alteration of bowel habits, melena, hematochezia  Review of Systems General: Denies fevers/chills/weight loss unintentionally Cardiovascular: Denies chest pain Pulmonary: Denies shortness of breath Gastroenterological: See HPI Genitourinary: Denies darkened urine Hematological: Denies easy bruising/bleeding Dermatological: Denies jaundice Psychological: Mood is stable   Medications Current Outpatient Medications  Medication Sig Dispense Refill   butalbital-acetaminophen-caffeine (FIORICET) 50-325-40 MG tablet Take 1 tablet by  mouth 4 (four) times daily as needed.     diltiazem (CARDIZEM) 120 MG tablet Take 360 mg by mouth daily.     fexofenadine (ALLEGRA) 180 MG tablet Take 180 mg by mouth daily.     fluticasone (FLONASE) 50 MCG/ACT nasal spray Place 2 sprays into both nostrils daily.     FOLIC ACID PO Take by mouth.     hydrOXYzine (ATARAX) 25 MG tablet Take 25-50 mg by mouth at bedtime.     methocarbamol (ROBAXIN) 500 MG tablet Take by mouth.     OMEPRAZOLE PO Take by mouth.     OXYGEN Inhale 2 L into the lungs at bedtime.     Probiotic Product (PROBIOTIC PO) Take by mouth.     No current facility-administered medications for this visit.    Allergies Allergies  Allergen Reactions   Dolobid [Diflunisal]    Biaxin [Clarithromycin] Nausea And Vomiting   Lyrica [Pregabalin] Swelling   Other Nausea And Vomiting    Vicoprofen   Oxycontin [Oxycodone] Nausea And Vomiting   Singulair [Montelukast] Nausea And Vomiting    Histories Past Medical History:  Diagnosis Date   Anxiety    Asthma    Ectopic pregnancy    Fibromyalgia    Glaucoma    Hypertension    OSA (obstructive sleep apnea)    o2 at 2.5 liters at night   Rheumatoid arthritis(714.0)    Past Surgical History:  Procedure Laterality Date   BACK SURGERY     BILIARY DILATION  01/30/2022   Procedure: BILIARY DILATION;  Surgeon: Irving Copas., MD;  Location: [place];  Service: Gastroenterology;;   BILIARY STENT PLACEMENT  01/30/2022   Procedure: BILIARY STENT PLACEMENT;  Surgeon: Irving Copas., MD;  Location: [place] [facility];  Service: Gastroenterology;;   BIOPSY  01/30/2022   Procedure: BIOPSY;  Surgeon: Irving Copas., MD;  Location: [place];  Service: Gastroenterology;;   CATARACT EXTRACTION     CATARACT EXTRACTION W/PHACO  01/16/2012   Procedure: CATARACT EXTRACTION PHACO AND INTRAOCULAR LENS PLACEMENT ([place]);  Surgeon: Williams Che, MD;  Location: [facility];  Service: Ophthalmology;  Laterality: Right;   CDE: 14.05   CERVICAL LAMINECTOMY     with plating   CHOLECYSTECTOMY     ECTOPIC PREGNANCY SURGERY     ERCP N/A 01/30/2022   Procedure: ENDOSCOPIC RETROGRADE CHOLANGIOPANCREATOGRAPHY (ERCP);  Surgeon: Irving Copas., MD;  Location: [place];  Service: Gastroenterology;  Laterality: N/A;   KNEE ARTHROSCOPY     REMOVAL OF STONES  01/30/2022   Procedure: REMOVAL OF STONES;  Surgeon: Rush Landmark Telford Nab., MD;  Location: [place];  Service: Gastroenterology;;   Joan Mayans  01/30/2022   Procedure: Joan Mayans;  Surgeon: Irving Copas., MD;  Location: [place];  Service: Gastroenterology;;   STONE EXTRACTION WITH BASKET  01/30/2022   Procedure: STONE EXTRACTION WITH BASKET;  Surgeon: Irving Copas., MD;  Location: [facility];  Service: Gastroenterology;;   TUBAL LIGATION     vericose vein surgery     Social History   Socioeconomic History   Marital status: Widowed    Spouse name: Not on file   Number of children: Not on file   Years of education: Not on file   Highest education level: Not on file  Occupational History   Not on file  Tobacco Use   Smoking status: Never   Smokeless tobacco: Never  Vaping Use   Vaping Use: Never used  Substance and Sexual Activity   Alcohol use: No   Drug use: No   Sexual activity: Not Currently    Birth control/protection: Post-menopausal, Surgical, Abstinence    Comment: tubal  Other Topics Concern   Not on file  Social History Narrative   Not on file   Social Determinants of Health   Financial Resource Strain: Low Risk  (04/23/2021)   Overall Financial Resource Strain (CARDIA)    Difficulty of Paying Living Expenses: Not hard at all  Food Insecurity: No Food Insecurity (01/30/2022)   Hunger Vital Sign    Worried About Running Out of Food in the Last Year: Never true    Ran Out of Food in the Last Year: Never true  Transportation Needs: No Transportation Needs (01/30/2022)   PRAPARE -  Hydrologist (Medical): No    Lack of Transportation (Non-Medical): No  Physical Activity: Insufficiently Active (04/23/2021)   Exercise Vital Sign    Days of Exercise per Week: 2 days    Minutes of Exercise per Session: 20 min  Stress: No Stress Concern Present (04/23/2021)   [place]    Feeling of Stress : Not at all  Social Connections: Moderately Integrated (04/23/2021)   Social Connection and Isolation Panel [NHANES]    Frequency of Communication with Friends and Family: Three times a week    Frequency of Social Gatherings with Friends and Family: Twice a week    Attends Religious Services: More than 4 times per year    Active Member of Clubs or Organizations: No    Attends Archivist Meetings: 1 to 4 times per year    Marital Status: Widowed  Intimate Partner Violence: Not At Risk (01/30/2022)   Humiliation, Afraid, Rape, and  Kick questionnaire    Fear of Current or Ex-Partner: No    Emotionally Abused: No    Physically Abused: No    Sexually Abused: No   Family History  Problem Relation Age of Onset   Arthritis Mother    Osteoporosis Mother    Fibromyalgia Mother    Parkinson's disease Father    Parkinson's disease Brother    Stroke Maternal Grandmother    Colon cancer Neg Hx    Esophageal cancer Neg Hx    Inflammatory bowel disease Neg Hx    Liver disease Neg Hx    Pancreatic cancer Neg Hx    Rectal cancer Neg Hx    Stomach cancer Neg Hx    I have reviewed her medical, social, and family history in detail and updated the electronic medical record as necessary.    PHYSICAL EXAMINATION  BP 136/76   Pulse 82   Ht 5' 1"$  (1.549 m)   Wt 146 lb (66.2 kg)   BMI 27.59 kg/m  Wt Readings from Last 3 Encounters:  03/30/22 146 lb (66.2 kg)  01/29/22 167 lb 15.9 oz (76.2 kg)  09/14/21 168 lb (76.2 kg)  GEN: NAD, appears stated age, doesn't appear chronically ill,  accompanied by sister PSYCH: Cooperative, without pressured speech EYE: Conjunctivae pink, sclerae anicteric ENT: MMM CV: Nontachycardic RESP: No audible wheezing GI: NABS, soft, NT/ND, without rebound MSK/EXT: No significant lower extremity edema SKIN: No jaundice NEURO:  Alert & Oriented x 3, no focal deficits   REVIEW OF DATA  I reviewed the following data at the time of this encounter:  GI Procedures and Studies  December 2023 ERCP Impression:               - Low-grade of narrowing Schatzki ring.                           - 3 cm hiatal hernia.                           - Gastritis. Biopsies for HP were taken.                           - No gross lesions in the duodenal bulb, in the                            first portion of the duodenum and in the second                            portion of the duodenum.                           - The major papilla was on the rim of a                            diverticulum. Otherwise it was normal in appearance.                           - Filling defects consistent with stones and sludge  were seen on the cholangiogram.                           - A single localized biliary stricture was found in                            the lower third of the main bile duct.                           - The upper third of the main bile duct and middle                            third of the main bile duct were moderately                            dilated. There was a sigmoidized upper biliary tree.                           - A duct discrepancy with distal narrowing was                            found.                           - Choledocholithiasis and cholangitis were found.                            Partial removal was accomplished with biliary                            sphincterotomy and balloon dilation and balloon                            sweep; 2 biliary stents were to allow for adequate                             decompression inserted.  Laboratory Studies  Reviewed those in epic  Imaging Studies  January 2024 MRI/MRCP IMPRESSION: Prior cholecystectomy. Mild decrease in diffuse biliary ductal dilatation compared to prior CT. Multiple calculi persist throughout the common bile duct, largest measuring 9 mm. Tiny hiatal hernia.   ASSESSMENT  Ms. Krzyzaniak is a 72 y.o. female with a pmh significant for RA, OSA, hypertension, fibromyalgia, status post cholecystectomy, choledocholithiasis (status post ERCP with sphincterotomy and stenting).  The patient is seen today for evaluation and management of:  1. Choledocholithiasis   2. History of biliary stent insertion   3. History of ERCP   4. Abnormal LFTs    The patient is clinically and hemodynamically stable.  She seems to be doing much better after her ERCP with biliary stenting in the setting of previous obstructive jaundice and cholangitis.  Her recent imaging shows no evidence of pancreatic mass or malignancy.  Will plan to proceed with ERCP in the coming weeks as already scheduled for biliary stent removal and attempt at further biliary stone extraction now that the bile duct is decompressed.  Her sigmoidized bile duct does make  things a little bit more difficult technically but we will see what we are able to do.  The risks of an ERCP were discussed at length, including but not limited to the risk of perforation, bleeding, abdominal pain, post-ERCP pancreatitis (while usually mild can be severe and even life threatening).  The risks and benefits of endoscopic evaluation were discussed with the patient; these include but are not limited to the risk of perforation, infection, bleeding, missed lesions, lack of diagnosis, severe illness requiring hospitalization, as well as anesthesia and sedation related illnesses.  The patient and/or family is agreeable to proceed.  All patient questions were answered to the best of my ability, and the patient agrees to the  aforementioned plan of action with follow-up as indicated.   PLAN  Laboratories as outlined below today Proceed with scheduled ERCP for biliary stent removal and attempt at final choledocholithiasis extraction Pending how patient is doing post procedure and in follow-up, we will consider the role of final colon cancer screening at some point in the coming months   Orders Placed This Encounter  Procedures   CBC   Comp Met (CMET)    New Prescriptions   No medications on file   Modified Medications   No medications on file    Planned Follow Up No follow-ups on file.   Total Time in Face-to-Face and in Coordination of Care for patient including independent/personal interpretation/review of prior testing, medical history, examination, medication adjustment, communicating results with the patient directly, and documentation within the EHR is 25 minutes.   Justice Britain, MD [facility] # [id number]

## 2022-03-31 ENCOUNTER — Encounter: Payer: Self-pay | Admitting: Gastroenterology

## 2022-04-03 ENCOUNTER — Encounter: Payer: Self-pay | Admitting: Gastroenterology

## 2022-04-03 DIAGNOSIS — R7989 Other specified abnormal findings of blood chemistry: Secondary | ICD-10-CM | POA: Insufficient documentation

## 2022-04-03 DIAGNOSIS — Z9889 Other specified postprocedural states: Secondary | ICD-10-CM | POA: Insufficient documentation

## 2022-04-06 NOTE — Anesthesia Preprocedure Evaluation (Signed)
Anesthesia Evaluation  Patient identified by MRN, date of birth, ID band Patient awake    Reviewed: Allergy & Precautions, NPO status , Patient's Chart, lab work & pertinent test results  History of Anesthesia Complications Negative for: history of anesthetic complications  Airway Mallampati: II  TM Distance: >3 FB Neck ROM: Full    Dental  (+) Missing,    Pulmonary asthma , sleep apnea and Oxygen sleep apnea    Pulmonary exam normal        Cardiovascular hypertension, Pt. on medications Normal cardiovascular exam     Neuro/Psych  Headaches  Anxiety        GI/Hepatic Neg liver ROS,GERD  Medicated,,choledocholithiasis   Endo/Other  negative endocrine ROS    Renal/GU negative Renal ROS  negative genitourinary   Musculoskeletal  (+) Arthritis , Rheumatoid disorders,  Fibromyalgia -  Abdominal   Peds  Hematology negative hematology ROS (+)   Anesthesia Other Findings glaucoma  Reproductive/Obstetrics negative OB ROS                              Anesthesia Physical Anesthesia Plan  ASA: 2  Anesthesia Plan: General   Post-op Pain Management: Minimal or no pain anticipated   Induction: Intravenous  PONV Risk Score and Plan: 3 and Treatment may vary due to age or medical condition, Ondansetron and Dexamethasone  Airway Management Planned: Oral ETT  Additional Equipment: None  Intra-op Plan:   Post-operative Plan: Extubation in OR  Informed Consent: I have reviewed the patients History and Physical, chart, labs and discussed the procedure including the risks, benefits and alternatives for the proposed anesthesia with the patient or authorized representative who has indicated his/her understanding and acceptance.       Plan Discussed with: CRNA  Anesthesia Plan Comments:         Anesthesia Quick Evaluation

## 2022-04-07 ENCOUNTER — Ambulatory Visit (HOSPITAL_COMMUNITY)
Admission: RE | Admit: 2022-04-07 | Discharge: 2022-04-07 | Disposition: A | Discharge status: Home / Self Care | Payer: Medicare Other | Attending: Gastroenterology | Admitting: Gastroenterology

## 2022-04-07 ENCOUNTER — Ambulatory Visit (HOSPITAL_COMMUNITY): Payer: Medicare Other | Admitting: Certified Registered Nurse Anesthetist

## 2022-04-07 ENCOUNTER — Ambulatory Visit (HOSPITAL_BASED_OUTPATIENT_CLINIC_OR_DEPARTMENT_OTHER): Payer: Medicare Other | Admitting: Certified Registered Nurse Anesthetist

## 2022-04-07 ENCOUNTER — Other Ambulatory Visit: Payer: Self-pay

## 2022-04-07 ENCOUNTER — Encounter (HOSPITAL_COMMUNITY)
Admission: RE | Disposition: A | Discharge status: Home / Self Care | Payer: Self-pay | Source: Home / Self Care | Attending: Gastroenterology

## 2022-04-07 ENCOUNTER — Telehealth: Payer: Self-pay

## 2022-04-07 ENCOUNTER — Ambulatory Visit (HOSPITAL_COMMUNITY): Payer: Medicare Other

## 2022-04-07 ENCOUNTER — Encounter (HOSPITAL_COMMUNITY): Payer: Self-pay | Admitting: Gastroenterology

## 2022-04-07 DIAGNOSIS — K8050 Calculus of bile duct without cholangitis or cholecystitis without obstruction: Secondary | ICD-10-CM

## 2022-04-07 DIAGNOSIS — Z9049 Acquired absence of other specified parts of digestive tract: Secondary | ICD-10-CM | POA: Diagnosis not present

## 2022-04-07 DIAGNOSIS — Z4659 Encounter for fitting and adjustment of other gastrointestinal appliance and device: Secondary | ICD-10-CM

## 2022-04-07 DIAGNOSIS — Z9889 Other specified postprocedural states: Secondary | ICD-10-CM

## 2022-04-07 DIAGNOSIS — K8020 Calculus of gallbladder without cholecystitis without obstruction: Secondary | ICD-10-CM | POA: Diagnosis not present

## 2022-04-07 DIAGNOSIS — M069 Rheumatoid arthritis, unspecified: Secondary | ICD-10-CM | POA: Diagnosis not present

## 2022-04-07 DIAGNOSIS — R7989 Other specified abnormal findings of blood chemistry: Secondary | ICD-10-CM

## 2022-04-07 DIAGNOSIS — G4733 Obstructive sleep apnea (adult) (pediatric): Secondary | ICD-10-CM | POA: Insufficient documentation

## 2022-04-07 DIAGNOSIS — M797 Fibromyalgia: Secondary | ICD-10-CM | POA: Insufficient documentation

## 2022-04-07 DIAGNOSIS — J45909 Unspecified asthma, uncomplicated: Secondary | ICD-10-CM | POA: Insufficient documentation

## 2022-04-07 DIAGNOSIS — K839 Disease of biliary tract, unspecified: Secondary | ICD-10-CM | POA: Diagnosis not present

## 2022-04-07 DIAGNOSIS — K838 Other specified diseases of biliary tract: Secondary | ICD-10-CM | POA: Diagnosis not present

## 2022-04-07 DIAGNOSIS — I10 Essential (primary) hypertension: Secondary | ICD-10-CM | POA: Insufficient documentation

## 2022-04-07 DIAGNOSIS — K8031 Calculus of bile duct with cholangitis, unspecified, with obstruction: Secondary | ICD-10-CM | POA: Diagnosis not present

## 2022-04-07 DIAGNOSIS — F419 Anxiety disorder, unspecified: Secondary | ICD-10-CM | POA: Insufficient documentation

## 2022-04-07 HISTORY — PX: BALLOON DILATION: SHX5330

## 2022-04-07 HISTORY — PX: STENT REMOVAL: SHX6421

## 2022-04-07 HISTORY — PX: REMOVAL OF STONES: SHX5545

## 2022-04-07 HISTORY — PX: ENDOSCOPIC RETROGRADE CHOLANGIOPANCREATOGRAPHY (ERCP) WITH PROPOFOL: SHX5810

## 2022-04-07 SURGERY — ENDOSCOPIC RETROGRADE CHOLANGIOPANCREATOGRAPHY (ERCP) WITH PROPOFOL
Anesthesia: General

## 2022-04-07 MED ORDER — FENTANYL CITRATE (PF) 100 MCG/2ML IJ SOLN: 25.0000 ug | INTRAMUSCULAR | Status: DC | PRN

## 2022-04-07 MED ORDER — GLUCAGON HCL RDNA (DIAGNOSTIC) 1 MG IJ SOLR
INTRAMUSCULAR | Status: DC | PRN
  Administered 2022-04-07 (×3): .25 mg via INTRAVENOUS

## 2022-04-07 MED ORDER — ROCURONIUM BROMIDE 10 MG/ML (PF) SYRINGE
PREFILLED_SYRINGE | INTRAVENOUS | Status: DC | PRN
  Administered 2022-04-07: 10 mg via INTRAVENOUS
  Administered 2022-04-07: 40 mg via INTRAVENOUS

## 2022-04-07 MED ORDER — SODIUM CHLORIDE 0.9 % IV SOLN: INTRAVENOUS | Status: DC

## 2022-04-07 MED ORDER — SODIUM CHLORIDE 0.9 % IV SOLN
INTRAVENOUS | Status: DC | PRN
  Administered 2022-04-07: 40 mL

## 2022-04-07 MED ORDER — LACTATED RINGERS IV SOLN
INTRAVENOUS | Status: DC
  Administered 2022-04-07: 1000 mL via INTRAVENOUS

## 2022-04-07 MED ORDER — DEXAMETHASONE SODIUM PHOSPHATE 10 MG/ML IJ SOLN
INTRAMUSCULAR | Status: DC | PRN
  Administered 2022-04-07: 5 mg via INTRAVENOUS

## 2022-04-07 MED ORDER — PROPOFOL 10 MG/ML IV BOLUS
INTRAVENOUS | Status: AC
  Filled 2022-04-07: qty 20

## 2022-04-07 MED ORDER — PHENYLEPHRINE HCL (PRESSORS) 10 MG/ML IV SOLN
INTRAVENOUS | Status: AC
  Filled 2022-04-07: qty 1

## 2022-04-07 MED ORDER — PROPOFOL 10 MG/ML IV BOLUS
INTRAVENOUS | Status: DC | PRN
  Administered 2022-04-07: 20 mg via INTRAVENOUS
  Administered 2022-04-07: 100 mg via INTRAVENOUS

## 2022-04-07 MED ORDER — DROPERIDOL 2.5 MG/ML IJ SOLN
0.6250 mg | Freq: Once | INTRAMUSCULAR | Status: DC | PRN
  Filled 2022-04-07: qty 2

## 2022-04-07 MED ORDER — SUGAMMADEX SODIUM 200 MG/2ML IV SOLN
INTRAVENOUS | Status: DC | PRN
  Administered 2022-04-07: 200 mg via INTRAVENOUS

## 2022-04-07 MED ORDER — LIDOCAINE 2% (20 MG/ML) 5 ML SYRINGE
INTRAMUSCULAR | Status: DC | PRN
  Administered 2022-04-07: 60 mg via INTRAVENOUS

## 2022-04-07 MED ORDER — CIPROFLOXACIN IN D5W 400 MG/200ML IV SOLN
INTRAVENOUS | Status: DC | PRN
  Administered 2022-04-07: 400 mg via INTRAVENOUS

## 2022-04-07 MED ORDER — CIPROFLOXACIN IN D5W 400 MG/200ML IV SOLN
INTRAVENOUS | Status: AC
  Filled 2022-04-07: qty 200

## 2022-04-07 MED ORDER — DICLOFENAC SUPPOSITORY 100 MG
RECTAL | Status: DC | PRN
  Administered 2022-04-07: 100 mg via RECTAL

## 2022-04-07 MED ORDER — PHENYLEPHRINE 80 MCG/ML (10ML) SYRINGE FOR IV PUSH (FOR BLOOD PRESSURE SUPPORT)
PREFILLED_SYRINGE | INTRAVENOUS | Status: DC | PRN
  Administered 2022-04-07: 80 ug via INTRAVENOUS

## 2022-04-07 MED ORDER — FENTANYL CITRATE (PF) 100 MCG/2ML IJ SOLN
INTRAMUSCULAR | Status: DC | PRN
  Administered 2022-04-07: 50 ug via INTRAVENOUS

## 2022-04-07 MED ORDER — FENTANYL CITRATE (PF) 100 MCG/2ML IJ SOLN
INTRAMUSCULAR | Status: AC
  Filled 2022-04-07: qty 2

## 2022-04-07 MED ORDER — DICLOFENAC SUPPOSITORY 100 MG
RECTAL | Status: AC
  Filled 2022-04-07: qty 1

## 2022-04-07 MED ORDER — ONDANSETRON HCL 4 MG/2ML IJ SOLN
INTRAMUSCULAR | Status: DC | PRN
  Administered 2022-04-07: 4 mg via INTRAVENOUS

## 2022-04-07 NOTE — Op Note (Signed)
[facility] Patient Name: [name] [name] Procedure Date: 04/07/2022 MRN: [id number] Attending MD: Justice Britain , MD, [id number] Date of Birth: 01/27/1951 CSN: [id number] Age: 72 Admit Type: Outpatient Procedure:                ERCP Indications:              Bile duct stone(s), Biliary stent removal Providers:                Justice Britain, MD, Burtis Junes, RN, Luan Moore, Technician Referring MD:             [employer], MD Medicines:                General Anesthesia, Cipro 400 mg IV, Diclofenac 100                            mg rectal, Glucagon [id number] mg IV Complications:            No immediate complications. Estimated Blood Loss:     Estimated blood loss was minimal. Procedure:                Pre-Anesthesia Assessment:                           - Prior to the procedure, a History and Physical                            was performed, and patient medications and                            allergies were reviewed. The patient's tolerance of                            previous anesthesia was also reviewed. The risks                            and benefits of the procedure and the sedation                            options and risks were discussed with the patient.                            All questions were answered, and informed consent                            was obtained. Prior Anticoagulants: The patient has                            taken no anticoagulant or antiplatelet agents                            except for aspirin. ASA Grade Assessment: III - A  patient with severe systemic disease. After                            reviewing the risks and benefits, the patient was                            deemed in satisfactory condition to undergo the                            procedure.                           After obtaining informed consent, the scope was                            passed  under direct vision. Throughout the                            procedure, the patient's blood pressure, pulse, and                            oxygen saturations were monitored continuously. The                            [id number]) Olympus duodenoscope was                            introduced through the mouth, and used to inject                            contrast into and used to inject contrast into the                            bile duct. The ERCP was accomplished without                            difficulty. The patient tolerated the procedure. Scope In: Scope Out: Findings:      A scout film of the abdomen was obtained. Surgical clips, consistent       with previous cholecystectomy, were seen in the area of the right upper       quadrant of the abdomen. Two stents ending in the main bile duct were       seen.      The esophagus was successfully intubated under direct vision without       detailed examination of the pharynx, larynx, and associated structures,       and upper GI tract. The upper GI tract was noted to have a hiatal hernia       and a significant J-shaped deformity. The major papilla was on the rim       of a diverticulum. A biliary sphincterotomy had been performed. The       sphincterotomy appeared open. Two plastic biliary stents originating in       the biliary tree were emerging from the major papilla. Two stents were       removed from the biliary tree  using a snare.      A short 0.035 inch Soft Jagwire was passed into the biliary tree. The       Hydratome sphincterotome was passed over the guidewire and the bile duct       was then deeply cannulated. Contrast was injected. I personally       interpreted the bile duct images. Ductal flow of contrast was adequate.       Image quality was adequate. Contrast extended to the hepatic ducts.       Opacification of the entire biliary tree except for the cystic duct and       gallbladder was successful. The  middle third of the main bile duct       contained filling defects thought to be stones and sludge. The middle       third of the main bile duct and upper third of the main bile duct were       moderately dilated. The largest diameter was 15 mm. Dilation of the       common bile duct as a sphincteroplasty with an 09-22-08 mm x 5.5 cm CRE       balloon (to a maximum balloon size of 10 mm) dilator was successful. To       discover objects, the biliary tree was swept with a retrieval balloon       (initially a 12-15 mm and then subsequently a 15-18 mm balloon). Sludge       was swept from the duct. Four stones were removed. No stones remained.       An occlusion cholangiogram was performed that showed no further       significant biliary pathology.      A pancreatogram was not performed.      The duodenoscope was withdrawn from the patient. Impression:               - The major papilla was on the rim of a                            diverticulum.                           - Prior biliary sphincterotomy appeared open.                           - Two stents from the biliary tree were seen in the                            major papilla. These were removed.                           - Fiilling defects consistent with stones and                            sludge were seen on the cholangiogram.                           - The upper third of the main bile duct and middle                            third  of the main bile duct were moderately dilated.                           - Choledocholithiasis was found. Complete removal                            was accomplished by balloon sphincteroplasty and                            balloow sweeping. Moderate Sedation:      Not Applicable - Patient had care per Anesthesia. Recommendation:           - The patient will be observed post-procedure,                            until all discharge criteria are met.                           - Discharge patient to  home.                           - Patient has a contact number available for                            emergencies. The signs and symptoms of potential                            delayed complications were discussed with the                            patient. Return to normal activities tomorrow.                            Written discharge instructions were provided to the                            patient.                           - Avoid aspirin and nonsteroidal anti-inflammatory                            medicines for 1 week.                           - Observe patient's clinical course.                           - Check liver enzymes (AST, ALT, alkaline                            phosphatase, bilirubin) in 2 weeks.                           - Watch for pancreatitis, bleeding, perforation,  and cholangitis.                           - Due to the size of the patient's still dilated                            biliary tree, the patient may remain at risk of                            biliary sludge formation because of her sigmoidized                            main bile duct. With this being said, hopefully the                            biliary sphincteroplasty will allow things to                            continue to drain appropriately.                           - Follow-up in clinic in the next few months to                            discuss consideration of colon cancer screening.                           - The findings and recommendations were discussed                            with the patient.                           - The findings and recommendations were discussed                            with the patient's family. Procedure Code(s):        --- Professional ---                           [phone], 29, Endoscopic retrograde                            cholangiopancreatography (ERCP); with                            trans-endoscopic balloon  dilation of                            biliary/pancreatic duct(s) or of ampulla                            (sphincteroplasty), including sphincterotomy, when                            performed, each  duct                           [id number], Endoscopic retrograde                            cholangiopancreatography (ERCP); with removal of                            foreign body(s) or stent(s) from biliary/pancreatic                            duct(s)                           43264, Endoscopic retrograde                            cholangiopancreatography (ERCP); with removal of                            calculi/debris from biliary/pancreatic duct(s)                           74328, 26, Endoscopic catheterization of the                            biliary ductal system, radiological supervision and                            interpretation Diagnosis Code(s):        --- Professional ---                           [id number], Presence of other specified functional                            implants                           K80.50, Calculus of bile duct without cholangitis                            or cholecystitis without obstruction                           Z46.59, Encounter for fitting and adjustment of                            other gastrointestinal appliance and device                           K83.8, Other specified diseases of biliary tract                           R93.2, Abnormal findings on diagnostic imaging of  liver and biliary tract CPT copyright 2022 American [employer] Association. All rights reserved. The codes documented in this report are preliminary and upon coder review may  be revised to meet current compliance requirements. Justice Britain, MD 04/07/2022 9:16:34 AM Number of Addenda: 0

## 2022-04-07 NOTE — Telephone Encounter (Signed)
The pt has been set up for repeat labs in 2 weeks. Follow up scheduled for 07/06/22 at 130 pm with GM. ERCP sent to Dr Gerarda Fraction.

## 2022-04-07 NOTE — Interval H&P Note (Signed)
History and Physical Interval Note:  04/07/2022 7:44 AM  [name] [name]  has presented today for surgery, with the diagnosis of choledocholithiasis.  The various methods of treatment have been discussed with the patient and family. After consideration of risks, benefits and other options for treatment, the patient has consented to  Procedure(s): ENDOSCOPIC RETROGRADE CHOLANGIOPANCREATOGRAPHY (ERCP) WITH PROPOFOL (N/A) as a surgical intervention.  The patient's history has been reviewed, patient examined, no change in status, stable for surgery.  I have reviewed the patient's chart and labs.  Questions were answered to the patient's satisfaction.     The risks of an ERCP were discussed at length, including but not limited to the risk of perforation, bleeding, abdominal pain, post-ERCP pancreatitis (while usually mild can be severe and even life threatening).    [employer]

## 2022-04-07 NOTE — Anesthesia Procedure Notes (Signed)
Procedure Name: Intubation Date/Time: 04/07/2022 7:56 AM  Performed by: Montel Clock, CRNAPre-anesthesia Checklist: Patient identified, Emergency Drugs available, Suction available, Patient being monitored and Timeout performed Patient Re-evaluated:Patient Re-evaluated prior to induction Oxygen Delivery Method: Circle system utilized Preoxygenation: Pre-oxygenation with 100% oxygen Induction Type: IV induction Ventilation: Mask ventilation without difficulty Laryngoscope Size: Mac and 3 Grade View: Grade I Tube type: Oral Tube size: 7.0 mm Number of attempts: 1 Airway Equipment and Method: Stylet Placement Confirmation: ETT inserted through vocal cords under direct vision, positive ETCO2 and breath sounds checked- equal and bilateral Secured at: 21 cm Tube secured with: Tape Dental Injury: Teeth and Oropharynx as per pre-operative assessment

## 2022-04-07 NOTE — Anesthesia Postprocedure Evaluation (Signed)
Anesthesia Post Note  Patient: [name] [name]  Procedure(s) Performed: ENDOSCOPIC RETROGRADE CHOLANGIOPANCREATOGRAPHY (ERCP) WITH PROPOFOL REMOVAL OF STONES BALLOON DILATION     Patient location during evaluation: PACU Anesthesia Type: General Level of consciousness: awake and alert Pain management: pain level controlled Vital Signs Assessment: post-procedure vital signs reviewed and stable Respiratory status: spontaneous breathing, nonlabored ventilation and respiratory function stable Cardiovascular status: blood pressure returned to baseline Postop Assessment: no apparent nausea or vomiting Anesthetic complications: no   No notable events documented.  Last Vitals:  Vitals:   04/07/22 0920 04/07/22 0929  BP: 129/66   Pulse: 65 64  Resp: 14 16  Temp:    SpO2: 100% 99%    Last Pain:  Vitals:   04/07/22 0920  TempSrc:   PainSc: 0-No pain                 Marthenia Rolling

## 2022-04-07 NOTE — Progress Notes (Signed)
Letter mailed to the pt with appt information and lab request

## 2022-04-07 NOTE — Telephone Encounter (Signed)
Left message on machine to call back  

## 2022-04-07 NOTE — Discharge Instructions (Signed)
YOU HAD AN ENDOSCOPIC PROCEDURE TODAY: Refer to the procedure report and other information in the discharge instructions given to you for any specific questions about what was found during the examination. If this information does not answer your questions, please call [place] office at [phone] to clarify.   YOU SHOULD EXPECT: Some feelings of bloating in the abdomen. Passage of more gas than usual. Walking can help get rid of the air that was put into your GI tract during the procedure and reduce the bloating. If you had a lower endoscopy (such as a colonoscopy or flexible sigmoidoscopy) you may notice spotting of blood in your stool or on the toilet paper. Some abdominal soreness may be present for a day or two, also.  DIET: Your first meal following the procedure should be a light meal and then it is ok to progress to your normal diet. A half-sandwich or bowl of soup is an example of a good first meal. Heavy or fried foods are harder to digest and may make you feel nauseous or bloated. Drink plenty of fluids but you should avoid alcoholic beverages for 24 hours. If you had a esophageal dilation, please see attached instructions for diet.    ACTIVITY: Your care partner should take you home directly after the procedure. You should plan to take it easy, moving slowly for the rest of the day. You can resume normal activity the day after the procedure however YOU SHOULD NOT DRIVE, use power tools, machinery or perform tasks that involve climbing or major physical exertion for 24 hours (because of the sedation medicines used during the test).   SYMPTOMS TO REPORT IMMEDIATELY: A gastroenterologist can be reached at any hour. Please call [phone]  for any of the following symptoms:   Following upper endoscopy (EGD, EUS, ERCP, esophageal dilation) Vomiting of blood or coffee ground material  New, significant abdominal pain  New, significant chest pain or pain under the shoulder blades  Painful or  persistently difficult swallowing  New shortness of breath  Black, tarry-looking or red, bloody stools  FOLLOW UP:  If any biopsies were taken you will be contacted by phone or by letter within the next 1-3 weeks. Call [phone]  if you have not heard about the biopsies in 3 weeks.  Please also call with any specific questions about appointments or follow up tests.  

## 2022-04-07 NOTE — Transfer of Care (Signed)
Immediate Anesthesia Transfer of Care Note  Patient: [name] [name]  Procedure(s) Performed: ENDOSCOPIC RETROGRADE CHOLANGIOPANCREATOGRAPHY (ERCP) WITH PROPOFOL REMOVAL OF STONES BALLOON DILATION  Patient Location: Endoscopy Unit  Anesthesia Type:General  Level of Consciousness: drowsy and patient cooperative  Airway & Oxygen Therapy: Patient Spontanous Breathing and Patient connected to face mask oxygen  Post-op Assessment: Report given to RN and Post -op Vital signs reviewed and stable  Post vital signs: Reviewed and stable  Last Vitals:  Vitals Value Taken Time  BP 124/65 04/07/22 0906  Temp    Pulse 65 04/07/22 0907  Resp 11 04/07/22 0907  SpO2 100 % 04/07/22 0907  Vitals shown include unvalidated device data.  Last Pain:  Vitals:   04/07/22 0647  TempSrc: Tympanic  PainSc: 0-No pain         Complications: No notable events documented.

## 2022-04-07 NOTE — Telephone Encounter (Signed)
-----   Message from Irving Copas., MD sent at 04/07/2022  9:27 AM EST ----- Regarding: Followup [name] [name], This patient needs a hepatic function panel in 2 weeks. Please forward the ERCP result to Dr. Gerarda Fraction her primary care provider. Please set up a follow-up in clinic with myself or one of the APP's in 6 to 12 weeks so that we can discuss colon cancer screening. Thanks. GM

## 2022-04-08 DIAGNOSIS — M47812 Spondylosis without myelopathy or radiculopathy, cervical region: Secondary | ICD-10-CM | POA: Diagnosis not present

## 2022-04-08 DIAGNOSIS — I700 Atherosclerosis of aorta: Secondary | ICD-10-CM | POA: Diagnosis not present

## 2022-04-08 DIAGNOSIS — K8309 Other cholangitis: Secondary | ICD-10-CM | POA: Diagnosis not present

## 2022-04-10 ENCOUNTER — Encounter (HOSPITAL_COMMUNITY): Payer: Self-pay | Admitting: Gastroenterology

## 2022-04-18 ENCOUNTER — Ambulatory Visit: Payer: Medicare Other | Admitting: Adult Health

## 2022-04-20 ENCOUNTER — Ambulatory Visit: Payer: Medicare Other | Admitting: Adult Health

## 2022-04-22 DIAGNOSIS — Z1331 Encounter for screening for depression: Secondary | ICD-10-CM | POA: Diagnosis not present

## 2022-04-22 DIAGNOSIS — E6609 Other obesity due to excess calories: Secondary | ICD-10-CM | POA: Diagnosis not present

## 2022-04-22 DIAGNOSIS — Z6832 Body mass index (BMI) 32.0-32.9, adult: Secondary | ICD-10-CM | POA: Diagnosis not present

## 2022-04-22 DIAGNOSIS — Z0001 Encounter for general adult medical examination with abnormal findings: Secondary | ICD-10-CM | POA: Diagnosis not present

## 2022-04-22 DIAGNOSIS — I700 Atherosclerosis of aorta: Secondary | ICD-10-CM | POA: Diagnosis not present

## 2022-04-22 DIAGNOSIS — M1991 Primary osteoarthritis, unspecified site: Secondary | ICD-10-CM | POA: Diagnosis not present

## 2022-04-22 DIAGNOSIS — G894 Chronic pain syndrome: Secondary | ICD-10-CM | POA: Diagnosis not present

## 2022-04-22 DIAGNOSIS — M5412 Radiculopathy, cervical region: Secondary | ICD-10-CM | POA: Diagnosis not present

## 2022-04-22 DIAGNOSIS — I10 Essential (primary) hypertension: Secondary | ICD-10-CM | POA: Diagnosis not present

## 2022-04-26 ENCOUNTER — Ambulatory Visit: Payer: Medicare Other | Admitting: Adult Health

## 2022-05-05 ENCOUNTER — Encounter: Payer: Self-pay | Admitting: Adult Health

## 2022-05-05 ENCOUNTER — Ambulatory Visit (INDEPENDENT_AMBULATORY_CARE_PROVIDER_SITE_OTHER): Payer: Medicare Other | Admitting: Adult Health

## 2022-05-05 VITALS — BP 133/67 | HR 77 | Ht 60.0 in | Wt 152.5 lb

## 2022-05-05 DIAGNOSIS — N814 Uterovaginal prolapse, unspecified: Secondary | ICD-10-CM

## 2022-05-05 DIAGNOSIS — Z4689 Encounter for fitting and adjustment of other specified devices: Secondary | ICD-10-CM

## 2022-05-05 NOTE — Progress Notes (Signed)
  Subjective:     Patient ID: [name] [name], female   DOB: [birth year], 72 y.o.   MRN: [id number]  HPI [name] [name] is a 72 year old white female, widowed, PM in for pessary maintenance has been over 7 months, since last, cleaning, has had surgery twice for choledocholithiasis.  PCP is Dr Gerarda Fraction.  Review of Systems For pessary maintenance Denies any vaginal bleeding,discharge or odor Pessary does come down with BM at times Reviewed past medical,surgical, social and family history. Reviewed medications and allergies.     Objective:   Physical Exam BP 133/67 (BP Location: Left Arm, Patient Position: Sitting, Cuff Size: Normal)   Pulse 77   Ht 5' (1.524 m)   Wt 152 lb 8 oz (69.2 kg)   BMI 29.78 kg/m      Skin warm and dry.Pelvic: external genitalia is normal in appearance no lesions, vagina: pessary removed, # 5 milex ring with support,no discharge,no lesions in vagina; +cystocele and prolapse, pessary washed and dried and easily inserted,urethra has no lesions or masses noted, cervix:smooth and bulbous, uterus: normal size, shape and contour, non tender, no masses felt, adnexa: no masses or tenderness noted. Bladder is non tender and no masses felt. Fall risk is low, she does use walker  [facility] - 05/05/22 1136       Pregnancy Intention Screening   Does the patient want to become pregnant in the next year? N/A    Does the patient's partner want to become pregnant in the next year? N/A    Would the patient like to discuss contraceptive options today? N/A      Contraception Wrap Up   Current Method Female Sterilization    End Method Female Sterilization    Contraception Counseling Provided No            Examination chaperoned by Levy Pupa LPN  Assessment:     1. Pessary maintenance Pessary cleaned and reinserted   2. Cystocele with uterine prolapse      Plan:     Return in 6 months for pessary maintenance or sooner if needed

## 2022-05-18 DIAGNOSIS — M5412 Radiculopathy, cervical region: Secondary | ICD-10-CM | POA: Diagnosis not present

## 2022-05-18 DIAGNOSIS — I719 Aortic aneurysm of unspecified site, without rupture: Secondary | ICD-10-CM | POA: Diagnosis not present

## 2022-05-18 DIAGNOSIS — I10 Essential (primary) hypertension: Secondary | ICD-10-CM | POA: Diagnosis not present

## 2022-05-18 DIAGNOSIS — G894 Chronic pain syndrome: Secondary | ICD-10-CM | POA: Diagnosis not present

## 2022-05-19 ENCOUNTER — Other Ambulatory Visit (HOSPITAL_COMMUNITY): Payer: Self-pay | Admitting: Internal Medicine

## 2022-05-19 DIAGNOSIS — M5412 Radiculopathy, cervical region: Secondary | ICD-10-CM

## 2022-06-16 ENCOUNTER — Encounter (HOSPITAL_COMMUNITY): Payer: Self-pay | Admitting: Internal Medicine

## 2022-06-17 DIAGNOSIS — M5412 Radiculopathy, cervical region: Secondary | ICD-10-CM | POA: Diagnosis not present

## 2022-06-17 DIAGNOSIS — I719 Aortic aneurysm of unspecified site, without rupture: Secondary | ICD-10-CM | POA: Diagnosis not present

## 2022-06-17 DIAGNOSIS — I10 Essential (primary) hypertension: Secondary | ICD-10-CM | POA: Diagnosis not present

## 2022-06-17 DIAGNOSIS — M1991 Primary osteoarthritis, unspecified site: Secondary | ICD-10-CM | POA: Diagnosis not present

## 2022-06-17 DIAGNOSIS — I700 Atherosclerosis of aorta: Secondary | ICD-10-CM | POA: Diagnosis not present

## 2022-06-17 DIAGNOSIS — Z6831 Body mass index (BMI) 31.0-31.9, adult: Secondary | ICD-10-CM | POA: Diagnosis not present

## 2022-06-17 DIAGNOSIS — G894 Chronic pain syndrome: Secondary | ICD-10-CM | POA: Diagnosis not present

## 2022-06-20 ENCOUNTER — Other Ambulatory Visit: Payer: Medicare Other

## 2022-07-04 ENCOUNTER — Telehealth: Payer: Self-pay | Admitting: Gastroenterology

## 2022-07-04 ENCOUNTER — Ambulatory Visit
Admission: RE | Admit: 2022-07-04 | Discharge: 2022-07-04 | Disposition: A | Discharge status: Home / Self Care | Payer: Medicare Other | Source: Ambulatory Visit | Attending: Internal Medicine | Admitting: Internal Medicine

## 2022-07-04 DIAGNOSIS — M5412 Radiculopathy, cervical region: Secondary | ICD-10-CM

## 2022-07-04 NOTE — Telephone Encounter (Signed)
The pt has been advised that lab order has been entered and sent to PCP

## 2022-07-04 NOTE — Telephone Encounter (Signed)
Patient would like lab orders sent to [facility] with Dr. Sherwood Gambler so she wouldn't have to drive to [facility] and can get it done by her PCP.

## 2022-07-06 ENCOUNTER — Ambulatory Visit: Payer: Medicare Other | Admitting: Gastroenterology

## 2022-07-13 DIAGNOSIS — I10 Essential (primary) hypertension: Secondary | ICD-10-CM | POA: Diagnosis not present

## 2022-07-13 DIAGNOSIS — M1991 Primary osteoarthritis, unspecified site: Secondary | ICD-10-CM | POA: Diagnosis not present

## 2022-07-13 DIAGNOSIS — G894 Chronic pain syndrome: Secondary | ICD-10-CM | POA: Diagnosis not present

## 2022-08-15 DIAGNOSIS — G894 Chronic pain syndrome: Secondary | ICD-10-CM | POA: Diagnosis not present

## 2022-08-15 DIAGNOSIS — I719 Aortic aneurysm of unspecified site, without rupture: Secondary | ICD-10-CM | POA: Diagnosis not present

## 2022-08-15 DIAGNOSIS — I10 Essential (primary) hypertension: Secondary | ICD-10-CM | POA: Diagnosis not present

## 2022-08-15 DIAGNOSIS — M5412 Radiculopathy, cervical region: Secondary | ICD-10-CM | POA: Diagnosis not present

## 2022-09-01 DIAGNOSIS — Z6828 Body mass index (BMI) 28.0-28.9, adult: Secondary | ICD-10-CM | POA: Diagnosis not present

## 2022-09-01 DIAGNOSIS — G959 Disease of spinal cord, unspecified: Secondary | ICD-10-CM | POA: Diagnosis not present

## 2022-09-13 DIAGNOSIS — E6609 Other obesity due to excess calories: Secondary | ICD-10-CM | POA: Diagnosis not present

## 2022-09-13 DIAGNOSIS — Z6830 Body mass index (BMI) 30.0-30.9, adult: Secondary | ICD-10-CM | POA: Diagnosis not present

## 2022-09-13 DIAGNOSIS — M5412 Radiculopathy, cervical region: Secondary | ICD-10-CM | POA: Diagnosis not present

## 2022-09-13 DIAGNOSIS — M1991 Primary osteoarthritis, unspecified site: Secondary | ICD-10-CM | POA: Diagnosis not present

## 2022-09-13 DIAGNOSIS — I700 Atherosclerosis of aorta: Secondary | ICD-10-CM | POA: Diagnosis not present

## 2022-09-13 DIAGNOSIS — G894 Chronic pain syndrome: Secondary | ICD-10-CM | POA: Diagnosis not present

## 2022-09-13 DIAGNOSIS — I10 Essential (primary) hypertension: Secondary | ICD-10-CM | POA: Diagnosis not present

## 2022-09-22 ENCOUNTER — Other Ambulatory Visit: Payer: Self-pay | Admitting: Neurological Surgery

## 2022-09-22 ENCOUNTER — Inpatient Hospital Stay: Admit: 2022-09-22 | Payer: Medicare Other | Admitting: Neurological Surgery

## 2022-09-22 DIAGNOSIS — M5011 Cervical disc disorder with radiculopathy,  high cervical region: Secondary | ICD-10-CM | POA: Diagnosis not present

## 2022-09-22 DIAGNOSIS — Z981 Arthrodesis status: Secondary | ICD-10-CM | POA: Diagnosis not present

## 2022-09-22 DIAGNOSIS — M50121 Cervical disc disorder at C4-C5 level with radiculopathy: Secondary | ICD-10-CM | POA: Diagnosis not present

## 2022-09-22 DIAGNOSIS — M4712 Other spondylosis with myelopathy, cervical region: Secondary | ICD-10-CM | POA: Diagnosis not present

## 2022-09-22 SURGERY — ANTERIOR CERVICAL DECOMPRESSION/DISCECTOMY FUSION 2 LEVELS
Anesthesia: General

## 2022-10-11 DIAGNOSIS — M1991 Primary osteoarthritis, unspecified site: Secondary | ICD-10-CM | POA: Diagnosis not present

## 2022-10-11 DIAGNOSIS — I10 Essential (primary) hypertension: Secondary | ICD-10-CM | POA: Diagnosis not present

## 2022-10-11 DIAGNOSIS — G894 Chronic pain syndrome: Secondary | ICD-10-CM | POA: Diagnosis not present

## 2022-10-11 DIAGNOSIS — M5412 Radiculopathy, cervical region: Secondary | ICD-10-CM | POA: Diagnosis not present

## 2022-10-19 ENCOUNTER — Ambulatory Visit: Payer: Medicare Other | Admitting: Gastroenterology

## 2022-10-26 ENCOUNTER — Encounter: Payer: Self-pay | Admitting: Gastroenterology

## 2022-10-26 ENCOUNTER — Other Ambulatory Visit (INDEPENDENT_AMBULATORY_CARE_PROVIDER_SITE_OTHER): Payer: Medicare Other

## 2022-10-26 ENCOUNTER — Ambulatory Visit (INDEPENDENT_AMBULATORY_CARE_PROVIDER_SITE_OTHER): Payer: Medicare Other | Admitting: Gastroenterology

## 2022-10-26 VITALS — BP 140/100 | HR 88 | Ht 61.0 in | Wt 150.0 lb

## 2022-10-26 DIAGNOSIS — Z1211 Encounter for screening for malignant neoplasm of colon: Secondary | ICD-10-CM | POA: Insufficient documentation

## 2022-10-26 DIAGNOSIS — K8050 Calculus of bile duct without cholangitis or cholecystitis without obstruction: Secondary | ICD-10-CM | POA: Diagnosis not present

## 2022-10-26 LAB — HEPATIC FUNCTION PANEL
ALT: 7 U/L (ref 0–35)
AST: 13 U/L (ref 0–37)
Albumin: 4 g/dL (ref 3.5–5.2)
Alkaline Phosphatase: 95 U/L (ref 39–117)
Bilirubin, Direct: 0.1 mg/dL (ref 0.0–0.3)
Total Bilirubin: 0.4 mg/dL (ref 0.2–1.2)
Total Protein: 6.9 g/dL (ref 6.0–8.3)

## 2022-10-26 NOTE — Progress Notes (Signed)
GASTROENTEROLOGY OUTPATIENT CLINIC VISIT   Primary Care Provider [name], MD [address] [place] [address] [phone]  Patient Profile: [name] [name] is a 72 y.o. female with a pmh significant for RA, OSA, hypertension, fibromyalgia, status post cholecystectomy, choledocholithiasis (status post ERCP with stone extraction).  The patient presents to the [facility] for an evaluation and management of problem(s) noted below:  Problem List 1. Choledocholithiasis   2. Colon cancer screening     History of Present Illness Please see prior GI notes for full details of HPI.  Interval History The patient is seen with her family member.  She did well after her last ERCP.  She has not done her liver tests as of yet but she has been feeling well.  She is not having any changes in her bowel habits or blood in her stools.  She is not experiencing any abdominal pain or discomfort.  GI Review of Systems Positive as above Negative for dysphagia, odynophagia, nausea, vomiting, pain, bloating, alteration of bowel habits, melena, hematochezia  Review of Systems General: Denies fevers/chills/weight loss unintentionally Cardiovascular: Denies chest pain Pulmonary: Denies shortness of breath Gastroenterological: See HPI Genitourinary: Denies darkened urine Hematological: Denies easy bruising/bleeding Dermatological: Denies jaundice Psychological: Mood is stable   Medications Current Outpatient Medications  Medication Sig Dispense Refill   aspirin-acetaminophen-caffeine (EXCEDRIN MIGRAINE) 250-250-65 MG tablet Take 1-2 tablets by mouth at bedtime as needed (persistent headaches.).     butalbital-acetaminophen-caffeine (FIORICET) 50-325-40 MG tablet Take 1 tablet by mouth 4 (four) times daily as needed for headache.     diltiazem (CARDIZEM) 120 MG tablet Take 360 mg by mouth in the morning.     fexofenadine (ALLEGRA) 180 MG tablet Take 180 mg by mouth  in the morning.     folic acid (FOLVITE) 800 MCG tablet Take 400 mcg by mouth daily.     methocarbamol (ROBAXIN) 500 MG tablet Take 500 mg by mouth at bedtime.     Naphazoline-Pheniramine (OPCON-A) 0.027-0.315 % SOLN Place 1-2 drops into both eyes 3 (three) times daily as needed (eye irritation.).     omeprazole (PRILOSEC) 20 MG capsule Take 20 mg by mouth daily before breakfast.     OXYGEN Inhale 2 L into the lungs at bedtime.     No current facility-administered medications for this visit.    Allergies Allergies  Allergen Reactions   Dolobid [Diflunisal] Nausea And Vomiting   Hydrocodone-Ibuprofen Nausea And Vomiting    Vicoprofen   Biaxin [Clarithromycin] Nausea And Vomiting   Lyrica [Pregabalin] Swelling   Oxycontin [Oxycodone] Nausea And Vomiting   Singulair [Montelukast] Nausea And Vomiting   Sulfamethoxazole-Trimethoprim Nausea Only    Histories Past Medical History:  Diagnosis Date   Anxiety    Asthma    Ectopic pregnancy    Fibromyalgia    Glaucoma    Hypertension    OSA (obstructive sleep apnea)    o2 at 2.5 liters at night   Rheumatoid arthritis(714.0)    Past Surgical History:  Procedure Laterality Date   BACK SURGERY     BALLOON DILATION N/A 04/07/2022   Procedure: BALLOON DILATION;  Surgeon: Lemar Lofty., MD;  Location: [facility] ENDOSCOPY;  Service: Gastroenterology;  Laterality: N/A;   BILIARY DILATION  01/30/2022   Procedure: BILIARY DILATION;  Surgeon: Meridee Score Netty Starring., MD;  Location: [facility] ENDOSCOPY;  Service: Gastroenterology;;   BILIARY STENT PLACEMENT  01/30/2022   Procedure: BILIARY STENT PLACEMENT;  Surgeon: Lemar Lofty., MD;  Location: [facility] ENDOSCOPY;  Service: Gastroenterology;;   BIOPSY  01/30/2022   Procedure: BIOPSY;  Surgeon: Lemar Lofty., MD;  Location: [facility] ENDOSCOPY;  Service: Gastroenterology;;   CATARACT EXTRACTION     CATARACT EXTRACTION W/PHACO  01/16/2012   Procedure: CATARACT EXTRACTION PHACO AND  INTRAOCULAR LENS PLACEMENT (IOC);  Surgeon: Susa Simmonds, MD;  Location: [facility] ORS;  Service: Ophthalmology;  Laterality: Right;  CDE: 14.05   CERVICAL LAMINECTOMY     with plating   CHOLECYSTECTOMY     ECTOPIC PREGNANCY SURGERY     ENDOSCOPIC RETROGRADE CHOLANGIOPANCREATOGRAPHY (ERCP) WITH PROPOFOL N/A 04/07/2022   Procedure: ENDOSCOPIC RETROGRADE CHOLANGIOPANCREATOGRAPHY (ERCP) WITH PROPOFOL;  Surgeon: Meridee Score Netty Starring., MD;  Location: [facility] ENDOSCOPY;  Service: Gastroenterology;  Laterality: N/A;   ERCP N/A 01/30/2022   Procedure: ENDOSCOPIC RETROGRADE CHOLANGIOPANCREATOGRAPHY (ERCP);  Surgeon: Lemar Lofty., MD;  Location: [facility] ENDOSCOPY;  Service: Gastroenterology;  Laterality: N/A;   KNEE ARTHROSCOPY     REMOVAL OF STONES  01/30/2022   Procedure: REMOVAL OF STONES;  Surgeon: Meridee Score Netty Starring., MD;  Location: [facility] ENDOSCOPY;  Service: Gastroenterology;;   REMOVAL OF STONES  04/07/2022   Procedure: REMOVAL OF STONES;  Surgeon: Lemar Lofty., MD;  Location: [facility] ENDOSCOPY;  Service: Gastroenterology;;   Dennison Mascot  01/30/2022   Procedure: Dennison Mascot;  Surgeon: Lemar Lofty., MD;  Location: [facility] ENDOSCOPY;  Service: Gastroenterology;;   Francine Graven REMOVAL  04/07/2022   Procedure: STENT REMOVAL;  Surgeon: Lemar Lofty., MD;  Location: [facility] ENDOSCOPY;  Service: Gastroenterology;;   STONE EXTRACTION WITH BASKET  01/30/2022   Procedure: STONE EXTRACTION WITH BASKET;  Surgeon: Lemar Lofty., MD;  Location: [facility] ENDOSCOPY;  Service: Gastroenterology;;   TUBAL LIGATION     vericose vein surgery     Social History   Socioeconomic History   Marital status: Widowed    Spouse name: Not on file   Number of children: Not on file   Years of education: Not on file   Highest education level: Not on file  Occupational History   Not on file  Tobacco Use   Smoking status: Never   Smokeless tobacco: Never  Vaping Use   Vaping status: Never Used   Substance and Sexual Activity   Alcohol use: No   Drug use: No   Sexual activity: Not Currently    Birth control/protection: Post-menopausal, Surgical, Abstinence    Comment: tubal  Other Topics Concern   Not on file  Social History Narrative   Not on file   Social Determinants of Health   Financial Resource Strain: Low Risk  (04/23/2021)   Overall Financial Resource Strain (CARDIA)    Difficulty of Paying Living Expenses: Not hard at all  Food Insecurity: No Food Insecurity (01/30/2022)   Hunger Vital Sign    Worried About Running Out of Food in the Last Year: Never true    Ran Out of Food in the Last Year: Never true  Transportation Needs: No Transportation Needs (01/30/2022)   PRAPARE - Administrator, Civil Service (Medical): No    Lack of Transportation (Non-Medical): No  Physical Activity: Insufficiently Active (04/23/2021)   Exercise Vital Sign    Days of Exercise per Week: 2 days    Minutes of Exercise per Session: 20 min  Stress: No Stress Concern Present (04/23/2021)   [employer] of Occupational Health - Occupational Stress Questionnaire    Feeling of Stress : Not at all  Social Connections: Moderately Integrated (04/23/2021)   Social Connection and Isolation Panel [  NHANES]    Frequency of Communication with Friends and Family: Three times a week    Frequency of Social Gatherings with Friends and Family: Twice a week    Attends Religious Services: More than 4 times per year    Active Member of Clubs or Organizations: No    Attends Banker Meetings: 1 to 4 times per year    Marital Status: Widowed  Intimate Partner Violence: Not At Risk (01/30/2022)   Humiliation, Afraid, Rape, and Kick questionnaire    Fear of Current or Ex-Partner: No    Emotionally Abused: No    Physically Abused: No    Sexually Abused: No   Family History  Problem Relation Age of Onset   Arthritis Mother    Osteoporosis Mother    Fibromyalgia Mother     Parkinson's disease Father    Parkinson's disease Brother    Stroke Maternal Grandmother    Colon cancer Neg Hx    Esophageal cancer Neg Hx    Inflammatory bowel disease Neg Hx    Liver disease Neg Hx    Pancreatic cancer Neg Hx    Rectal cancer Neg Hx    Stomach cancer Neg Hx    I have reviewed her medical, social, and family history in detail and updated the electronic medical record as necessary.    PHYSICAL EXAMINATION  BP (!) 140/100   Pulse 88   Ht 5\' 1"  (1.549 m)   Wt 150 lb (68 kg)   BMI 28.34 kg/m  Wt Readings from Last 3 Encounters:  10/26/22 150 lb (68 kg)  05/05/22 152 lb 8 oz (69.2 kg)  04/07/22 146 lb 2.6 oz (66.3 kg)  GEN: NAD, appears stated age, doesn't appear chronically ill, accompanied by sister PSYCH: Cooperative, without pressured speech EYE: Conjunctivae pink, sclerae anicteric ENT: MMM CV: Nontachycardic RESP: No audible wheezing GI: NABS, soft, NT/ND, without rebound MSK/EXT: No significant lower extremity edema SKIN: No jaundice NEURO:  Alert & Oriented x 3, no focal deficits   REVIEW OF DATA  I reviewed the following data at the time of this encounter:  GI Procedures and Studies  February 2020 for ERCP - The major papilla was on the rim of a diverticulum. - Prior biliary sphincterotomy appeared open. - Two stents from the biliary tree were seen in the major papilla. These were removed. - Fiilling defects consistent with stones and sludge were seen on the cholangiogram. - The upper third of the main bile duct and middle third of the main bile duct were moderately dilated. - Choledocholithiasis was found. Complete removal was accomplished by balloon sphincteroplasty and balloon sweeping.  Laboratory Studies  Reviewed those in epic  Imaging Studies  No new imaging studies to review   ASSESSMENT  Ms. Moyer is a 72 y.o. female with a pmh significant for RA, OSA, hypertension, fibromyalgia, status post cholecystectomy, choledocholithiasis  (status post ERCP with stone extraction).  The patient is seen today for evaluation and management of:  1. Choledocholithiasis   2. Colon cancer screening    The patient is hemodynamically and clinically stable.  She is doing well and has not had any issues post a second ERCP done earlier this year.  I think this will be the end of her bile duct saga.  She has pending hepatic function panel to be drawn at her convenience (she will get done today).  She is still relatively healthy and I do think that at least another colon cancer screening discussion  is reasonable.  We had that today.  She is elected to move forward with Cologuard testing as she is otherwise an average risk individual.  She does understand that if Cologuard returns positive she would need to undergo a colonoscopy and is willing to proceed with that if needed.  We will order the Cologuard at this time.  All patient questions were answered to the best of my ability, and the patient agrees to the aforementioned plan of action with follow-up as indicated.   PLAN  Proceed with hepatic function panel Proceed with Cologuard for colon cancer screening Follow-up to be dictated based on results of both of these studies   Orders Placed This Encounter  Procedures   Hepatic function panel   Cologuard    New Prescriptions   No medications on file   Modified Medications   No medications on file    Planned Follow Up No follow-ups on file.   Total Time in Face-to-Face and in Coordination of Care for patient including independent/personal interpretation/review of prior testing, medical history, examination, medication adjustment, communicating results with the patient directly, and documentation within the EHR is 25 minutes.   Corliss Parish, MD [facility] Office # [phone]

## 2022-10-26 NOTE — Patient Instructions (Signed)
Your provider has ordered Cologuard testing as an option for colon cancer screening. This is performed by [employer] and may be out of network with your insurance. PRIOR to completing the test, it is YOUR responsibility to contact your insurance about covered benefits for this test. Your out of pocket expense could be anywhere from $0.00 to $649.00.   When you call to check coverage with your insurer, please provide the following information:   -The ONLY provider of Cologuard is Opt[facility], dispensing  - CPT code for Cologuard is [phone].  [facility] [employer] NPI # [id number]  -[employer] Tax ID # [id number]   We have already sent your demographic and insurance information to [facility] (phone number [phone]) and they should contact you within the next week regarding your test. If you have not heard from them within the next week, please call our office at [phone].   Your provider has requested that you go to the basement level for lab work before leaving today. Press "B" on the elevator. The lab is located at the first door on the left as you exit the elevator.  Due to recent changes in healthcare laws, you may see the results of your imaging and laboratory studies on MyChart before your provider has had a chance to review them.  We understand that in some cases there may be results that are confusing or concerning to you. Not all laboratory results come back in the same time frame and the provider may be waiting for multiple results in order to interpret others.  Please give [place] 48 hours in order for your provider to thoroughly review all the results before contacting the office for clarification of your results.   _______________________________________________________  If your blood pressure at your visit was 140/90 or greater, please contact your primary care physician to follow up on  this.  _______________________________________________________  If you are age 53 or older, your body mass index should be between 23-30. Your Body mass index is 28.34 kg/m. If this is out of the aforementioned range listed, please consider follow up with your Primary Care Provider.  If you are age 66 or younger, your body mass index should be between 19-25. Your Body mass index is 28.34 kg/m. If this is out of the aformentioned range listed, please consider follow up with your Primary Care Provider.   ________________________________________________________  The [facility] providers would like to encourage you to use [facility] to communicate with providers for non-urgent requests or questions.  Due to long hold times on the telephone, sending your provider a message by [facility] may be a faster and more efficient way to get a response.  Please allow 48 business hours for a response.  Please remember that this is for non-urgent requests.  _______________________________________________________     Thank you for choosing me and [facility].  Dr. Meridee Score

## 2022-11-03 ENCOUNTER — Ambulatory Visit: Payer: Medicare Other | Admitting: Adult Health

## 2022-11-07 ENCOUNTER — Ambulatory Visit: Payer: Medicare Other | Admitting: Adult Health

## 2022-11-08 DIAGNOSIS — M1991 Primary osteoarthritis, unspecified site: Secondary | ICD-10-CM | POA: Diagnosis not present

## 2022-11-08 DIAGNOSIS — G894 Chronic pain syndrome: Secondary | ICD-10-CM | POA: Diagnosis not present

## 2022-11-08 DIAGNOSIS — I10 Essential (primary) hypertension: Secondary | ICD-10-CM | POA: Diagnosis not present

## 2022-11-08 DIAGNOSIS — M5412 Radiculopathy, cervical region: Secondary | ICD-10-CM | POA: Diagnosis not present

## 2022-12-02 DIAGNOSIS — M5412 Radiculopathy, cervical region: Secondary | ICD-10-CM | POA: Diagnosis not present

## 2022-12-02 DIAGNOSIS — G894 Chronic pain syndrome: Secondary | ICD-10-CM | POA: Diagnosis not present

## 2022-12-02 DIAGNOSIS — I719 Aortic aneurysm of unspecified site, without rupture: Secondary | ICD-10-CM | POA: Diagnosis not present

## 2022-12-02 DIAGNOSIS — I700 Atherosclerosis of aorta: Secondary | ICD-10-CM | POA: Diagnosis not present

## 2022-12-07 ENCOUNTER — Ambulatory Visit: Payer: Medicare Other | Admitting: Adult Health

## 2022-12-13 ENCOUNTER — Encounter: Payer: Self-pay | Admitting: Adult Health

## 2022-12-13 ENCOUNTER — Ambulatory Visit: Payer: Medicare Other | Admitting: Adult Health

## 2022-12-13 VITALS — BP 135/86 | HR 86 | Ht 60.0 in | Wt 143.0 lb

## 2022-12-13 DIAGNOSIS — N814 Uterovaginal prolapse, unspecified: Secondary | ICD-10-CM

## 2022-12-13 DIAGNOSIS — Z4689 Encounter for fitting and adjustment of other specified devices: Secondary | ICD-10-CM

## 2022-12-13 NOTE — Progress Notes (Signed)
Subjective:     Patient ID: [name] [name], female   DOB: April 14, 1950, 72 y.o.   MRN: [id number]  HPI [name] [name] is a 72 year old white female, widowed, PM in for pessary maintenance.  PCP is Dr Sherwood Gambler.  Review of Systems For pessary maintenance Denies any vaginal bleeding,discharge or odor   Reviewed past medical,surgical, social and family history. Reviewed medications and allergies.  Objective:   Physical Exam BP 135/86 (BP Location: Left Arm, Patient Position: Sitting, Cuff Size: Normal)   Pulse 86   Ht 5' (1.524 m)   Wt 143 lb (64.9 kg)   BMI 27.93 kg/m      Skin warm and dry.Pelvic: external genitalia is normal in appearance no lesions, vagina: pessary removed, # 5 milex ring with support,no discharge,no lesions in vagina; +cystocele and prolapse, pessary washed and dried and easily inserted,urethra has no lesions or masses noted, cervix:smooth and bulbous, uterus: normal size, shape and contour, non tender, no masses felt, adnexa: no masses or tenderness noted. Bladder is non tender and no masses felt. Fall risk is moderate, she does use walker  Upstream - 12/13/22 1013       Pregnancy Intention Screening   Does the patient want to become pregnant in the next year? N/A    Does the patient's partner want to become pregnant in the next year? N/A    Would the patient like to discuss contraceptive options today? N/A      Contraception Wrap Up   Current Method Abstinence;Female Sterilization   PM   End Method Abstinence;Female Sterilization   PM   Contraception Counseling Provided No            Examination chaperoned by Malachy Mood LPN  Assessment:     1. Pessary maintenance Pessary cleaned and reinserted   2. Cystocele with uterine prolapse     Plan:     Return in 6 months for pessary maintenance or sooner if needed

## 2022-12-14 DIAGNOSIS — G959 Disease of spinal cord, unspecified: Secondary | ICD-10-CM | POA: Diagnosis not present

## 2022-12-14 DIAGNOSIS — Z6827 Body mass index (BMI) 27.0-27.9, adult: Secondary | ICD-10-CM | POA: Diagnosis not present

## 2022-12-28 DIAGNOSIS — G9332 Myalgic encephalomyelitis/chronic fatigue syndrome: Secondary | ICD-10-CM | POA: Diagnosis not present

## 2022-12-28 DIAGNOSIS — D518 Other vitamin B12 deficiency anemias: Secondary | ICD-10-CM | POA: Diagnosis not present

## 2022-12-28 DIAGNOSIS — E559 Vitamin D deficiency, unspecified: Secondary | ICD-10-CM | POA: Diagnosis not present

## 2022-12-28 DIAGNOSIS — Z0001 Encounter for general adult medical examination with abnormal findings: Secondary | ICD-10-CM | POA: Diagnosis not present

## 2023-01-04 DIAGNOSIS — M5412 Radiculopathy, cervical region: Secondary | ICD-10-CM | POA: Diagnosis not present

## 2023-01-04 DIAGNOSIS — I700 Atherosclerosis of aorta: Secondary | ICD-10-CM | POA: Diagnosis not present

## 2023-01-04 DIAGNOSIS — M47812 Spondylosis without myelopathy or radiculopathy, cervical region: Secondary | ICD-10-CM | POA: Diagnosis not present

## 2023-01-04 DIAGNOSIS — I10 Essential (primary) hypertension: Secondary | ICD-10-CM | POA: Diagnosis not present

## 2023-01-04 DIAGNOSIS — N390 Urinary tract infection, site not specified: Secondary | ICD-10-CM | POA: Diagnosis not present

## 2023-01-04 DIAGNOSIS — I719 Aortic aneurysm of unspecified site, without rupture: Secondary | ICD-10-CM | POA: Diagnosis not present

## 2023-01-04 DIAGNOSIS — Z6832 Body mass index (BMI) 32.0-32.9, adult: Secondary | ICD-10-CM | POA: Diagnosis not present

## 2023-01-04 DIAGNOSIS — M1991 Primary osteoarthritis, unspecified site: Secondary | ICD-10-CM | POA: Diagnosis not present

## 2023-01-04 DIAGNOSIS — E6609 Other obesity due to excess calories: Secondary | ICD-10-CM | POA: Diagnosis not present

## 2023-01-04 DIAGNOSIS — G894 Chronic pain syndrome: Secondary | ICD-10-CM | POA: Diagnosis not present

## 2023-01-04 DIAGNOSIS — Z23 Encounter for immunization: Secondary | ICD-10-CM | POA: Diagnosis not present

## 2023-01-26 DIAGNOSIS — G894 Chronic pain syndrome: Secondary | ICD-10-CM | POA: Diagnosis not present

## 2023-01-26 DIAGNOSIS — I10 Essential (primary) hypertension: Secondary | ICD-10-CM | POA: Diagnosis not present

## 2023-01-26 DIAGNOSIS — M1991 Primary osteoarthritis, unspecified site: Secondary | ICD-10-CM | POA: Diagnosis not present

## 2023-01-26 DIAGNOSIS — M5412 Radiculopathy, cervical region: Secondary | ICD-10-CM | POA: Diagnosis not present

## 2023-02-28 ENCOUNTER — Telehealth: Payer: Self-pay

## 2023-02-28 NOTE — Telephone Encounter (Signed)
 Patient would like for [name] [name] to call her, she has some concerns about her pessary.  Please call the home phone.

## 2023-02-28 NOTE — Telephone Encounter (Signed)
 No answer

## 2023-03-02 NOTE — Telephone Encounter (Signed)
No answer @ 9:39 am. JSY

## 2023-03-02 NOTE — Telephone Encounter (Signed)
Pt hates her pessary. When she walks or sits down, she feels it. When she urinates, she feels like she can't empty bladder. When she has a BM, she feels like she is going to push pessary out. Pt wants to have surgery. Please advise. Thanks! JSY

## 2023-03-03 NOTE — Telephone Encounter (Signed)
[name] [name] says she hates the pessary now, thinking about surgery,  will get appointment with Dr Despina Hidden to assess, and discuss options.

## 2023-03-10 ENCOUNTER — Ambulatory Visit: Payer: Medicare Other | Admitting: Obstetrics & Gynecology

## 2023-03-13 ENCOUNTER — Ambulatory Visit: Payer: Medicare PPO | Admitting: Obstetrics & Gynecology

## 2023-03-21 ENCOUNTER — Ambulatory Visit: Payer: Medicare PPO | Admitting: Obstetrics & Gynecology

## 2023-03-21 ENCOUNTER — Encounter: Payer: Self-pay | Admitting: Obstetrics & Gynecology

## 2023-03-21 VITALS — BP 134/74 | HR 70 | Ht 61.0 in | Wt 152.0 lb

## 2023-03-21 DIAGNOSIS — N814 Uterovaginal prolapse, unspecified: Secondary | ICD-10-CM | POA: Diagnosis not present

## 2023-03-21 NOTE — Progress Notes (Signed)
 Follow up appointment for results: POP/pessary  Chief Complaint  Patient presents with   discuss surgery    Doesn't want pessary    Blood pressure 134/74, pulse 70, height 5' 1 (1.549 m), weight 152 lb (68.9 kg).  Pt has had the pessary in for about 2 years( see 04/23/2021 note)  for cystocoele and uterine prolapse However recently she has been having increasing amounts of discomfort, pressure and took it out and feels better She wants to be considered for surgical repair of her POP  We discussed options and she would like to see the urogynecology service down in [place] and a referral has been made top them  MEDS ordered this encounter: No orders of the defined types were placed in this encounter.   Orders for this encounter: Orders Placed This Encounter  Procedures   Ambulatory referral to Urogynecology    Impression + Management Plan   ICD-10-CM   1. Cystocele with uterine prolapse  N81.4 Ambulatory referral to Urogynecology      Follow Up: Return if symptoms worsen or fail to improve, for referral to Urogynecology is made.     All questions were answered.  Past Medical History:  Diagnosis Date   Anxiety    Asthma    Ectopic pregnancy    Fibromyalgia    Glaucoma    Hypertension    OSA (obstructive sleep apnea)    o2 at 2.5 liters at night   Rheumatoid arthritis(714.0)     Past Surgical History:  Procedure Laterality Date   BACK SURGERY     BALLOON DILATION N/A 04/07/2022   Procedure: BALLOON DILATION;  Surgeon: Wilhelmenia Aloha Raddle., MD;  Location: [facility];  Service: Gastroenterology;  Laterality: N/A;   BILIARY DILATION  01/30/2022   Procedure: BILIARY DILATION;  Surgeon: Wilhelmenia Aloha Raddle., MD;  Location: [facility] ENDOSCOPY;  Service: Gastroenterology;;   BILIARY STENT PLACEMENT  01/30/2022   Procedure: BILIARY STENT PLACEMENT;  Surgeon: Wilhelmenia Aloha Raddle., MD;  Location: [facility] ENDOSCOPY;  Service: Gastroenterology;;   BIOPSY  01/30/2022    Procedure: BIOPSY;  Surgeon: Wilhelmenia Aloha Raddle., MD;  Location: [facility] ENDOSCOPY;  Service: Gastroenterology;;   CATARACT EXTRACTION     CATARACT EXTRACTION W/PHACO  01/16/2012   Procedure: CATARACT EXTRACTION PHACO AND INTRAOCULAR LENS PLACEMENT (IOC);  Surgeon: Dow JULIANNA Burke, MD;  Location: [facility];  Service: Ophthalmology;  Laterality: Right;  CDE: 14.05   CERVICAL LAMINECTOMY     with plating   CHOLECYSTECTOMY     ECTOPIC PREGNANCY SURGERY     ENDOSCOPIC RETROGRADE CHOLANGIOPANCREATOGRAPHY (ERCP) WITH PROPOFOL  N/A 04/07/2022   Procedure: ENDOSCOPIC RETROGRADE CHOLANGIOPANCREATOGRAPHY (ERCP) WITH PROPOFOL ;  Surgeon: Wilhelmenia Aloha Raddle., MD;  Location: [facility];  Service: Gastroenterology;  Laterality: N/A;   ERCP N/A 01/30/2022   Procedure: ENDOSCOPIC RETROGRADE CHOLANGIOPANCREATOGRAPHY (ERCP);  Surgeon: Wilhelmenia Aloha Raddle., MD;  Location: [facility]OSCOPY;  Service: Gastroenterology;  Laterality: N/A;   KNEE ARTHROSCOPY     REMOVAL OF STONES  01/30/2022   Procedure: REMOVAL OF STONES;  Surgeon: Wilhelmenia Aloha Raddle., MD;  Location: [facility] ENDOSCOPY;  Service: Gastroenterology;;   REMOVAL OF STONES  04/07/2022   Procedure: REMOVAL OF STONES;  Surgeon: Wilhelmenia Aloha Raddle., MD;  Location: [facility];  Service: Gastroenterology;;   ANNETT  01/30/2022   Procedure: ANNETT;  Surgeon: Wilhelmenia Aloha Raddle., MD;  Location: [facility] ENDOSCOPY;  Service: Gastroenterology;;   CLEDA REMOVAL  04/07/2022   Procedure: STENT REMOVAL;  Surgeon: Wilhelmenia Aloha Raddle., MD;  Location: [facility] ENDOSCOPY;  Service: Gastroenterology;;  STONE EXTRACTION WITH BASKET  01/30/2022   Procedure: STONE EXTRACTION WITH BASKET;  Surgeon: Mansouraty, Aloha Raddle., MD;  Location: [facility] ENDOSCOPY;  Service: Gastroenterology;;   TUBAL LIGATION     vericose vein surgery      OB History     Gravida  3   Para  2   Term  2   Preterm      AB  1   Living  2      SAB  1   IAB      Ectopic       Multiple      Live Births  2           Allergies  Allergen Reactions   Dolobid [Diflunisal] Nausea And Vomiting   Hydrocodone-Ibuprofen  Nausea And Vomiting    Vicoprofen   Biaxin [Clarithromycin] Nausea And Vomiting   Lyrica [Pregabalin] Swelling   Oxycontin  [Oxycodone ] Nausea And Vomiting   Singulair [Montelukast] Nausea And Vomiting   Sulfamethoxazole -Trimethoprim  Nausea Only    Social History   Socioeconomic History   Marital status: Widowed    Spouse name: Not on file   Number of children: Not on file   Years of education: Not on file   Highest education level: Not on file  Occupational History   Not on file  Tobacco Use   Smoking status: Never   Smokeless tobacco: Never  Vaping Use   Vaping status: Never Used  Substance and Sexual Activity   Alcohol  use: No   Drug use: No   Sexual activity: Not Currently    Birth control/protection: Post-menopausal, Surgical, Abstinence    Comment: tubal  Other Topics Concern   Not on file  Social History Narrative   Not on file   Social Drivers of Health   Financial Resource Strain: Low Risk  (04/23/2021)   Overall Financial Resource Strain (CARDIA)    Difficulty of Paying Living Expenses: Not hard at all  Food Insecurity: No Food Insecurity (01/30/2022)   Hunger Vital Sign    Worried About Running Out of Food in the Last Year: Never true    Ran Out of Food in the Last Year: Never true  Transportation Needs: No Transportation Needs (01/30/2022)   PRAPARE - Administrator, Civil Service (Medical): No    Lack of Transportation (Non-Medical): No  Physical Activity: Insufficiently Active (04/23/2021)   Exercise Vital Sign    Days of Exercise per Week: 2 days    Minutes of Exercise per Session: 20 min  Stress: No Stress Concern Present (04/23/2021)   [employer] of Occupational Health - Occupational Stress Questionnaire    Feeling of Stress : Not at all  Social Connections: Moderately Integrated  (04/23/2021)   Social Connection and Isolation Panel [NHANES]    Frequency of Communication with Friends and Family: Three times a week    Frequency of Social Gatherings with Friends and Family: Twice a week    Attends Religious Services: More than 4 times per year    Active Member of Clubs or Organizations: No    Attends Banker Meetings: 1 to 4 times per year    Marital Status: Widowed    Family History  Problem Relation Age of Onset   Arthritis Mother    Osteoporosis Mother    Fibromyalgia Mother    Parkinson's disease Father    Parkinson's disease Brother    Stroke Maternal Grandmother    Colon cancer Neg Hx    Esophageal  cancer Neg Hx    Inflammatory bowel disease Neg Hx    Liver disease Neg Hx    Pancreatic cancer Neg Hx    Rectal cancer Neg Hx    Stomach cancer Neg Hx

## 2023-04-11 ENCOUNTER — Encounter: Payer: Self-pay | Admitting: Adult Health

## 2023-04-11 ENCOUNTER — Ambulatory Visit: Payer: Medicare PPO | Admitting: Adult Health

## 2023-04-11 VITALS — BP 121/80 | HR 80 | Ht 60.0 in | Wt 150.0 lb

## 2023-04-11 DIAGNOSIS — Z4689 Encounter for fitting and adjustment of other specified devices: Secondary | ICD-10-CM

## 2023-04-11 DIAGNOSIS — N814 Uterovaginal prolapse, unspecified: Secondary | ICD-10-CM

## 2023-04-11 NOTE — Progress Notes (Signed)
  Subjective:     Patient ID: [name] [name], female   DOB: March 11, 1950, 73 y.o.   MRN: [id number]  HPI [name] is a 73 year old white female, widowed, PM in requesting her pessary be reinserted. She had it removed 03/21/23. Has push back in to pee.   PCP is Dr Sherwood Gambler.  Review of Systems Uterus falling out Reviewed past medical,surgical, social and family history. Reviewed medications and allergies.     Objective:   Physical Exam BP 121/80 (BP Location: Left Arm, Patient Position: Sitting, Cuff Size: Normal)   Pulse 80   Ht 5' (1.524 m)   Wt 150 lb (68 kg)   BMI 29.29 kg/m     Skin warm and dry.Pelvic: external genitalia is normal in appearance no lesions, vagina: +prolapse #5 ring with support easily inserted.  Fall risk is low, using walker  Upstream - 04/11/23 1140       Pregnancy Intention Screening   Does the patient want to become pregnant in the next year? N/A    Does the patient's partner want to become pregnant in the next year? N/A    Would the patient like to discuss contraceptive options today? N/A      Contraception Wrap Up   Current Method Abstinence;Female Sterilization    End Method Abstinence;Female Sterilization    Contraception Counseling Provided No            Examination chaperoned by Malachy Mood LPN  Assessment:     1. Cystocele with uterine prolapse +prolapse and cystocele  She has appt 07/05/23 with Dr Olena Leatherwood   2. Pessary maintenance (Primary) # 5 milex ring with support reinserted     Plan:     Follow up as scheduled in April

## 2023-04-20 ENCOUNTER — Ambulatory Visit: Admitting: Obstetrics & Gynecology

## 2023-05-17 DIAGNOSIS — M1991 Primary osteoarthritis, unspecified site: Secondary | ICD-10-CM | POA: Diagnosis not present

## 2023-05-17 DIAGNOSIS — M47812 Spondylosis without myelopathy or radiculopathy, cervical region: Secondary | ICD-10-CM | POA: Diagnosis not present

## 2023-05-17 DIAGNOSIS — I10 Essential (primary) hypertension: Secondary | ICD-10-CM | POA: Diagnosis not present

## 2023-05-17 DIAGNOSIS — M5412 Radiculopathy, cervical region: Secondary | ICD-10-CM | POA: Diagnosis not present

## 2023-05-17 DIAGNOSIS — G894 Chronic pain syndrome: Secondary | ICD-10-CM | POA: Diagnosis not present

## 2023-05-29 DIAGNOSIS — M1991 Primary osteoarthritis, unspecified site: Secondary | ICD-10-CM | POA: Diagnosis not present

## 2023-05-29 DIAGNOSIS — M5412 Radiculopathy, cervical region: Secondary | ICD-10-CM | POA: Diagnosis not present

## 2023-05-29 DIAGNOSIS — N813 Complete uterovaginal prolapse: Secondary | ICD-10-CM | POA: Diagnosis not present

## 2023-05-29 DIAGNOSIS — G894 Chronic pain syndrome: Secondary | ICD-10-CM | POA: Diagnosis not present

## 2023-05-29 DIAGNOSIS — I10 Essential (primary) hypertension: Secondary | ICD-10-CM | POA: Diagnosis not present

## 2023-06-13 ENCOUNTER — Ambulatory Visit: Payer: Medicare Other | Admitting: Adult Health

## 2023-06-13 ENCOUNTER — Ambulatory Visit: Admitting: Adult Health

## 2023-06-14 ENCOUNTER — Encounter: Payer: Self-pay | Admitting: Adult Health

## 2023-06-14 ENCOUNTER — Ambulatory Visit: Admitting: Adult Health

## 2023-06-14 VITALS — BP 122/79 | HR 80 | Ht 60.0 in | Wt 150.0 lb

## 2023-06-14 DIAGNOSIS — N814 Uterovaginal prolapse, unspecified: Secondary | ICD-10-CM

## 2023-06-14 DIAGNOSIS — Z4689 Encounter for fitting and adjustment of other specified devices: Secondary | ICD-10-CM | POA: Diagnosis not present

## 2023-06-14 NOTE — Progress Notes (Signed)
  Subjective:     Patient ID: [name] [name], female   DOB: 1950/11/05, 73 y.o.   MRN: [id number]  HPI [name] is a 73 year old white female, widowed, PM in to have pessary replaced it fell out when had BM, had cramping/pressure before that.  PCP is Dr [name] [name]  Review of Systems Pessary feel out Reviewed past medical,surgical, social and family history. Reviewed medications and allergies.     Objective:   Physical Exam BP 122/79 (BP Location: Left Arm, Patient Position: Sitting, Cuff Size: Normal)   Pulse 80   Ht 5' (1.524 m)   Wt 150 lb (68 kg)   BMI 29.29 kg/m     Skin warm and dry.Pelvic: external genitalia is normal in appearance no lesions, vagina: +prolapse to introitus with cystocele and past if bears down, push back up and pessary, #5 milex ring with support easily inserted. Has stool felt in rectal vault. Examination chaperoned by [name] [name]   Upstream - 06/14/23 1206       Pregnancy Intention Screening   Does the patient want to become pregnant in the next year? No    Does the patient's partner want to become pregnant in the next year? No    Would the patient like to discuss contraceptive options today? No      Contraception Wrap Up   Current Method Female Sterilization   PM   End Method Female Sterilization   PM   Contraception Counseling Provided No             Assessment:     1. Pessary maintenance Pessary inserted   2. Cystocele with uterine prolapse She has appt 07/05/23 with Dr [name] [name]    Plan:     Follow up prn

## 2023-07-05 ENCOUNTER — Ambulatory Visit: Payer: Medicare PPO | Admitting: Obstetrics

## 2023-07-11 DIAGNOSIS — M47812 Spondylosis without myelopathy or radiculopathy, cervical region: Secondary | ICD-10-CM | POA: Diagnosis not present

## 2023-07-11 DIAGNOSIS — E6609 Other obesity due to excess calories: Secondary | ICD-10-CM | POA: Diagnosis not present

## 2023-07-11 DIAGNOSIS — G894 Chronic pain syndrome: Secondary | ICD-10-CM | POA: Diagnosis not present

## 2023-07-11 DIAGNOSIS — Z6832 Body mass index (BMI) 32.0-32.9, adult: Secondary | ICD-10-CM | POA: Diagnosis not present

## 2023-07-11 DIAGNOSIS — M1991 Primary osteoarthritis, unspecified site: Secondary | ICD-10-CM | POA: Diagnosis not present

## 2023-07-11 DIAGNOSIS — I10 Essential (primary) hypertension: Secondary | ICD-10-CM | POA: Diagnosis not present

## 2023-08-10 DIAGNOSIS — M5412 Radiculopathy, cervical region: Secondary | ICD-10-CM | POA: Diagnosis not present

## 2023-08-10 DIAGNOSIS — I10 Essential (primary) hypertension: Secondary | ICD-10-CM | POA: Diagnosis not present

## 2023-08-10 DIAGNOSIS — G43909 Migraine, unspecified, not intractable, without status migrainosus: Secondary | ICD-10-CM | POA: Diagnosis not present

## 2023-08-10 DIAGNOSIS — G894 Chronic pain syndrome: Secondary | ICD-10-CM | POA: Diagnosis not present

## 2023-09-05 DIAGNOSIS — M1991 Primary osteoarthritis, unspecified site: Secondary | ICD-10-CM | POA: Diagnosis not present

## 2023-09-05 DIAGNOSIS — M5412 Radiculopathy, cervical region: Secondary | ICD-10-CM | POA: Diagnosis not present

## 2023-09-05 DIAGNOSIS — N813 Complete uterovaginal prolapse: Secondary | ICD-10-CM | POA: Diagnosis not present

## 2023-09-05 DIAGNOSIS — G894 Chronic pain syndrome: Secondary | ICD-10-CM | POA: Diagnosis not present

## 2023-09-07 DIAGNOSIS — H5203 Hypermetropia, bilateral: Secondary | ICD-10-CM | POA: Diagnosis not present

## 2023-10-30 ENCOUNTER — Other Ambulatory Visit (HOSPITAL_COMMUNITY)
Admission: RE | Admit: 2023-10-30 | Discharge: 2023-10-30 | Disposition: A | Discharge status: Home / Self Care | Source: Ambulatory Visit | Attending: Obstetrics | Admitting: Obstetrics

## 2023-10-30 ENCOUNTER — Other Ambulatory Visit (HOSPITAL_COMMUNITY)
Admission: RE | Admit: 2023-10-30 | Discharge: 2023-10-30 | Disposition: A | Discharge status: Home / Self Care | Source: Other Acute Inpatient Hospital | Attending: Obstetrics | Admitting: Obstetrics

## 2023-10-30 ENCOUNTER — Ambulatory Visit (INDEPENDENT_AMBULATORY_CARE_PROVIDER_SITE_OTHER): Admitting: Obstetrics

## 2023-10-30 ENCOUNTER — Encounter: Payer: Self-pay | Admitting: Obstetrics

## 2023-10-30 ENCOUNTER — Other Ambulatory Visit: Payer: Self-pay

## 2023-10-30 VITALS — BP 102/65 | HR 93 | Ht <= 58 in | Wt 134.0 lb

## 2023-10-30 DIAGNOSIS — R82998 Other abnormal findings in urine: Secondary | ICD-10-CM | POA: Diagnosis not present

## 2023-10-30 DIAGNOSIS — N3946 Mixed incontinence: Secondary | ICD-10-CM | POA: Diagnosis not present

## 2023-10-30 DIAGNOSIS — R351 Nocturia: Secondary | ICD-10-CM | POA: Insufficient documentation

## 2023-10-30 DIAGNOSIS — K5900 Constipation, unspecified: Secondary | ICD-10-CM | POA: Insufficient documentation

## 2023-10-30 DIAGNOSIS — N814 Uterovaginal prolapse, unspecified: Secondary | ICD-10-CM | POA: Diagnosis not present

## 2023-10-30 DIAGNOSIS — R8290 Unspecified abnormal findings in urine: Secondary | ICD-10-CM | POA: Insufficient documentation

## 2023-10-30 DIAGNOSIS — N898 Other specified noninflammatory disorders of vagina: Secondary | ICD-10-CM | POA: Insufficient documentation

## 2023-10-30 DIAGNOSIS — Z4689 Encounter for fitting and adjustment of other specified devices: Secondary | ICD-10-CM | POA: Diagnosis not present

## 2023-10-30 DIAGNOSIS — R319 Hematuria, unspecified: Secondary | ICD-10-CM

## 2023-10-30 LAB — POCT URINALYSIS DIP (CLINITEK)
Bilirubin, UA: NEGATIVE
Bilirubin, UA: NEGATIVE
Blood, UA: NEGATIVE
Glucose, UA: NEGATIVE mg/dL
Glucose, UA: NEGATIVE mg/dL
Ketones, POC UA: NEGATIVE mg/dL
Ketones, POC UA: NEGATIVE mg/dL
Nitrite, UA: POSITIVE — AB
Nitrite, UA: POSITIVE — AB
POC PROTEIN,UA: NEGATIVE
POC PROTEIN,UA: NEGATIVE
Spec Grav, UA: 1.015 (ref 1.010–1.025)
Spec Grav, UA: 1.02 (ref 1.010–1.025)
Urobilinogen, UA: 0.2 U/dL
Urobilinogen, UA: 0.2 U/dL
pH, UA: 6 (ref 5.0–8.0)
pH, UA: 7 (ref 5.0–8.0)

## 2023-10-30 MED ORDER — ESTRADIOL 0.1 MG/GM VA CREA: TOPICAL_CREAM | VAGINAL | 3 refills | Status: DC

## 2023-10-30 NOTE — Patient Instructions (Addendum)
 You have a stage 3 (out of 4) prolapse.  We discussed the fact that it is not life threatening but there are several treatment options. For treatment of pelvic organ prolapse, we discussed options for management including expectant management, conservative management, and surgical management, such as Kegels, a pessary, pelvic floor physical therapy, and specific surgical procedures.     For vaginal atrophy (thinning of the vaginal tissue that can cause dryness and burning) and UTI prevention we discussed estrogen replacement in the form of vaginal cream.   Start vaginal estrogen therapy nightly for two weeks then 2 times weekly at night. This can be placed with your finger or an applicator inside the vagina and around the urethra.  Please let us  know if the prescription is too expensive and we can look for alternative options.   Is vaginal estrogen therapy safe for me? Vaginal estrogen preparations act on the vaginal skin, and only a very tiny amount is absorbed into the bloodstream (0.01%).  They work in a similar way to hand or face cream.  There is minimal absorption and they are therefore perfectly safe. If you have had breast cancer and have persistent troublesome symptoms which aren't settling with vaginal moisturisers and lubricants, local estrogen treatment may be a possibility, but consultation with your oncologist should take place first.   Constipation: Our goal is to achieve formed bowel movements daily or every-other-day.  You may need to try different combinations of the following options to find what works best for you - everybody's body works differently so feel free to adjust the dosages as needed.  Some options to help maintain bowel health include:  Dietary changes (more leafy greens, vegetables and fruits; less processed foods) Fiber supplementation (Benefiber, FiberCon, Metamucil or Psyllium). Start slow and increase gradually to full dose. Over-the-counter agents such as: stool  softeners (Docusate or Colace) and/or laxatives (Miralax, milk of magnesia)  Power Pudding is a natural mixture that may help your constipation.  To make blend 1 cup applesauce, 1 cup wheat bran, and 3/4 cup prune juice, refrigerate and then take 1 tablespoon daily with a large glass of water as needed.   Women should try to eat at least 21 to 25 grams of fiber a day, while men should aim for 30 to 38 grams a day. You can add fiber to your diet with food or a fiber supplement such as psyllium (metamucil), benefiber, or fibercon.   Here's a look at how much dietary fiber is found in some common foods. When buying packaged foods, check the Nutrition Facts label for fiber content. It can vary among brands.  Fruits Serving size Total fiber (grams)*  Raspberries 1 cup 8.0  Pear 1 medium 5.5  Apple, with skin 1 medium 4.5  Banana 1 medium 3.0  Orange 1 medium 3.0  Strawberries 1 cup 3.0   Vegetables Serving size Total fiber (grams)*  Green peas, boiled 1 cup 9.0  Broccoli, boiled 1 cup chopped 5.0  Turnip greens, boiled 1 cup 5.0  Brussels sprouts, boiled 1 cup 4.0  Potato, with skin, baked 1 medium 4.0  Sweet corn, boiled 1 cup 3.5  Cauliflower, raw 1 cup chopped 2.0  Carrot, raw 1 medium 1.5   Grains Serving size Total fiber (grams)*  Spaghetti, whole-wheat, cooked 1 cup 6.0  Barley, pearled, cooked 1 cup 6.0  Bran flakes 3/4 cup 5.5  Quinoa, cooked 1 cup 5.0  Oat bran muffin 1 medium 5.0  Oatmeal, instant, cooked 1 cup  5.0  Popcorn, air-popped 3 cups 3.5  Brown rice, cooked 1 cup 3.5  Bread, whole-wheat 1 slice 2.0  Bread, rye 1 slice 2.0   Legumes, nuts and seeds Serving size Total fiber (grams)*  Split peas, boiled 1 cup 16.0  Lentils, boiled 1 cup 15.5  Black beans, boiled 1 cup 15.0  Baked beans, canned 1 cup 10.0  Chia seeds 1 ounce 10.0  Almonds 1 ounce (23 nuts) 3.5  Pistachios 1 ounce (49 nuts) 3.0  Sunflower kernels 1 ounce 3.0  *Rounded to nearest 0.5  gram. Source: [employer] for [employer], Legacy Release    For night time frequency: - avoid fluid intake 3 hours before bedtime - elevated your feet during the day or use compression socks to reduce lower extremity swelling - if you experience snoring/, consider sleep study for sleep apnea  We discussed the symptoms of overactive bladder (OAB), which include urinary urgency, urinary frequency, night-time urination, with or without urge incontinence.  We discussed management including behavioral therapy (decreasing bladder irritants by following a bladder diet, urge suppression strategies, timed voids, bladder retraining), physical therapy, medication; and for refractory cases posterior tibial nerve stimulation, sacral neuromodulation, and intravesical botulinum toxin injection.

## 2023-10-30 NOTE — Assessment & Plan Note (Addendum)
-   POCT UA + leuk/protein, pending catheterized UA micro and culture. PVR 26mL - ambulates with walker, possible association with functional incontinence and consider bedside commode  - We discussed the symptoms of overactive bladder (OAB), which include urinary urgency, urinary frequency, nocturia, with or without urge incontinence.  While we do not know the exact etiology of OAB, several treatment options exist. We discussed management including behavioral therapy (decreasing bladder irritants, urge suppression strategies, timed voids, bladder retraining), physical therapy, medication; for refractory cases posterior tibial nerve stimulation, sacral neuromodulation, and intravesical botulinum toxin injection.  For anticholinergic medications, we discussed the potential side effects of anticholinergics including dry eyes, dry mouth, constipation, cognitive impairment and urinary retention. For Beta-3 agonist medication, we discussed the potential side effect of elevated blood pressure which is more likely to occur in individuals with uncontrolled hypertension. - encouraged fluid management and reduction of soda intake - reassess symptoms after #6 ring with support pessary use - declines medications at this time, consider OAB medication if refractory after fluid mgmt and pessary use - reassess after low dose vaginal estrogen - avoid anti-cholinergic use due to h/o glaucoma

## 2023-10-30 NOTE — Assessment & Plan Note (Addendum)
-   avoid fluid intake 3 hours before bedtime - urgency at night time  - elevated feet during the day or use compression socks to reduce lower extremity swelling - switch diuretic (e.g. furosemide) dosing to 2pm - due to snoring and history of sleep apnea, encouraged follow-up with pulm regarding CPAP use - referral to pulm near [place] per request

## 2023-10-30 NOTE — Assessment & Plan Note (Addendum)
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  We also discussed the importance of avoiding chronic straining, as it can exacerbate her pelvic floor symptoms; we discussed treating constipation and straining prior to surgery, as postoperative straining can lead to damage to the repair and recurrence of symptoms. We discussed initiating therapy with increasing fluid intake, fiber supplementation, stool softeners, and laxatives such as miralax.  - encouraged titration of fiber supplementation or miralax to optimize stool consistency - encourage squatting position for defecation to minimize straining and avoid pessary expulsion and discussed association with pelvic floor disorders - encouraged to follow-up with Dr. Wilhelmenia (GI) if she experiences additional blood when she wipes after bowel movements - referral to GI closer to  per pt's daughter's request for ease of follow-up

## 2023-10-30 NOTE — Assessment & Plan Note (Addendum)
-   size 5 ring with support pessary managed in the office, reports discomfort with bending over and pinching sensation in addition to vaginal bulge past vaginal opening with pessary in place - For treatment of pelvic organ prolapse, we discussed options for management including expectant management, conservative management, and surgical management, such as Kegels, a pessary, pelvic floor physical therapy, and specific surgical procedures. - trial of size 6 ring with support pessary, denies pinching or discomfort with bending over and straining on commode - start low dose vaginal estrogen due to vaginal atrophy on exam to reduce discomfort with pessary use - trial of size 6 ring with support pessary, return in 2-4 weeks for pessary check to reassess symptoms - explained need to optimize stool consistency to reduce straining during BM

## 2023-10-30 NOTE — Assessment & Plan Note (Signed)
-   pending Nuswab to r/o infectious etiology - likely due to atrophy - For symptomatic vaginal atrophy options include lubrication with a water-based lubricant, personal hygiene measures and barrier protection against wetness, and estrogen replacement in the form of vaginal cream, vaginal tablets, or a time-released vaginal ring.   - Rx to start low dose vaginal estrogen - consider Estring  if pt desires to continue pessary use

## 2023-10-30 NOTE — Progress Notes (Unsigned)
 New Patient Evaluation and Consultation  Referring Provider: Jayne Vonn DEL, MD PCP: Bertell Satterfield, MD Date of Service: 10/30/2023  SUBJECTIVE Chief Complaint: New Patient (Initial Visit) [name] [name] is a 73 y.o. female here today for female organ prolapse. )  History of Present Illness: [name] [name] is a 73 y.o. White or Caucasian female seen in consultation at the request of Dr Jayne for evaluation of pelvic organ prolapse and urinary incontinence.    Presents with her daughter  [place] size vaginal bulge for 2-3 years, reduced to 1/2 size with pessary in place Pushes bulge inside the vagina to void Pessary managed by the office every 3 months Denies vaginal bleeding or discharge, uses Monistat with dysuria.  Reports small amount of blood when she wipes after bowel movements. Patient points to LLQ abdominal bulge when referring to pelvic pain  Desires surgical intervention for pain relief Dislikes #5 ring with support pessary due to discomfort, expels during hard bowel movement and when she walking/bends over. Started pessary use in 2023.  Reports that her pessary stops her from having bowel movements Denies issues with bowel movements prior to pessary use Uses opioids for fibromyalgia and RA Desires to establish care near [facility] for GI and pulm.  Uses walker to ambulate  Review of records significant for: Chronic pain and fibromyalgia with Robaxin , neurontin PRN, and prior oxycodone  20mg  use and history of cervical radiculopathy s/p cervical laminectomy, history of RA, aortic aneurysm. H/o RLE DVT after COVID infection complicated by acute respiratory failure and hypoxia in 2022 History of glaucoma Admission in 2012 due to sepsis secondary to RLE cellulitis after cat scratch History of migraine  Urinary Symptoms: Leaks urine with cough/ sneeze, with movement to the bathroom, and with urgency Leaks 4 time(s) per week mostly at night with urgency Denies daytime  leakage Pad use: 2 adult diapers per day.   Patient is bothered by UI symptoms.  Day time voids 4.  Nocturia: 3 times per night to void. Intermittent snoring with h/o sleep apnea, used O2 via [place] Denies leg swelling Drinks until bedtime Uses lasix intermittently only  Voiding dysfunction:  does not empty bladder well.  Patient does not use a catheter to empty bladder.  When urinating, patient feels a weak stream and dribbling after finishing Drinks: 32oz water per day, 32-48oz cherry sundrop zero, intermittent coffee  UTIs: 2 UTI's in the last year with dysuria. Denies history of blood in urine, kidney or bladder stones, pyelonephritis, bladder cancer, and kidney cancer No results found for the last 90 days.   Pelvic Organ Prolapse Symptoms:                  Patient Admits to a feeling of a bulge the vaginal area. It has been present for 3 years.  Patient Denies seeing a bulge with pessary place This bulge is bothersome.  Bowel Symptom: Bowel movements: 2-3 time(s) per week, previously 3-4x/week Stool consistency: hard, type II stool  Straining: yes.  Splinting: yes.  Incomplete evacuation: yes.  Patient Denies accidental bowel leakage / fecal incontinence Bowel regimen: fiber, stool softener, and miralax Takes OTC senna with laxative 2 tabs/day, dulcolax PO 3-4x/week Manually extract stool 1-2x/week started 3-4 months ago Last colonoscopy: unavailable for review, [facility] 10/26/22 managed by Dr. Mansouraty [facility] Colon[facility]          Current Care Gaps     Colonoscopy (Every 10 Years) Never done   No completion history exists for this topic.  Sexual Function Sexually active: no.  Sexual orientation: Straight Pain with sex: No  Pelvic Pain Admits to pelvic pain Location: suprapubic, intermittently worsens to 4/10  Pain occurs: prior to bowel movement Prior pain treatment: Ibuprofen  and oxycodone  for fibromyalgia Improved by: having a bowel  movement Worsened by: need to defecate    Past Medical History:  Past Medical History:  Diagnosis Date   Anxiety    Asthma    Ectopic pregnancy    Fibromyalgia    Glaucoma    Hypertension    OSA (obstructive sleep apnea)    o2 at 2.5 liters at night   Rheumatoid arthritis(714.0)      Past Surgical History:   Past Surgical History:  Procedure Laterality Date   BACK SURGERY     BALLOON DILATION N/A 04/07/2022   Procedure: BALLOON DILATION;  Surgeon: Wilhelmenia Aloha Raddle., MD;  Location: [facility];  Service: Gastroenterology;  Laterality: N/A;   BILIARY DILATION  01/30/2022   Procedure: BILIARY DILATION;  Surgeon: Wilhelmenia Aloha Raddle., MD;  Location: [facility] ENDOSCOPY;  Service: Gastroenterology;;   BILIARY STENT PLACEMENT  01/30/2022   Procedure: BILIARY STENT PLACEMENT;  Surgeon: Wilhelmenia Aloha Raddle., MD;  Location: [facility] ENDOSCOPY;  Service: Gastroenterology;;   BIOPSY  01/30/2022   Procedure: BIOPSY;  Surgeon: Wilhelmenia Aloha Raddle., MD;  Location: [facility] ENDOSCOPY;  Service: Gastroenterology;;   CATARACT EXTRACTION     CATARACT EXTRACTION W/PHACO  01/16/2012   Procedure: CATARACT EXTRACTION PHACO AND INTRAOCULAR LENS PLACEMENT (IOC);  Surgeon: Dow JULIANNA Burke, MD;  Location: [facility];  Service: Ophthalmology;  Laterality: Right;  CDE: 14.05   CERVICAL LAMINECTOMY     with plating   CHOLECYSTECTOMY     ECTOPIC PREGNANCY SURGERY     ENDOSCOPIC RETROGRADE CHOLANGIOPANCREATOGRAPHY (ERCP) WITH PROPOFOL  N/A 04/07/2022   Procedure: ENDOSCOPIC RETROGRADE CHOLANGIOPANCREATOGRAPHY (ERCP) WITH PROPOFOL ;  Surgeon: Wilhelmenia Aloha Raddle., MD;  Location: [facility];  Service: Gastroenterology;  Laterality: N/A;   ERCP N/A 01/30/2022   Procedure: ENDOSCOPIC RETROGRADE CHOLANGIOPANCREATOGRAPHY (ERCP);  Surgeon: Wilhelmenia Aloha Raddle., MD;  Location: [facility]COPY;  Service: Gastroenterology;  Laterality: N/A;   KNEE ARTHROSCOPY     REMOVAL OF STONES  01/30/2022   Procedure: REMOVAL OF  STONES;  Surgeon: Wilhelmenia Aloha Raddle., MD;  Location: [facility] ENDOSCOPY;  Service: Gastroenterology;;   REMOVAL OF STONES  04/07/2022   Procedure: REMOVAL OF STONES;  Surgeon: Wilhelmenia Aloha Raddle., MD;  Location: [facility];  Service: Gastroenterology;;   ANNETT  01/30/2022   Procedure: ANNETT;  Surgeon: Mansouraty, Aloha Raddle., MD;  Location: [facility] ENDOSCOPY;  Service: Gastroenterology;;   CLEDA REMOVAL  04/07/2022   Procedure: STENT REMOVAL;  Surgeon: Wilhelmenia Aloha Raddle., MD;  Location: [facility];  Service: Gastroenterology;;   STONE EXTRACTION WITH BASKET  01/30/2022   Procedure: STONE EXTRACTION WITH BASKET;  Surgeon: Wilhelmenia Aloha Raddle., MD;  Location: [facility] ENDOSCOPY;  Service: Gastroenterology;;   TUBAL LIGATION     vericose vein surgery       Past OB/GYN History: OB History  Gravida Para Term Preterm AB Living  3 2 2  1 2   SAB IAB Ectopic Multiple Live Births  0  1  2    # Outcome Date GA Lbr Len/2nd Weight Sex Type Anes PTL Lv  3 Term     M Vag-Spont   LIV  2 Term     F Vag-Spont   LIV  1 Ectopic     M    LIV    Vaginal deliveries: largest infant  10lbs, 22inches Forceps/ Vacuum deliveries: 0, Cesarean section: 0. Menopausal: Yes, at age 65, Denies vaginal bleeding since menopause Contraception: tubal ligation. Last pap smear was unknown timing.  Any history of abnormal pap smears: no. No results found for: DIAGPAP, HPVHIGH, ADEQPAP  Medications: Patient has a current medication list which includes the following prescription(s): aspirin-acetaminophen -caffeine , butalbital -acetaminophen -caffeine , diltiazem , doxycycline, [START ON 11/02/2023] estradiol , fexofenadine, folic acid , furosemide, gabapentin, methocarbamol , opcon-a , omeprazole, ondansetron , oxycodone  hcl, and oxygen-helium.   Allergies: Patient is allergic to dolobid [diflunisal], hydrocodone-ibuprofen , biaxin [clarithromycin], lyrica [pregabalin], oxycontin  [oxycodone ], singulair  [montelukast], and sulfamethoxazole -trimethoprim .   Social History:  Social History   Tobacco Use   Smoking status: Never   Smokeless tobacco: Never  Vaping Use   Vaping status: Never Used  Substance Use Topics   Alcohol  use: No   Drug use: No    Relationship status: widowed Patient lives with her daughter.   Patient is not employed. Regular exercise: No History of abuse: No  Family History:   Family History  Problem Relation Age of Onset   Arthritis Mother    Osteoporosis Mother    Fibromyalgia Mother    Parkinson's disease Father    Parkinson's disease Brother    Stroke Maternal Grandmother    Colon cancer Neg Hx    Esophageal cancer Neg Hx    Inflammatory bowel disease Neg Hx    Liver disease Neg Hx    Pancreatic cancer Neg Hx    Rectal cancer Neg Hx    Stomach cancer Neg Hx    Uterine cancer Neg Hx    Bladder Cancer Neg Hx    Renal cancer Neg Hx      Review of Systems: Review of Systems  Constitutional:  Negative for fever, malaise/fatigue and weight loss.  Respiratory:  Negative for cough, shortness of breath and wheezing.   Cardiovascular:  Negative for chest pain, palpitations and leg swelling.  Gastrointestinal:  Positive for abdominal pain and constipation. Negative for blood in stool.  Genitourinary:  Positive for dysuria, frequency (night time) and urgency. Negative for hematuria.  Skin:  Negative for rash.  Neurological:  Positive for dizziness, weakness and headaches.  Endo/Heme/Allergies:  Does not bruise/bleed easily.  Psychiatric/Behavioral:  Negative for depression. The patient is nervous/anxious.      OBJECTIVE Physical Exam: Vitals:   10/30/23 1105  BP: 102/65  Pulse: 93  Weight: 134 lb (60.8 kg)  Height: 4' 10 (1.473 m)    Physical Exam Constitutional:      General: She is not in acute distress.    Appearance: Normal appearance.  Genitourinary:     Bladder and urethral meatus normal.     No lesions in the vagina.      Genitourinary Comments: #5 ring with support pessary removed without difficulty     Right Labia: No rash, tenderness, lesions, skin changes or Bartholin's cyst.    Left Labia: No tenderness, lesions, skin changes, Bartholin's cyst or rash.    No vaginal discharge, erythema, tenderness, bleeding, ulceration or granulation tissue.     Anterior and apical vaginal prolapse present.    Severe vaginal atrophy present.     Right Adnexa: not tender, not full and no mass present.    Left Adnexa: not tender, not full and no mass present.    No cervical motion tenderness, discharge, friability, lesion, polyp or nabothian cyst.     Uterus is prolapsed.     Uterus is not enlarged, fixed, tender or irregular.     No  uterine mass detected.    Urethral meatus caruncle not present.    No urethral prolapse, tenderness, mass, hypermobility, discharge or stress urinary incontinence with cough stress test present.     Bladder is not tender, urgency on palpation not present and masses not present.      Pelvic Floor: Levator muscle strength is 2/5.    Levator ani not tender, obturator internus not tender, no asymmetrical contractions present and no pelvic spasms present.    Symmetrical pelvic sensation, anal wink present and BC reflex present. Cardiovascular:     Rate and Rhythm: Normal rate.  Pulmonary:     Effort: Pulmonary effort is normal. No respiratory distress.  Abdominal:     General: There is no distension.     Palpations: Abdomen is soft. There is no mass.     Tenderness: There is no abdominal tenderness.     Hernia: A hernia is present.   Neurological:     Mental Status: She is alert.  Vitals reviewed. Exam conducted with a chaperone present.     POP-Q:   POP-Q  3                                            Aa   3                                           Ba  0                                              C   3                                            Gh  4                                             Pb  7                                            tvl   -2                                            Ap  -2                                            Bp  -1  D    Post-Void Residual (PVR) by Bladder Scan: In order to evaluate bladder emptying, we discussed obtaining a postvoid residual and patient agreed to this procedure.  Procedure: The ultrasound unit was placed on the patient's abdomen in the suprapubic region after the patient had voided.    Post Void Residual - 10/30/23 1121       Post Void Residual   Post Void Residual 26 mL         Straight Catheterization Procedure for PVR: After verbal consent was obtained from the patient for catheterization to assess bladder emptying and residual volume the urethra and surrounding tissues were prepped with betadine and an in and out catheterization was performed.  PVR was .  Urine appeared clear yellow. The patient tolerated the procedure well.  A size 6 ring with support pessary was placed. It was comfortable, fit well, and stayed in placed with strong cough, valsalva and bending. The patient sat on the commode and reports comfort. Lot # *** Exp ***   Laboratory Results: Lab Results  Component Value Date   COLORU yellow 10/30/2023   CLARITYU cloudy (A) 10/30/2023   GLUCOSEUR negative 10/30/2023   BILIRUBINUR negative 10/30/2023   KETONESU Negative 06/04/2021   SPECGRAV 1.020 10/30/2023   RBCUR trace-intact (A) 10/30/2023   PHUR 7.0 10/30/2023   PROTEINUR 30 (A) 01/29/2022   UROBILINOGEN 0.2 10/30/2023   LEUKOCYTESUR Moderate (2+) (A) 10/30/2023    Lab Results  Component Value Date   CREATININE 0.69 03/30/2022   CREATININE 0.50 01/31/2022   CREATININE <0.30 (L) 01/30/2022    No results found for: HGBA1C  Lab Results  Component Value Date   HGB 12.9 03/30/2022     ASSESSMENT AND PLAN Ms. Marschke is a 73 y.o. with:  1. Cystocele with  uterine prolapse   2. Nocturia   3. Urinary incontinence, mixed   4. Constipation, unspecified constipation type   5. Encounter for fitting and adjustment of pessary   6. Vaginal irritation   7. Abnormal urinalysis     Cystocele with uterine prolapse Assessment & Plan: - size 5 ring with support pessary managed in the office, reports discomfort with bending over and pinching sensation in addition to vaginal bulge past vaginal opening with pessary in place - For treatment of pelvic organ prolapse, we discussed options for management including expectant management, conservative management, and surgical management, such as Kegels, a pessary, pelvic floor physical therapy, and specific surgical procedures. - trial of size 6 ring with support pessary, denies pinching or discomfort with bending over and straining on commode - start low dose vaginal estrogen due to vaginal atrophy on exam to reduce discomfort with pessary use - trial of size 6 ring with support pessary, return in 2-4 weeks for pessary check to reassess symptoms - explained need to optimize stool consistency to reduce straining during BM  Orders: -     Estradiol ; Place 0.5g nightly for two weeks then twice a week after  Dispense: 30 g; Refill: 3  Nocturia Assessment & Plan: - avoid fluid intake 3 hours before bedtime - urgency at night time  - elevated feet during the day or use compression socks to reduce lower extremity swelling - switch diuretic (e.g. furosemide) dosing to 2pm - due to snoring and history of sleep apnea, encouraged follow-up with pulm regarding CPAP use - referral to pulm near [place] per request   Orders: -     POCT URINALYSIS DIP (CLINITEK) -     Pulmonary  Visit  Urinary incontinence, mixed Assessment & Plan:  - POCT UA + leuk/protein, pending catheterized UA micro and culture. PVR 26mL - ambulates with walker, possible association with functional incontinence and consider bedside commode  -  We discussed the symptoms of overactive bladder (OAB), which include urinary urgency, urinary frequency, nocturia, with or without urge incontinence.  While we do not know the exact etiology of OAB, several treatment options exist. We discussed management including behavioral therapy (decreasing bladder irritants, urge suppression strategies, timed voids, bladder retraining), physical therapy, medication; for refractory cases posterior tibial nerve stimulation, sacral neuromodulation, and intravesical botulinum toxin injection.  For anticholinergic medications, we discussed the potential side effects of anticholinergics including dry eyes, dry mouth, constipation, cognitive impairment and urinary retention. For Beta-3 agonist medication, we discussed the potential side effect of elevated blood pressure which is more likely to occur in individuals with uncontrolled hypertension. - encouraged fluid management and reduction of soda intake - reassess symptoms after #6 ring with support pessary use - declines medications at this time, consider OAB medication if refractory after fluid mgmt and pessary use - reassess after low dose vaginal estrogen - avoid anti-cholinergic use due to h/o glaucoma  Orders: -     POCT URINALYSIS DIP (CLINITEK) -     POCT URINALYSIS DIP (CLINITEK)  Constipation, unspecified constipation type Assessment & Plan: - BM 2-3x/week with Type II stool and straining since pessary use - tried OTC senna daily and dulcolax PO 3-4x/wk - prior BM 3-4x/week - LLQ and suprapubic pain resolves with defecation, reducible incisional hernia left of midline incision from ectopic pregnancy - For constipation, we reviewed the importance of a better bowel regimen.  We also discussed the importance of avoiding chronic straining, as it can exacerbate her pelvic floor symptoms; we discussed treating constipation and straining prior to surgery, as postoperative straining can lead to damage to the  repair and recurrence of symptoms. We discussed initiating therapy with increasing fluid intake, fiber supplementation, stool softeners, and laxatives such as miralax.  - encouraged titration of fiber supplementation or miralax to optimize stool consistency - encourage squatting position for defecation to minimize straining and avoid pessary expulsion and discussed association with pelvic floor disorders - encouraged to follow-up with Dr. Wilhelmenia (GI) if she experiences additional blood when she wipes after bowel movements - referral to GI closer to  per pt's daughter's request for ease of follow-up   Orders: -     Ambulatory referral to Gastroenterology  Encounter for fitting and adjustment of pessary Assessment & Plan: - discomfort and expulsion with size 5 ring with support pessary in the setting of constipation - trial of size 6 ring with support pessary  - discussed risk of change in urinary or bowel symptoms, vaginal ulceration, discharge, bleeding, fistula formation. Explained that pt may require multiple sizes and types for fitting.  - start low dose vaginal estrogen to reduce discomfort with pessary management   Vaginal irritation Assessment & Plan: - pending Nuswab to r/o infectious etiology - likely due to atrophy - For symptomatic vaginal atrophy options include lubrication with a water-based lubricant, personal hygiene measures and barrier protection against wetness, and estrogen replacement in the form of vaginal cream, vaginal tablets, or a time-released vaginal ring.   - Rx to start low dose vaginal estrogen - consider Estring  if pt desires to continue pessary use  Orders: -     Cervicovaginal ancillary only  Abnormal urinalysis Assessment & Plan: - POCT clean catch UA +  leuk/nit - pending catheterized UA and culture - intermittent dysuria, resolved with antibiotics - For management of microscopic hematuria, we discussed the importance of work-up  including assessing the upper and lower GU tract with CT urogram and cystoscopy. She will pursue this work-up and follow-up afterward to discuss the results and decide on a treatment plan based on the findings.  - last Cr 0.69 in 03/30/22   Orders: -     Urine Culture; Future -     Urine Microscopic; Future  Time spent: I spent 74 minutes dedicated to the care of this patient on the date of this encounter to include pre-visit review of records, face-to-face time with the patient discussing stage III pelvic organ prolapse, pessary refitting, constipation, mixed urinary incontinence, nocturia, abnormal urinalysis, vaginal irritation, and post visit documentation and ordering medication/ testing.    Lianne ONEIDA Gillis, MD

## 2023-10-30 NOTE — Assessment & Plan Note (Signed)
-   discomfort and expulsion with size 5 ring with support pessary in the setting of constipation - trial of size 6 ring with support pessary  - discussed risk of change in urinary or bowel symptoms, vaginal ulceration, discharge, bleeding, fistula formation. Explained that pt may require multiple sizes and types for fitting.  - start low dose vaginal estrogen to reduce discomfort with pessary management

## 2023-10-30 NOTE — Assessment & Plan Note (Addendum)
-   POCT clean catch UA + leuk/nit - pending catheterized UA and culture - intermittent dysuria, resolved with antibiotics - For management of microscopic hematuria, we discussed the importance of work-up including assessing the upper and lower GU tract with CT urogram and cystoscopy. She will pursue this work-up and follow-up afterward to discuss the results and decide on a treatment plan based on the findings.  - last Cr 0.69 in 03/30/22

## 2023-10-31 ENCOUNTER — Ambulatory Visit: Payer: Self-pay | Admitting: Obstetrics

## 2023-10-31 DIAGNOSIS — N898 Other specified noninflammatory disorders of vagina: Secondary | ICD-10-CM

## 2023-10-31 DIAGNOSIS — Z4689 Encounter for fitting and adjustment of other specified devices: Secondary | ICD-10-CM

## 2023-10-31 DIAGNOSIS — R8290 Unspecified abnormal findings in urine: Secondary | ICD-10-CM

## 2023-10-31 LAB — CERVICOVAGINAL ANCILLARY ONLY
Bacterial Vaginitis (gardnerella): POSITIVE — AB
Candida Glabrata: NEGATIVE
Candida Vaginitis: NEGATIVE
Comment: NEGATIVE
Comment: NEGATIVE
Comment: NEGATIVE

## 2023-10-31 LAB — URINALYSIS, COMPLETE (UACMP) WITH MICROSCOPIC
Bilirubin Urine: NEGATIVE
Glucose, UA: NEGATIVE mg/dL
Hgb urine dipstick: NEGATIVE
Ketones, ur: NEGATIVE mg/dL
Nitrite: NEGATIVE
Protein, ur: NEGATIVE mg/dL
Specific Gravity, Urine: 1.014 (ref 1.005–1.030)
WBC, UA: >50 WBC/hpf (ref 0–5)
pH: 7 (ref 5.0–8.0)

## 2023-10-31 MED ORDER — METRONIDAZOLE 500 MG PO TABS: 500.0000 mg | ORAL_TABLET | Freq: Two times a day (BID) | ORAL | 0 refills | Status: AC

## 2023-11-02 LAB — URINE CULTURE: Culture: >=100000 — AB

## 2023-11-02 MED ORDER — NITROFURANTOIN MONOHYD MACRO 100 MG PO CAPS: 100.0000 mg | ORAL_CAPSULE | Freq: Two times a day (BID) | ORAL | 0 refills | Status: AC

## 2023-11-24 ENCOUNTER — Encounter: Payer: Self-pay | Admitting: Obstetrics

## 2023-12-15 ENCOUNTER — Ambulatory Visit: Admitting: Obstetrics and Gynecology

## 2023-12-18 ENCOUNTER — Ambulatory Visit: Admitting: Obstetrics and Gynecology

## 2023-12-26 ENCOUNTER — Telehealth: Payer: Self-pay | Admitting: Obstetrics and Gynecology

## 2023-12-26 ENCOUNTER — Encounter: Payer: Self-pay | Admitting: Obstetrics and Gynecology

## 2023-12-26 ENCOUNTER — Ambulatory Visit (INDEPENDENT_AMBULATORY_CARE_PROVIDER_SITE_OTHER): Admitting: Obstetrics and Gynecology

## 2023-12-26 VITALS — BP 123/79 | HR 76

## 2023-12-26 DIAGNOSIS — N813 Complete uterovaginal prolapse: Secondary | ICD-10-CM | POA: Diagnosis not present

## 2023-12-26 DIAGNOSIS — Z466 Encounter for fitting and adjustment of urinary device: Secondary | ICD-10-CM | POA: Diagnosis not present

## 2023-12-26 DIAGNOSIS — N814 Uterovaginal prolapse, unspecified: Secondary | ICD-10-CM

## 2023-12-26 DIAGNOSIS — N952 Postmenopausal atrophic vaginitis: Secondary | ICD-10-CM

## 2023-12-26 MED ORDER — ESTRADIOL 0.01 % VA CREA: 0.5000 g | TOPICAL_CREAM | VAGINAL | 11 refills | Status: DC

## 2023-12-26 NOTE — Progress Notes (Signed)
 [facility]   Subjective:     Chief Complaint: Pessary Check [name] [name] is a 73 y.o. female is here for pessary check.)  History of Present Illness: [name] [name] is a 73 y.o. female with stage III pelvic organ prolapse who presents today for a pessary fitting. Was using #6 RWS that was uncomfortable and she could not poop with the pessary in.    Past Medical History: Patient  has a past medical history of Anxiety, Asthma, Ectopic pregnancy, Fibromyalgia, Glaucoma, Hypertension, OSA (obstructive sleep apnea), and Rheumatoid arthritis(714.0).   Past Surgical History: She  has a past surgical history that includes Back surgery; Tubal ligation; Ectopic pregnancy surgery; vericose vein surgery; Knee arthroscopy; Cataract extraction; Cholecystectomy; Cervical laminectomy; Cataract extraction w/PHACO (01/16/2012); ERCP (N/A, 01/30/2022); Sphincterotomy (01/30/2022); removal of stones (01/30/2022); biliary stent placement (01/30/2022); Biliary dilation (01/30/2022); Stone extraction with basket (01/30/2022); biopsy (01/30/2022); Endoscopic retrograde cholangiopancreatography (ercp) with propofol  (N/A, 04/07/2022); removal of stones (04/07/2022); Balloon dilation (N/A, 04/07/2022); and Stent removal (04/07/2022).   Medications: She has a current medication list which includes the following prescription(s): aspirin-acetaminophen -caffeine , butalbital -acetaminophen -caffeine , diltiazem , doxycycline, [START ON 12/28/2023] estradiol , estradiol , fexofenadine, folic acid , furosemide, gabapentin, methocarbamol , opcon-a , omeprazole, ondansetron , oxygen-helium, and oxycodone  hcl.   Allergies: Patient is allergic to dolobid [diflunisal], hydrocodone-ibuprofen , biaxin [clarithromycin], lyrica [pregabalin], oxycontin  [oxycodone ], singulair [montelukast], and sulfamethoxazole -trimethoprim .   Social History: Patient  reports that she has never smoked. She has never used smokeless tobacco. She reports  that she does not drink alcohol  and does not use drugs.      Objective:    BP 123/79   Pulse 76  Gen: No apparent distress, A&O x 3. Pelvic Exam: Normal external female genitalia; Bartholin's and Skene's glands normal in appearance; urethral meatus normal in appearance, no urethral masses or discharge.   A size #4 short stem gellhorn pessary (Lot [id number]) was fitted. It was comfortable, stayed in place with valsalva and was an appropriate size on examination, with one finger fitting between the pessary and the vaginal walls. We tied a string to it and the patient demonstrated proper removal and replacement.    Assessment/Plan:    Assessment: Ms. Ackroyd is a 73 y.o. with stage III pelvic organ prolapse who presents for a pessary fitting. Plan: She was fitted with a #4 short stem gellhorn pessary. She will keep the pessary in place until next visit. She will use estrogen.   [facility]  Follow-up in 6 weeks for a pessary check or sooner as needed.  All questions were answered.    Asaf Elmquist G Romari Gasparro, NP

## 2023-12-26 NOTE — Patient Instructions (Addendum)
 Please take Miralax for your stools.   Please call if this pessary is uncomfortable or you notice bleeding  Please use estrogen cream nightly for the next week then drop down to twice a week.   We sent your prescription to the [facility]. They will send your daughter an email to sign up and it should cost you less than 20$ for the cream

## 2023-12-26 NOTE — Telephone Encounter (Signed)
 Pt said she went to the bathroom and the pessary came right out.  She said she thought it felt pretty good, but it came out when she went to the bathroom.  She said she is not sure what she needs to do now.

## 2023-12-27 NOTE — Telephone Encounter (Signed)
 Called patient and told her that we need to order the #5 short stem gelhorn and will call her to get scheduled when we receive it.

## 2023-12-29 ENCOUNTER — Telehealth: Payer: Self-pay

## 2023-12-29 NOTE — Telephone Encounter (Signed)
 Zuleta, Kaitlin G, NP  Phillip, Latisia N, CMA; Krystal Andree GAILS, CMA I sent her a new script [facility] estrogen to [facility] because she was paying over 50$ for it at pharmacy. Duplicate Rx removed from med list.

## 2024-01-09 MED ORDER — ESTRADIOL 0.01 % VA CREA: 0.5000 g | TOPICAL_CREAM | VAGINAL | 11 refills | Status: DC

## 2024-01-09 MED ORDER — ESTRADIOL 0.01 % VA CREA: 0.5000 g | TOPICAL_CREAM | VAGINAL | 11 refills | Status: AC

## 2024-01-09 NOTE — Addendum Note (Signed)
 Addended by: Adali Pennings N on: 01/09/2024 02:07 PM   Modules accepted: Orders

## 2024-01-09 NOTE — Progress Notes (Signed)
 Patient wasn't aware she had to make an account so she never received her cream. Patient is calling back to provide us  with her email address.

## 2024-02-20 ENCOUNTER — Ambulatory Visit: Admitting: Obstetrics and Gynecology

## 2024-03-08 ENCOUNTER — Ambulatory Visit: Admitting: Obstetrics

## 2024-03-08 ENCOUNTER — Encounter: Payer: Self-pay | Admitting: Obstetrics

## 2024-03-08 VITALS — BP 137/80 | HR 87

## 2024-03-08 DIAGNOSIS — N3946 Mixed incontinence: Secondary | ICD-10-CM

## 2024-03-08 DIAGNOSIS — N813 Complete uterovaginal prolapse: Secondary | ICD-10-CM

## 2024-03-08 DIAGNOSIS — K5900 Constipation, unspecified: Secondary | ICD-10-CM | POA: Diagnosis not present

## 2024-03-08 DIAGNOSIS — Z4689 Encounter for fitting and adjustment of other specified devices: Secondary | ICD-10-CM

## 2024-03-08 DIAGNOSIS — N952 Postmenopausal atrophic vaginitis: Secondary | ICD-10-CM

## 2024-03-08 DIAGNOSIS — R351 Nocturia: Secondary | ICD-10-CM

## 2024-03-08 DIAGNOSIS — N3281 Overactive bladder: Secondary | ICD-10-CM

## 2024-03-08 DIAGNOSIS — N814 Uterovaginal prolapse, unspecified: Secondary | ICD-10-CM

## 2024-03-08 MED ORDER — GEMTESA 75 MG PO TABS: 75.0000 mg | ORAL_TABLET | Freq: Every day | ORAL | 2 refills | Status: AC

## 2024-03-08 MED ORDER — GEMTESA 75 MG PO TABS: 75.0000 mg | ORAL_TABLET | Freq: Every day | ORAL | Status: AC

## 2024-03-08 NOTE — Patient Instructions (Addendum)
 Please follow-up with gastroenterology referral and lung doctor referral.   You are opting to try a size 7 cube pessary. This will keep the bulge inside and prevent it from getting worse. You can learn how to remove it yourself or we can do that for you every 3 months.  - discussed risk of change in urinary or bowel symptoms, vaginal ulceration, discharge, bleeding, fistula formation. Explained that pt may require multiple sizes and types for fitting.   We discussed the symptoms of overactive bladder (OAB), which include urinary urgency, urinary frequency, night-time urination, with or without urge incontinence.  We discussed management including behavioral therapy (decreasing bladder irritants by following a bladder diet, urge suppression strategies, timed voids, bladder retraining), physical therapy, medication; and for refractory cases posterior tibial nerve stimulation, sacral neuromodulation, and intravesical botulinum toxin injection.   For Beta-3 agonist medication, we discussed the potential side effect of elevated blood pressure which is more likely to occur in individuals with uncontrolled hypertension. You were given prescription for Gemtesa  75 mg.  It can take a month to start working so give it time, but if you have bothersome side effects call sooner and we can try a different medication.  Call us  if you have trouble filling the prescription or if it's not covered by your insurance.

## 2024-03-08 NOTE — Assessment & Plan Note (Signed)
-   10/30/23 POCT UA + leuk/protein, catheterized UA micro + leuk only and culture > 100K E. Coli s/p macrobid . PVR 26mL - ambulates with walker, possible association with functional incontinence and consider bedside commode  - We discussed the symptoms of overactive bladder (OAB), which include urinary urgency, urinary frequency, nocturia, with or without urge incontinence.  While we do not know the exact etiology of OAB, several treatment options exist. We discussed management including behavioral therapy (decreasing bladder irritants, urge suppression strategies, timed voids, bladder retraining), physical therapy, medication; for refractory cases posterior tibial nerve stimulation, sacral neuromodulation, and intravesical botulinum toxin injection.  For anticholinergic medications, we discussed the potential side effects of anticholinergics including dry eyes, dry mouth, constipation, cognitive impairment and urinary retention. For Beta-3 agonist medication, we discussed the potential side effect of elevated blood pressure which is more likely to occur in individuals with uncontrolled hypertension. - encouraged fluid management and reduction of soda intake - reassess symptoms after size 7 cube pessary - previously declined medications, desires trial of medication. Samples and Rx provided for trial of Gemtesa  until pessary follow-up - avoid anti-cholinergic use due to h/o glaucoma

## 2024-03-08 NOTE — Assessment & Plan Note (Addendum)
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  We also discussed the importance of avoiding chronic straining, as it can exacerbate her pelvic floor symptoms; we discussed treating constipation and straining prior to surgery, as postoperative straining can lead to damage to the repair and recurrence of symptoms. We discussed initiating therapy with increasing fluid intake, fiber supplementation, stool softeners, and laxatives such as miralax.  - encouraged to resume titration of fiber supplementation or miralax to optimize stool consistency if difficulty with defecation returns with pessary trial - encourage squatting position for defecation to minimize straining and avoid pessary expulsion and discussed association with pelvic floor disorders - encouraged to follow-up with Dr. Wilhelmenia (GI) if she experiences additional blood when she wipes after bowel movements, if she is unable to establish care with GI in [place] with referral placed 10/2023

## 2024-03-08 NOTE — Assessment & Plan Note (Signed)
-   For symptomatic vaginal atrophy options include lubrication with a water-based lubricant, personal hygiene measures and barrier protection against wetness, and estrogen replacement in the form of vaginal cream, vaginal tablets, or a time-released vaginal ring.   - continue low dose vaginal estrogen

## 2024-03-08 NOTE — Assessment & Plan Note (Signed)
-   discomfort and expulsion with size 5 ring with support pessary in the setting of constipation - failed size 6 ring with support pessary, size 4 & 5 short stem gellhorn, size 4 & 5 donut, size 5 and 6 cube pessary - discussed risk of change in urinary or bowel symptoms, vaginal ulceration, discharge, bleeding, fistula formation. Explained that pt may require multiple sizes and types for fitting.  - continue low dose vaginal estrogen to reduce discomfort with pessary management

## 2024-03-08 NOTE — Progress Notes (Signed)
 [facility] Return Visit  SUBJECTIVE  History of Present Illness: [name] [name] is a 74 y.o. female seen in follow-up for stage III pelvic organ prolapse, mixed urinary incontinence, constipation, nocturia, vaginal irritation due to BV, and abnormal urinalysis. Plan at last visit was trial of size 4 short stem Gellhorn pessary, vaginal estrogen, lasix to 2pm, pulmonary and GI referral.   Presented with her daughter  #4 Short stem gellhorn pessary expelled on day of placement Denies vaginal bleeding or discharge. Failed size 6 ring with support pessary due to inability to defecate and discomfort. Failed size 5 ring with support pessary due to prolapse and pinching sensation  10/30/23 Nuswab positive for BV, Rx flagyl    Taking miralax PRN 1 week, BM 1x/day without straining.  Using 8mg  fiber gummies daily, ran out 1 week ago. Stopped senna use daily. Reports fecal smearing.  Continues to report blood in stool when she manually extract stool. Has not follow-up with GI, referral sent to [name] Stager, NP per request.   Rx Macrobid  for urine culture > 100K E. Coli resistant to cipro .  UUI 4x/week mostly at night with urgency  Voids 4x/day, 3x/night Drinks: 32oz water per day, 32-48oz cherry sundrop zero, intermittent coffee  Ambulates with walker  Past Medical History: Patient  has a past medical history of Anxiety, Asthma, Ectopic pregnancy, Fibromyalgia, Glaucoma, Hypertension, OSA (obstructive sleep apnea), and Rheumatoid arthritis(714.0).   Past Surgical History: She  has a past surgical history that includes Back surgery; Tubal ligation; Ectopic pregnancy surgery; vericose vein surgery; Knee arthroscopy; Cataract extraction; Cholecystectomy; Cervical laminectomy; Cataract extraction w/PHACO (01/16/2012); ERCP (N/A, 01/30/2022); Sphincterotomy (01/30/2022); removal of stones (01/30/2022); biliary stent placement (01/30/2022); Biliary dilation (01/30/2022); Stone extraction with  basket (01/30/2022); biopsy (01/30/2022); Endoscopic retrograde cholangiopancreatography (ercp) with propofol  (N/A, 04/07/2022); removal of stones (04/07/2022); Balloon dilation (N/A, 04/07/2022); and Stent removal (04/07/2022).   Medications: She has a current medication list which includes the following prescription(s): aspirin-acetaminophen -caffeine , butalbital -acetaminophen -caffeine , diltiazem , doxycycline, estradiol , fexofenadine, folic acid , furosemide, gabapentin, methocarbamol , opcon-a , omeprazole, ondansetron , oxygen-helium, gemtesa, and gemtesa.   Allergies: Patient is allergic to dolobid [diflunisal], hydrocodone-ibuprofen , biaxin [clarithromycin], lyrica [pregabalin], oxycontin  [oxycodone ], singulair [montelukast], and sulfamethoxazole -trimethoprim .   Social History: Patient  reports that she has never smoked. She has never used smokeless tobacco. She reports that she does not drink alcohol  and does not use drugs.     OBJECTIVE     Physical Exam: Vitals:   03/08/24 1148  BP: 137/80  Pulse: 87   Gen: No apparent distress, A&O x 3. Pelvic Exam: Normal external female genitalia; Bartholin's and Skene's glands normal in appearance; urethral meatus normal in appearance, no urethral masses or discharge.   A size 5 short stem gellhorn pessary was placed. It was comfortable, some descent with valsalva and removed without difficulty.   A size 4 donut pessary was placed. It was comfortable, stayed in place with valsalva and was an appropriate size on examination, with one finger fitting between the pessary and the vaginal walls. Pessary expelled when patient resumed seated position.  A size 5 donut pessary was placed. It was comfortable, however descended to introitus with valsalva. Patient removed with minimal discomfort.  A size 5 cube pessary was fitted. It was comfortable, stayed in place with valsalva and was an appropriate size on examination, with one finger fitting between the  pessary and the vaginal walls. Pessary expelled while sitting on the commode.   A size 6 cube pessary was fitted. It was comfortable, however descended  to introitus with valsalva. Pessary was removed without difficulty.   A size 7 cube pessary was fitted. It was comfortable, stayed in place with valsalva and was an appropriate size on examination, with one finger fitting between the pessary and the vaginal walls. Pessary remained in place while sitting on the commode.       ASSESSMENT AND PLAN    [name] [name] is a 74 y.o. with:  1. Cystocele with uterine prolapse   2. Vaginal atrophy   3. Encounter for fitting and adjustment of pessary   4. Constipation, unspecified constipation type   5. Urinary incontinence, mixed   6. Nocturia     Cystocele with uterine prolapse Assessment & Plan: - size 5 ring with support pessary managed in the office, reports discomfort with bending over and pinching sensation in addition to vaginal bulge past vaginal opening with pessary in place - failed size 6 ring with support pessary, size 4 & 5 short stem gellhorn, size 4 & 5 donut, size 5 and 6 cube pessaries - For treatment of pelvic organ prolapse, we discussed options for management including expectant management, conservative management, and surgical management, such as Kegels, a pessary, pelvic floor physical therapy, and specific surgical procedures. - continue low dose vaginal estrogen due to vaginal atrophy on exam to reduce discomfort with pessary use - trial of size 7 cube pessary, return in 4 weeks or sooner for pessary check to reassess symptoms - continue to optimize stool consistency and avoid straining during BM   Vaginal atrophy Assessment & Plan: - For symptomatic vaginal atrophy options include lubrication with a water-based lubricant, personal hygiene measures and barrier protection against wetness, and estrogen replacement in the form of vaginal cream, vaginal tablets, or a time-released  vaginal ring.   - continue low dose vaginal estrogen   Encounter for fitting and adjustment of pessary Assessment & Plan: - discomfort and expulsion with size 5 ring with support pessary in the setting of constipation - failed size 6 ring with support pessary, size 4 & 5 short stem gellhorn, size 4 & 5 donut, size 5 and 6 cube pessary - discussed risk of change in urinary or bowel symptoms, vaginal ulceration, discharge, bleeding, fistula formation. Explained that pt may require multiple sizes and types for fitting.  - continue low dose vaginal estrogen to reduce discomfort with pessary management   Constipation, unspecified constipation type Assessment & Plan: - BM 2-3x/week with Type II stool and straining since pessary use - tried OTC senna daily and dulcolax PO 3-4x/wk - prior BM 3-4x/week - LLQ and suprapubic pain resolves with defecation, reducible incisional hernia left of midline incision from ectopic pregnancy - For constipation, we reviewed the importance of a better bowel regimen.  We also discussed the importance of avoiding chronic straining, as it can exacerbate her pelvic floor symptoms; we discussed treating constipation and straining prior to surgery, as postoperative straining can lead to damage to the repair and recurrence of symptoms. We discussed initiating therapy with increasing fluid intake, fiber supplementation, stool softeners, and laxatives such as miralax.  - encouraged to resume titration of fiber supplementation or miralax to optimize stool consistency if difficulty with defecation returns with pessary trial - encourage squatting position for defecation to minimize straining and avoid pessary expulsion and discussed association with pelvic floor disorders - encouraged to follow-up with Dr. Wilhelmenia (GI) if she experiences additional blood when she wipes after bowel movements, if she is unable to establish care with GI in [place]  with referral placed  10/2023   Urinary incontinence, mixed Assessment & Plan: - 10/30/23 POCT UA + leuk/protein, catheterized UA micro + leuk only and culture > 100K E. Coli s/p macrobid . PVR 26mL - ambulates with walker, possible association with functional incontinence and consider bedside commode  - We discussed the symptoms of overactive bladder (OAB), which include urinary urgency, urinary frequency, nocturia, with or without urge incontinence.  While we do not know the exact etiology of OAB, several treatment options exist. We discussed management including behavioral therapy (decreasing bladder irritants, urge suppression strategies, timed voids, bladder retraining), physical therapy, medication; for refractory cases posterior tibial nerve stimulation, sacral neuromodulation, and intravesical botulinum toxin injection.  For anticholinergic medications, we discussed the potential side effects of anticholinergics including dry eyes, dry mouth, constipation, cognitive impairment and urinary retention. For Beta-3 agonist medication, we discussed the potential side effect of elevated blood pressure which is more likely to occur in individuals with uncontrolled hypertension. - encouraged fluid management and reduction of soda intake - reassess symptoms after size 7 cube pessary - previously declined medications, desires trial of medication. Samples and Rx provided for trial of Gemtesa until pessary follow-up - avoid anti-cholinergic use due to h/o glaucoma  Orders: [facility]; Take 1 tablet (75 mg total) by mouth daily.  Dispense: 30 tablet; Refill: 2  Nocturia Assessment & Plan: - avoid fluid intake 3 hours before bedtime - urgency at night time  - elevated feet during the day or use compression socks to reduce lower extremity swelling - switch diuretic PRN (e.g. furosemide) dosing to 2pm  - due to snoring and history of sleep apnea, encouraged follow-up with pulm regarding CPAP use - referral to pulm near  [place] per request, encouraged pt and her daughter to call for appt - trial of Gemtesa at bedtime   Orders: [facility] Ropes; Take 1 tablet (75 mg total) by mouth daily.  Dispense: 30 tablet; Refill: 2  Other orders -     Ropes; Take 1 tablet (75 mg total) by mouth daily. LOT: [id number] EXP: 02/2027  Time spent: I spent 43 minutes dedicated to the care of this patient on the date of this encounter to include pre-visit review of records, face-to-face time with the patient discussing stage III pelvic organ prolapse, mixed urinary incontinence, constipation, nocturia, vaginal atrophy, and post visit documentation and ordering medication/ testing.   Lianne ONEIDA Gillis, MD

## 2024-03-08 NOTE — Assessment & Plan Note (Addendum)
-   avoid fluid intake 3 hours before bedtime - urgency at night time  - elevated feet during the day or use compression socks to reduce lower extremity swelling - switch diuretic PRN (e.g. furosemide) dosing to 2pm  - due to snoring and history of sleep apnea, encouraged follow-up with pulm regarding CPAP use - referral to pulm near [place] per request, encouraged pt and her daughter to call for appt - trial of Gemtesa at bedtime

## 2024-03-08 NOTE — Assessment & Plan Note (Addendum)
-   size 5 ring with support pessary managed in the office, reports discomfort with bending over and pinching sensation in addition to vaginal bulge past vaginal opening with pessary in place - failed size 6 ring with support pessary, size 4 & 5 short stem gellhorn, size 4 & 5 donut, size 5 and 6 cube pessaries - For treatment of pelvic organ prolapse, we discussed options for management including expectant management, conservative management, and surgical management, such as Kegels, a pessary, pelvic floor physical therapy, and specific surgical procedures. - continue low dose vaginal estrogen due to vaginal atrophy on exam to reduce discomfort with pessary use - trial of size 7 cube pessary, return in 4 weeks or sooner for pessary check to reassess symptoms - continue to optimize stool consistency and avoid straining during BM

## 2024-03-12 ENCOUNTER — Ambulatory Visit: Admitting: Obstetrics and Gynecology

## 2024-04-03 ENCOUNTER — Ambulatory Visit: Admitting: Obstetrics

## 2024-05-08 ENCOUNTER — Ambulatory Visit: Admitting: Gastroenterology
